# Patient Record
Sex: Female | Born: 1950 | Race: Black or African American | Hispanic: No | Marital: Married | Smoking: Former smoker
Health system: Southern US, Community
[De-identification: ages and names within clinical notes are randomized; demographics above are authoritative.]

## PROBLEM LIST (undated history)

## (undated) DIAGNOSIS — I729 Aneurysm of unspecified site: Secondary | ICD-10-CM

## (undated) DIAGNOSIS — I10 Essential (primary) hypertension: Secondary | ICD-10-CM

## (undated) DIAGNOSIS — G4733 Obstructive sleep apnea (adult) (pediatric): Secondary | ICD-10-CM

## (undated) DIAGNOSIS — E559 Vitamin D deficiency, unspecified: Secondary | ICD-10-CM

## (undated) DIAGNOSIS — R0789 Other chest pain: Principal | ICD-10-CM

## (undated) DIAGNOSIS — Z9989 Dependence on other enabling machines and devices: Secondary | ICD-10-CM

## (undated) DIAGNOSIS — R000 Tachycardia, unspecified: Secondary | ICD-10-CM

## (undated) DIAGNOSIS — M8580 Other specified disorders of bone density and structure, unspecified site: Secondary | ICD-10-CM

## (undated) DIAGNOSIS — M1990 Unspecified osteoarthritis, unspecified site: Secondary | ICD-10-CM

## (undated) DIAGNOSIS — E876 Hypokalemia: Secondary | ICD-10-CM

## (undated) DIAGNOSIS — H269 Unspecified cataract: Secondary | ICD-10-CM

## (undated) DIAGNOSIS — N189 Chronic kidney disease, unspecified: Secondary | ICD-10-CM

## (undated) HISTORY — DX: Hypokalemia: E87.6

## (undated) HISTORY — DX: Vitamin D deficiency, unspecified: E55.9

## (undated) HISTORY — DX: Obstructive sleep apnea (adult) (pediatric): G47.33

## (undated) HISTORY — DX: Other chest pain: R07.89

## (undated) HISTORY — DX: Tachycardia, unspecified: R00.0

## (undated) HISTORY — DX: Essential (primary) hypertension: I10

## (undated) HISTORY — PX: COLONOSCOPY: SHX174

## (undated) HISTORY — DX: Other specified disorders of bone density and structure, unspecified site: M85.80

---
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
## 1998-12-28 ENCOUNTER — Other Ambulatory Visit: Admission: RE | Admit: 1998-12-28 | Discharge: 1998-12-28 | Payer: Self-pay | Admitting: Obstetrics & Gynecology

## 1999-12-27 ENCOUNTER — Emergency Department (HOSPITAL_COMMUNITY): Admission: EM | Admit: 1999-12-27 | Discharge: 1999-12-27 | Payer: Self-pay | Admitting: Internal Medicine

## 2001-07-06 ENCOUNTER — Other Ambulatory Visit: Admission: RE | Admit: 2001-07-06 | Discharge: 2001-07-06 | Payer: Self-pay | Admitting: *Deleted

## 2002-02-17 ENCOUNTER — Encounter: Payer: Self-pay | Admitting: Internal Medicine

## 2002-02-17 ENCOUNTER — Encounter: Admission: RE | Admit: 2002-02-17 | Discharge: 2002-02-17 | Payer: Self-pay | Admitting: Internal Medicine

## 2002-12-19 ENCOUNTER — Other Ambulatory Visit: Admission: RE | Admit: 2002-12-19 | Discharge: 2002-12-19 | Payer: Self-pay | Admitting: Obstetrics & Gynecology

## 2004-04-03 ENCOUNTER — Ambulatory Visit: Payer: Self-pay | Admitting: Internal Medicine

## 2004-10-30 ENCOUNTER — Ambulatory Visit: Payer: Self-pay | Admitting: Internal Medicine

## 2005-08-18 ENCOUNTER — Ambulatory Visit: Payer: Self-pay | Admitting: Internal Medicine

## 2005-08-22 ENCOUNTER — Ambulatory Visit (HOSPITAL_COMMUNITY): Admission: RE | Admit: 2005-08-22 | Discharge: 2005-08-22 | Payer: Self-pay | Admitting: Internal Medicine

## 2006-04-01 ENCOUNTER — Ambulatory Visit: Payer: Self-pay | Admitting: Internal Medicine

## 2006-04-01 ENCOUNTER — Ambulatory Visit (HOSPITAL_COMMUNITY): Admission: RE | Admit: 2006-04-01 | Discharge: 2006-04-01 | Payer: Self-pay | Admitting: Internal Medicine

## 2006-10-13 ENCOUNTER — Ambulatory Visit: Payer: Self-pay | Admitting: Internal Medicine

## 2006-10-13 LAB — CONVERTED CEMR LAB
ALT: 23 units/L (ref 0–40)
AST: 20 units/L (ref 0–37)
Albumin: 3.8 g/dL (ref 3.5–5.2)
Alkaline Phosphatase: 98 units/L (ref 39–117)
BUN: 9 mg/dL (ref 6–23)
Basophils Absolute: 0 10*3/uL (ref 0.0–0.1)
Basophils Relative: 0.1 % (ref 0.0–1.0)
Bilirubin Urine: NEGATIVE
Bilirubin, Direct: 0.1 mg/dL (ref 0.0–0.3)
CO2: 29 meq/L (ref 19–32)
Calcium: 9.4 mg/dL (ref 8.4–10.5)
Chloride: 102 meq/L (ref 96–112)
Cholesterol: 222 mg/dL (ref 0–200)
Creatinine, Ser: 0.9 mg/dL (ref 0.4–1.2)
Crystals: NEGATIVE
Direct LDL: 121.6 mg/dL
Eosinophils Absolute: 0.1 10*3/uL (ref 0.0–0.6)
Eosinophils Relative: 1.9 % (ref 0.0–5.0)
GFR calc Af Amer: 84 mL/min
GFR calc non Af Amer: 69 mL/min
Glucose, Bld: 85 mg/dL (ref 70–99)
HCT: 38.5 % (ref 36.0–46.0)
HDL: 71.2 mg/dL (ref >39.0)
Hemoglobin, Urine: NEGATIVE
Hemoglobin: 13.3 g/dL (ref 12.0–15.0)
Ketones, ur: NEGATIVE mg/dL
Lymphocytes Relative: 34.8 % (ref 12.0–46.0)
MCHC: 34.4 g/dL (ref 30.0–36.0)
MCV: 88.8 fL (ref 78.0–100.0)
Monocytes Absolute: 0.5 10*3/uL (ref 0.2–0.7)
Monocytes Relative: 7.5 % (ref 3.0–11.0)
Mucus, UA: NEGATIVE
Neutro Abs: 3.8 10*3/uL (ref 1.4–7.7)
Neutrophils Relative %: 55.7 % (ref 43.0–77.0)
Nitrite: NEGATIVE
Platelets: 402 10*3/uL — ABNORMAL HIGH (ref 150–400)
Potassium: 3.4 meq/L — ABNORMAL LOW (ref 3.5–5.1)
RBC / HPF: NONE SEEN
RBC: 4.34 M/uL (ref 3.87–5.11)
RDW: 13.6 % (ref 11.5–14.6)
Sodium: 138 meq/L (ref 135–145)
Specific Gravity, Urine: >=1.03 (ref 1.000–1.03)
TSH: 1.36 microintl units/mL (ref 0.35–5.50)
Total Bilirubin: 0.8 mg/dL (ref 0.3–1.2)
Total CHOL/HDL Ratio: 3.1
Total Protein, Urine: NEGATIVE mg/dL
Total Protein: 7.5 g/dL (ref 6.0–8.3)
Triglycerides: 77 mg/dL (ref 0–149)
Urine Glucose: NEGATIVE mg/dL
Urobilinogen, UA: 0.2 (ref 0.0–1.0)
VLDL: 15 mg/dL (ref 0–40)
WBC: 6.7 10*3/uL (ref 4.5–10.5)
pH: 5.5 (ref 5.0–8.0)

## 2007-02-10 ENCOUNTER — Encounter: Payer: Self-pay | Admitting: *Deleted

## 2007-02-10 DIAGNOSIS — T7840XA Allergy, unspecified, initial encounter: Secondary | ICD-10-CM | POA: Insufficient documentation

## 2007-02-10 DIAGNOSIS — D649 Anemia, unspecified: Secondary | ICD-10-CM | POA: Insufficient documentation

## 2007-02-10 DIAGNOSIS — I10 Essential (primary) hypertension: Secondary | ICD-10-CM | POA: Insufficient documentation

## 2007-04-19 ENCOUNTER — Telehealth (INDEPENDENT_AMBULATORY_CARE_PROVIDER_SITE_OTHER): Payer: Self-pay | Admitting: *Deleted

## 2007-04-26 ENCOUNTER — Ambulatory Visit: Payer: Self-pay | Admitting: Internal Medicine

## 2007-04-26 DIAGNOSIS — E7800 Pure hypercholesterolemia, unspecified: Secondary | ICD-10-CM | POA: Insufficient documentation

## 2007-04-26 DIAGNOSIS — F411 Generalized anxiety disorder: Secondary | ICD-10-CM | POA: Insufficient documentation

## 2007-04-26 DIAGNOSIS — K219 Gastro-esophageal reflux disease without esophagitis: Secondary | ICD-10-CM | POA: Insufficient documentation

## 2007-04-26 DIAGNOSIS — M79609 Pain in unspecified limb: Secondary | ICD-10-CM | POA: Insufficient documentation

## 2007-04-26 DIAGNOSIS — D509 Iron deficiency anemia, unspecified: Secondary | ICD-10-CM | POA: Insufficient documentation

## 2007-04-26 DIAGNOSIS — E785 Hyperlipidemia, unspecified: Secondary | ICD-10-CM | POA: Insufficient documentation

## 2007-10-08 ENCOUNTER — Emergency Department (HOSPITAL_COMMUNITY): Admission: EM | Admit: 2007-10-08 | Discharge: 2007-10-09 | Payer: Self-pay | Admitting: Emergency Medicine

## 2007-10-24 ENCOUNTER — Encounter: Admission: RE | Admit: 2007-10-24 | Discharge: 2007-10-24 | Payer: Self-pay | Admitting: Internal Medicine

## 2007-10-25 ENCOUNTER — Encounter: Payer: Self-pay | Admitting: Internal Medicine

## 2008-06-28 ENCOUNTER — Encounter: Payer: Self-pay | Admitting: Cardiovascular Disease

## 2008-10-04 ENCOUNTER — Encounter: Payer: Self-pay | Admitting: Cardiovascular Disease

## 2009-10-27 ENCOUNTER — Emergency Department (HOSPITAL_COMMUNITY): Admission: EM | Admit: 2009-10-27 | Discharge: 2009-10-27 | Payer: Self-pay | Admitting: Emergency Medicine

## 2009-10-28 ENCOUNTER — Inpatient Hospital Stay (HOSPITAL_COMMUNITY): Admission: EM | Admit: 2009-10-28 | Discharge: 2009-10-29 | Payer: Self-pay | Admitting: Emergency Medicine

## 2010-05-21 NOTE — Progress Notes (Signed)
       21  22  23  24  25   Phone Note Call from Patient Call back at Home Phone [phone]   Caller: Patient Summary of Call: [facility] a generic brand [facility] [address] [phone] Initial call taken by: Shelbie Proctor,  April 19, 2007 3:23 PM  Follow-up for Phone Call        needs rov Follow-up by: Corwin Levins MD,  April 19, 2007 4:10 PM  Additional Follow-up for Phone Call Additional follow up Details #1::        called pt to inform an appt is need ov appt made for jan 5 spoke with pt Additional Follow-up by: Shelbie Proctor,  April 19, 2007 4:25 PM

## 2010-05-21 NOTE — Assessment & Plan Note (Signed)
Summary: rov for med adjustment on diovan dd  Medications Added DICLOFENAC SODIUM 75 MG TBEC (DICLOFENAC SODIUM) Take 1 tablet by mouth twice a day DIOVAN HCT 160-12.5 MG TABS (VALSARTAN-HYDROCHLOROTHIAZIDE)  CLARINEX 5 MG  TABS (DESLORATADINE) 1 by mouth once daily prn ECOTRIN LOW STRENGTH 81 MG  TBEC (ASPIRIN) 1 by mouth qd LISINOPRIL-HYDROCHLOROTHIAZIDE 20-12.5 MG  TABS (LISINOPRIL-HYDROCHLOROTHIAZIDE) 1 by mouth qd      Allergies Added: NKDA  Vital Signs:  Patient Profile:   60 Years Old Female Weight:      185 pounds Temp:     97.1 degrees F oral Pulse rate:   84 / minute BP sitting:   135 / 89  (right arm) Cuff size:   regular  Pt. in pain?   no  Vitals Entered By: Maris Berger (April 26, 2007 8:38 AM)                  Chief Complaint:  med adjustments.  History of Present Illness: here b/c [employer] is making the diovan hct a $60 co-pay; overall doing well; also with several weeks for right LBP with recurrent RLE discomfort radiating below the knee, overall improved over the past wk without weakness or significant pain  Current Allergies (reviewed today): No known allergies  Updated/Current Medications (including changes made in today's visit):  CLARINEX 5 MG  TABS (DESLORATADINE) 1 by mouth once daily prn ECOTRIN LOW STRENGTH 81 MG  TBEC (ASPIRIN) 1 by mouth qd LISINOPRIL-HYDROCHLOROTHIAZIDE 20-12.5 MG  TABS (LISINOPRIL-HYDROCHLOROTHIAZIDE) 1 by mouth qd   Past Medical History:    Reviewed history from 02/10/2007 and no changes required:       ALLERGY (ICD-995.3)       HYPERTENSION (ICD-401.9)       ANEMIA (ICD-285.9)       Hyperlipidemia       GERD       hx of uterine fibroids       DJD       Anemia-iron deficiency       Anxiety  Past Surgical History:    Reviewed history and no changes required:       s/p benign breast biopsy   Family History:    Reviewed history and no changes required:       Family History Diabetes 1st degree  relative  Social History:    Reviewed history and no changes required:       Never Smoked       Alcohol use-no       married       no children   Risk Factors:  Tobacco use:  never Alcohol use:  no    Physical Exam  General:     Well-developed,well-nourished,in no acute distress; alert,appropriate and cooperative throughout examination Head:     Normocephalic and atraumatic without obvious abnormalities. No apparent alopecia or balding. Eyes:     No corneal or conjunctival inflammation noted. EOMI. Perrla. Nose:     External nasal examination shows no deformity or inflammation. Nasal mucosa are pink and moist without lesions or exudates. Mouth:     Oral mucosa and oropharynx without lesions or exudates.  Teeth in good repair. Neck:     No deformities, masses, or tenderness noted. Lungs:     Normal respiratory effort, chest expands symmetrically. Lungs are clear to auscultation, no crackles or wheezes. Heart:     Normal rate and regular rhythm. S1 and S2 normal without gallop, murmur, click, rub or other extra sounds.  Msk:     No deformity or scoliosis noted of thoracic or lumbar spine.   Extremities:     no edema Neurologic:     No cranial nerve deficits noted. Station and gait are normal. Plantar reflexes are down-going bilaterally. DTRs are symmetrical throughout. Sensory, motor and coordinative functions appear intact.    Impression & Recommendations:  Problem # 1:  HYPERTENSION (ICD-401.9)  The following medications were removed from the medication list:    Diovan Hct 160-12.5 Mg Tabs (Valsartan-hydrochlorothiazide)  Her updated medication list for this problem includes:    Lisinopril-hydrochlorothiazide 20-12.5 Mg Tabs (Lisinopril-hydrochlorothiazide) .Marland Kitchen... 1 by mouth qd  tx as above  Problem # 2:  LEG PAIN, RIGHT (ICD-729.5) c/w sciatica most likely, no wimproved and exam benign, educated and reassured, will cont to follow  Problem # 3:  ANXIETY  (ICD-300.00) o/w stable, cont off meds for now  Complete Medication List: 1)  Clarinex 5 Mg Tabs (Desloratadine) .Marland Kitchen.. 1 by mouth once daily prn 2)  Ecotrin Low Strength 81 Mg Tbec (Aspirin) .Marland Kitchen.. 1 by mouth qd 3)  Lisinopril-hydrochlorothiazide 20-12.5 Mg Tabs (Lisinopril-hydrochlorothiazide) .Marland Kitchen.. 1 by mouth qd   Patient Instructions: 1)  Check your Blood Pressure regularly. If it is above 140/90: you should make an appointment. 2)  Please schedule a follow-up appointment in 6 monthswith CPX labs 3)  Check your Blood Pressure regularly. If it is above 140/90: you should make an appointment.    Prescriptions: LISINOPRIL-HYDROCHLOROTHIAZIDE 20-12.5 MG  TABS (LISINOPRIL-HYDROCHLOROTHIAZIDE) 1 by mouth qd  #30 x 11   Entered and Authorized by:   Corwin Levins MD   Signed by:   Corwin Levins MD on 04/26/2007   Method used:   Electronically sent to ...       [facility]  [phone]*       [address]       [place], [place]  [address]       Ph: [phone] or [phone]       Fax: [phone]   RxID:   [id number]  ]

## 2010-05-21 NOTE — Miscellaneous (Signed)
Summary: Orders Update  Clinical Lists Changes  Orders: Added new Service order of No Show NS50 (NS50) - Signed 

## 2010-07-07 LAB — DIFFERENTIAL
Basophils Absolute: 0 10*3/uL (ref 0.0–0.1)
Basophils Relative: 0 % (ref 0–1)
Eosinophils Absolute: 0.1 10*3/uL (ref 0.0–0.7)
Eosinophils Relative: 3 % (ref 0–5)
Lymphocytes Relative: 37 % (ref 12–46)
Lymphs Abs: 1.6 10*3/uL (ref 0.7–4.0)
Monocytes Absolute: 0.4 10*3/uL (ref 0.1–1.0)
Monocytes Relative: 10 % (ref 3–12)
Neutro Abs: 2.2 10*3/uL (ref 1.7–7.7)
Neutrophils Relative %: 50 % (ref 43–77)

## 2010-07-07 LAB — CK TOTAL AND CKMB (NOT AT ARMC)
CK, MB: 0.7 ng/mL (ref 0.3–4.0)
CK, MB: 0.7 ng/mL (ref 0.3–4.0)
CK, MB: 0.7 ng/mL (ref 0.3–4.0)
Relative Index: INVALID (ref 0.0–2.5)
Relative Index: INVALID (ref 0.0–2.5)
Relative Index: INVALID (ref 0.0–2.5)
Total CK: 60 U/L (ref 7–177)
Total CK: 62 U/L (ref 7–177)
Total CK: 68 U/L (ref 7–177)

## 2010-07-07 LAB — CBC
HCT: 34.2 % — ABNORMAL LOW (ref 36.0–46.0)
HCT: 36.8 % (ref 36.0–46.0)
Hemoglobin: 11.4 g/dL — ABNORMAL LOW (ref 12.0–15.0)
Hemoglobin: 12.5 g/dL (ref 12.0–15.0)
MCH: 30.4 pg (ref 26.0–34.0)
MCH: 31.3 pg (ref 26.0–34.0)
MCHC: 33.3 g/dL (ref 30.0–36.0)
MCHC: 34 g/dL (ref 30.0–36.0)
MCV: 91.2 fL (ref 78.0–100.0)
MCV: 92 fL (ref 78.0–100.0)
Platelets: 203 10*3/uL (ref 150–400)
Platelets: 213 10*3/uL (ref 150–400)
RBC: 3.75 MIL/uL — ABNORMAL LOW (ref 3.87–5.11)
RBC: 4 MIL/uL (ref 3.87–5.11)
RDW: 14.4 % (ref 11.5–15.5)
RDW: 14.5 % (ref 11.5–15.5)
WBC: 4.1 10*3/uL (ref 4.0–10.5)
WBC: 4.3 10*3/uL (ref 4.0–10.5)

## 2010-07-07 LAB — BASIC METABOLIC PANEL
BUN: 8 mg/dL (ref 6–23)
BUN: 9 mg/dL (ref 6–23)
CO2: 26 mEq/L (ref 19–32)
CO2: 28 mEq/L (ref 19–32)
Calcium: 9.2 mg/dL (ref 8.4–10.5)
Calcium: 9.3 mg/dL (ref 8.4–10.5)
Chloride: 106 mEq/L (ref 96–112)
Chloride: 107 mEq/L (ref 96–112)
Creatinine, Ser: 0.95 mg/dL (ref 0.4–1.2)
Creatinine, Ser: 1.02 mg/dL (ref 0.4–1.2)
GFR calc Af Amer: >60 mL/min (ref >60)
GFR calc Af Amer: >60 mL/min (ref >60)
GFR calc non Af Amer: 56 mL/min — ABNORMAL LOW (ref >60)
GFR calc non Af Amer: >60 mL/min (ref >60)
Glucose, Bld: 91 mg/dL (ref 70–99)
Glucose, Bld: 92 mg/dL (ref 70–99)
Potassium: 3.8 mEq/L (ref 3.5–5.1)
Potassium: 3.9 mEq/L (ref 3.5–5.1)
Sodium: 137 mEq/L (ref 135–145)
Sodium: 142 mEq/L (ref 135–145)

## 2010-07-07 LAB — LIPID PANEL
Cholesterol: 153 mg/dL (ref 0–200)
HDL: 60 mg/dL (ref >39)
LDL Cholesterol: 82 mg/dL (ref 0–99)
Total CHOL/HDL Ratio: 2.6 RATIO
Triglycerides: 53 mg/dL (ref <150)
VLDL: 11 mg/dL (ref 0–40)

## 2010-07-07 LAB — TROPONIN I
Troponin I: 0.01 ng/mL (ref 0.00–0.06)
Troponin I: <0.01 ng/mL (ref 0.00–0.06)
Troponin I: <0.01 ng/mL (ref 0.00–0.06)

## 2010-07-07 LAB — POCT CARDIAC MARKERS
CKMB, poc: <1 ng/mL — ABNORMAL LOW (ref 1.0–8.0)
Myoglobin, poc: 71.4 ng/mL (ref 12–200)
Troponin i, poc: <0.05 ng/mL (ref 0.00–0.09)

## 2010-07-07 LAB — TSH: TSH: 2.814 u[IU]/mL (ref 0.350–4.500)

## 2010-11-07 ENCOUNTER — Other Ambulatory Visit: Payer: Self-pay | Admitting: Neurosurgery

## 2010-11-07 DIAGNOSIS — I729 Aneurysm of unspecified site: Secondary | ICD-10-CM

## 2010-11-09 ENCOUNTER — Other Ambulatory Visit: Payer: Self-pay

## 2010-12-02 ENCOUNTER — Ambulatory Visit
Admission: RE | Admit: 2010-12-02 | Discharge: 2010-12-02 | Disposition: A | Discharge status: Home / Self Care | Payer: Federal, State, Local not specified - PPO | Source: Ambulatory Visit | Attending: Neurosurgery | Admitting: Neurosurgery

## 2010-12-02 DIAGNOSIS — I729 Aneurysm of unspecified site: Secondary | ICD-10-CM

## 2011-01-16 LAB — COMPREHENSIVE METABOLIC PANEL
ALT: 26
AST: 20
Albumin: 3.6
Alkaline Phosphatase: 94
BUN: 15
CO2: 26
Calcium: 9.3
Chloride: 108
Creatinine, Ser: 1.01
GFR calc Af Amer: >60
GFR calc non Af Amer: 57 — ABNORMAL LOW
Glucose, Bld: 99
Potassium: 3.8
Sodium: 141
Total Bilirubin: 0.5
Total Protein: 6.3

## 2011-01-16 LAB — CBC
HCT: 36.7
Hemoglobin: 12.5
MCHC: 34
MCV: 89.5
Platelets: 278
RBC: 4.1
RDW: 14.7
WBC: 5.8

## 2011-01-16 LAB — DIFFERENTIAL
Basophils Absolute: 0
Basophils Relative: 0
Eosinophils Absolute: 0.2
Eosinophils Relative: 3
Lymphocytes Relative: 29
Lymphs Abs: 1.7
Monocytes Absolute: 0.6
Monocytes Relative: 10
Neutro Abs: 3.3
Neutrophils Relative %: 58

## 2011-01-16 LAB — POCT CARDIAC MARKERS
CKMB, poc: <1 — ABNORMAL LOW
Myoglobin, poc: 53.9
Operator id: 294511
Troponin i, poc: <0.05

## 2011-02-07 ENCOUNTER — Other Ambulatory Visit: Payer: Self-pay | Admitting: Cardiovascular Disease

## 2011-02-10 ENCOUNTER — Other Ambulatory Visit: Payer: Self-pay | Admitting: *Deleted

## 2011-02-11 MED ORDER — HYDROCHLOROTHIAZIDE 25 MG PO TABS: 25.0000 mg | ORAL_TABLET | Freq: Every day | ORAL | Status: DC

## 2011-02-11 NOTE — Telephone Encounter (Signed)
Fax Received. Refill Completed. Deann Mclaine Chowoe (M.A), Pt needs appointment then refill can be made   

## 2011-08-27 ENCOUNTER — Encounter: Payer: Self-pay | Admitting: *Deleted

## 2011-10-15 ENCOUNTER — Other Ambulatory Visit: Payer: Self-pay | Admitting: Neurosurgery

## 2011-10-15 DIAGNOSIS — I729 Aneurysm of unspecified site: Secondary | ICD-10-CM

## 2011-11-27 ENCOUNTER — Other Ambulatory Visit: Payer: Federal, State, Local not specified - PPO

## 2011-12-01 ENCOUNTER — Ambulatory Visit
Admission: RE | Admit: 2011-12-01 | Discharge: 2011-12-01 | Disposition: A | Discharge status: Home / Self Care | Payer: Federal, State, Local not specified - PPO | Source: Ambulatory Visit | Attending: Neurosurgery | Admitting: Neurosurgery

## 2011-12-01 DIAGNOSIS — I729 Aneurysm of unspecified site: Secondary | ICD-10-CM

## 2013-07-19 ENCOUNTER — Other Ambulatory Visit (HOSPITAL_COMMUNITY)
Admission: RE | Admit: 2013-07-19 | Discharge: 2013-07-19 | Disposition: A | Discharge status: Home / Self Care | Payer: Federal, State, Local not specified - PPO | Source: Ambulatory Visit | Attending: Internal Medicine | Admitting: Internal Medicine

## 2013-07-19 DIAGNOSIS — Z01419 Encounter for gynecological examination (general) (routine) without abnormal findings: Secondary | ICD-10-CM | POA: Insufficient documentation

## 2014-12-12 ENCOUNTER — Encounter: Payer: Self-pay | Admitting: Cardiovascular Disease

## 2015-05-22 ENCOUNTER — Other Ambulatory Visit (HOSPITAL_COMMUNITY): Payer: Self-pay | Admitting: Internal Medicine

## 2015-05-22 DIAGNOSIS — N183 Chronic kidney disease, stage 3 unspecified: Secondary | ICD-10-CM

## 2015-05-22 DIAGNOSIS — M6281 Muscle weakness (generalized): Secondary | ICD-10-CM

## 2015-05-22 DIAGNOSIS — I998 Other disorder of circulatory system: Secondary | ICD-10-CM

## 2015-05-22 DIAGNOSIS — I671 Cerebral aneurysm, nonruptured: Secondary | ICD-10-CM

## 2015-05-23 ENCOUNTER — Ambulatory Visit (HOSPITAL_COMMUNITY)
Admission: RE | Admit: 2015-05-23 | Discharge: 2015-05-23 | Disposition: A | Discharge status: Home / Self Care | Payer: Federal, State, Local not specified - PPO | Source: Ambulatory Visit | Attending: Internal Medicine | Admitting: Internal Medicine

## 2015-05-23 ENCOUNTER — Other Ambulatory Visit (HOSPITAL_COMMUNITY): Payer: Self-pay | Admitting: Internal Medicine

## 2015-05-23 DIAGNOSIS — R531 Weakness: Secondary | ICD-10-CM | POA: Insufficient documentation

## 2015-05-23 DIAGNOSIS — I671 Cerebral aneurysm, nonruptured: Secondary | ICD-10-CM | POA: Diagnosis present

## 2015-05-23 DIAGNOSIS — N183 Chronic kidney disease, stage 3 unspecified: Secondary | ICD-10-CM

## 2015-05-23 DIAGNOSIS — G459 Transient cerebral ischemic attack, unspecified: Secondary | ICD-10-CM | POA: Diagnosis not present

## 2015-05-23 DIAGNOSIS — R200 Anesthesia of skin: Secondary | ICD-10-CM | POA: Diagnosis not present

## 2015-05-23 DIAGNOSIS — M6281 Muscle weakness (generalized): Secondary | ICD-10-CM

## 2015-05-23 DIAGNOSIS — I998 Other disorder of circulatory system: Secondary | ICD-10-CM

## 2015-05-24 ENCOUNTER — Ambulatory Visit (INDEPENDENT_AMBULATORY_CARE_PROVIDER_SITE_OTHER): Payer: Federal, State, Local not specified - PPO | Admitting: Neurology

## 2015-05-24 ENCOUNTER — Encounter: Payer: Self-pay | Admitting: Neurology

## 2015-05-24 VITALS — BP 130/75 | HR 60 | Ht 64.0 in | Wt 184.2 lb

## 2015-05-24 DIAGNOSIS — R531 Weakness: Secondary | ICD-10-CM

## 2015-05-24 NOTE — Progress Notes (Signed)
[facility]    Provider:  Dr Jaynee Eagles Referring Provider: Biagio Borg, MD Primary Care Physician:  Horatio Pel, MD  CC:  Episodic right-sided weakness  HPI:  Christine Steele is a 65 y.o. female here as a referral from Dr. Jenny Reichmann for right-sided feelings of numbness and weakness however she denies actually being weak, just feels like it. PMHx of HTN, untreated OSA, stable small mca aneurysm.  She has been having symptoms since 2011, husband endorses she has had these symptoms for years.For a long time her blood pressure would be very low when these episodes would happen but that has not happened since they changed her medications  She has had several MRIs and MRAs and they have been stable. She feels weak and numb on the right side. Mostly at night when she gets home and lays on the couch. She says that she works hard without a break. Started again on Sunday night after she took the metoprolol and laid down and that is when she started feeling bad. It may have happened after taking the metoprolol. She has had problems with BP and HR fluctuating. She never wakes up with the weakness. She starts feeling very weak on the right side from the face down to the leg. She has had episodes of low blood pressure in the past, but she checked her BP and it was fine 133  But last night symptoms were on the left side. The right side of the face feels funny. Her face feels funny but denies actual weakness or facial droop. Her husband doesn't notice a difference or sees any weakness. Patient goes and lays down. She can use all her limbs, no weakness but she just feels weak. No vision changes, no speech problems, when she is driving she feels fatigued. Symptoms are not that often. It happens when she is tired or stressed. Majority of time it is on the right side but it is sometimes on the left as well. She feels funny in the stomach. Husband is here, says she doesn't eat any meat and she eats mostly  vegetables. Happened Sunday nioght, Thursday night, Friday night. Each time it was after she had lays down and had already taken her medicine. She can't give me details. She has a funny feeling on her head like water running inside her head.  She doesn't take her pulse. Husband says a lot of things in the family have happened and she worries.   Reviewed notes, labs and imaging from outside physicians, which showed:  Personally reviewed MRI of the brain/MRA of the head 05/2015 and agree with the following. They are normal for her age:  IMPRESSION: 1. No acute intracranial abnormality. 2. Unchanged, minimal cerebral white matter disease which is nonspecific but may reflect the sequelae of chronic small vessel ischemia. 3. Unchanged 2 mm right MCA aneurysm. 4. No major intracranial arterial occlusion or significant stenosis.  CK nml  Review of Systems: Patient complains of symptoms per HPI as well as the following symptoms: swelling in hands, blured vision, eye pain, joint pain, numbness, weakness, snoring, joint pain, allergies. Pertinent negatives per HPI. All others negative.   Social History   Social History  . Marital Status: Married    Spouse Name: Trilby Drummer  . Number of Children: 0  . Years of Education: 12+   Occupational History  . Not on file.   Social History Main Topics  . Smoking status: Former Smoker -- 1.00 packs/day for 5 years  Types: Cigarettes    Quit date: 04/22/1979  . Smokeless tobacco: Not on file  . Alcohol Use: No  . Drug Use: No  . Sexual Activity: Not on file   Other Topics Concern  . Not on file   Social History Narrative   Lives with husband   Caffeine use: none    Family History  Problem Relation Age of Onset  . Hypertension Mother   . Heart attack Father   . Hypertension Father   . Stroke Father     Past Medical History  Diagnosis Date  . Hypertension   . Tachycardia   . Potassium (K) deficiency   . Vitamin D deficiency     Past  Surgical History  Procedure Laterality Date  . Biopsy breast  1970    Right Breast    Current Outpatient Prescriptions  Medication Sig Dispense Refill  . aspirin 325 MG tablet Take 325 mg by mouth daily.    . bisoprolol (ZEBETA) 5 MG tablet Take 5 mg by mouth at bedtime.    . Cholecalciferol (VITAMIN D PO) Take 2,000 Units by mouth daily.    . irbesartan-hydrochlorothiazide (AVALIDE) 300-12.5 MG tablet Take 1 tablet by mouth daily.    . Multiple Vitamins-Minerals (MULTIVITAMIN ADULT PO) Take 1 tablet by mouth daily.     No current facility-administered medications for this visit.    Allergies as of 05/24/2015 - Review Complete 05/24/2015  Allergen Reaction Noted  . Amlodipine  05/24/2015    Vitals: BP 130/75 mmHg  Pulse 60  Ht 5\' 4"  (1.626 m)  Wt 184 lb 3.2 oz (83.553 kg)  BMI 31.60 kg/m2 Last Weight:  Wt Readings from Last 1 Encounters:  05/24/15 184 lb 3.2 oz (83.553 kg)   Last Height:   Ht Readings from Last 1 Encounters:  05/24/15 5\' 4"  (1.626 m)   Physical exam: Exam: Gen: NAD, conversant, well nourised, obese, well groomed                     CV: RRR, no MRG. No Carotid Bruits. No peripheral edema, warm, nontender Eyes: Conjunctivae clear without exudates or hemorrhage  Neuro: Detailed Neurologic Exam  Speech:    Speech is normal; fluent and spontaneous with normal comprehension.  Cognition:    The patient is oriented to person, place, and time;     recent and remote memory intact;     language fluent;     normal attention, concentration,     fund of knowledge Cranial Nerves:    The pupils are equal, round, and reactive to light. The fundi are normal and spontaneous venous pulsations are present. Visual fields are full to finger confrontation. Extraocular movements are intact. Trigeminal sensation is intact and the muscles of mastication are normal. The face is symmetric. The palate elevates in the midline. Hearing intact. Voice is normal. Shoulder shrug is  normal. The tongue has normal motion without fasciculations.   Coordination:    Normal finger to nose and heel to shin. Normal rapid alternating movements.   Gait:    Heel-toe and tandem gait are normal.   Motor Observation:    No asymmetry, no atrophy, and no involuntary movements noted. Tone:    Normal muscle tone.    Posture:    Posture is normal. normal erect    Strength:    Strength is V/V in the upper and lower limbs.      Sensation: intact to LT     Reflex Exam:  DTR's:    Deep tendon reflexes in the upper and lower extremities are normal bilaterally.   Toes:    The toes are downgoing bilaterally.   Clonus:    Clonus is absent.       Assessment/Plan:  Patient with 6 years of occasional unilateral mild feeling of weakness, mostly right side but can also be on the left side too. Mostly at night when she is tired and possibly stressed. She has had multiple MRIs over the years that have been unremarkable and normal for her age, stable small right MCA aneurysm. These episodes only happen at night when she lays down. Neuro exam is normal. I discussed with patient and her husband, could MRI her neck but doesn't fit for cervical stenosis or cervical cord lesion, still wiling to verify. The symptoms have occurred on both sides so would be unusual for seizure but offered an EEG. Unusual for TIAs. Happy to do more workup but patient and her husband decline. Husband has never noticed any of the symptoms she describes even if he is with her at the time. She does endorse that it may be medication and in the past the symptoms have been exacerbated by changes in BP and pulse. I recommended keeping a diary of when the symptoms occur, how long they last, what medications or food she takes before the incident, what she was doing that day and how fatigued she feels, and check both blood pressure and pulse laying and standing. She is mildly orthostatic in the office today. Advised to drink plenty  of fluids.  Follow up in 6 months.  Laying    130/75   Pulse 60 Sitting     115/72   Pulse 59 Standing 124/70   Pulse 52  Sarina Ill, MD  [facility] [address] [place], [place] [address]  Phone [phone] Fax [phone]

## 2015-05-24 NOTE — Patient Instructions (Signed)
Overall you are doing very well but I do want to suggest a few things today:   Remember to drink plenty of fluid, eat healthy meals and do not skip any meals. Try to eat protein with a every meal and eat a healthy snack such as fruit or nuts in between meals. Try to keep a regular sleep-wake schedule and try to exercise daily, particularly in the form of walking, 20-30 minutes a day, if you can.   I would like to see you back in 6 months, sooner if we need to. Please call us with any interim questions, concerns, problems, updates or refill requests.   Our phone number is [phone]. We also have an after hours call service for urgent matters and there is a physician on-call for urgent questions. For any emergencies you know to call 911 or go to the nearest emergency room   

## 2015-05-26 DIAGNOSIS — R531 Weakness: Secondary | ICD-10-CM | POA: Insufficient documentation

## 2015-11-13 ENCOUNTER — Encounter: Payer: Self-pay | Admitting: Obstetrics & Gynecology

## 2015-11-21 ENCOUNTER — Ambulatory Visit: Payer: Federal, State, Local not specified - PPO | Admitting: Neurology

## 2015-11-28 ENCOUNTER — Encounter: Payer: Self-pay | Admitting: Neurology

## 2015-11-28 ENCOUNTER — Ambulatory Visit (INDEPENDENT_AMBULATORY_CARE_PROVIDER_SITE_OTHER): Payer: Federal, State, Local not specified - PPO | Admitting: Neurology

## 2015-11-28 VITALS — BP 130/80 | HR 64 | Ht 64.0 in | Wt 178.2 lb

## 2015-11-28 DIAGNOSIS — E538 Deficiency of other specified B group vitamins: Secondary | ICD-10-CM | POA: Diagnosis not present

## 2015-11-28 DIAGNOSIS — R5382 Chronic fatigue, unspecified: Secondary | ICD-10-CM | POA: Diagnosis not present

## 2015-11-28 DIAGNOSIS — R202 Paresthesia of skin: Secondary | ICD-10-CM

## 2015-11-28 DIAGNOSIS — R7309 Other abnormal glucose: Secondary | ICD-10-CM

## 2015-11-28 DIAGNOSIS — R531 Weakness: Secondary | ICD-10-CM | POA: Diagnosis not present

## 2015-11-28 NOTE — Progress Notes (Signed)
[id number]    Provider:  Dr Jaynee Eagles Referring Provider: [name], MD Primary Care Physician:  Horatio Pel, MD  CC:  Episodic right-sided weakness  Interval update 11/28/2015:  She returns for follow up. She has not really had anymore episode. Her blood pressure was elevated and her heart rate was low. It made her feel bad. She had some right sided weakness and she went to bed and it resolved. She not drink water consistently. She was minimally orthostatic last appointment. She often doesn;t drink any water during the day. At night she drinks more. With the weakness she doesn't feel dizzy or lightheaded but helps to lay down. Sheis more tired at the end of the day. No double vision, blurry vision, eyelid drooping. Will check labs for myasthenia gravis. She feels tingling in the feet and arm and radiates down the entire right side. She describes funny paresthesias.   HPI:  Christine Steele is a 65 y.o. female here as a referral from Dr. Jenny Reichmann for right-sided feelings of numbness and weakness however she denies actually being weak, just feels like it. PMHx of HTN, untreated OSA, stable small mca aneurysm.  She has been having symptoms since 2011, husband endorses she has had these symptoms for years.For a long time her blood pressure would be very low when these episodes would happen but that has not happened since they changed her medications  She has had several MRIs and MRAs and they have been stable. She feels weak and numb on the right side. Mostly at night when she gets home and lays on the couch. She says that she works hard without a break. Started again on Sunday night after she took the metoprolol and laid down and that is when she started feeling bad. It may have happened after taking the metoprolol. She has had problems with BP and HR fluctuating. She never wakes up with the weakness. She starts feeling very weak on the right side from the face down to the leg. She  has had episodes of low blood pressure in the past, but she checked her BP and it was fine 133  But last night symptoms were on the left side. The right side of the face feels funny. Her face feels funny but denies actual weakness or facial droop. Her husband doesn't notice a difference or sees any weakness. Patient goes and lays down. She can use all her limbs, no weakness but she just feels weak. No vision changes, no speech problems, when she is driving she feels fatigued. Symptoms are not that often. It happens when she is tired or stressed. Majority of time it is on the right side but it is sometimes on the left as well. She feels funny in the stomach. Husband is here, says she doesn't eat any meat and she eats mostly vegetables. Happened Sunday nioght, Thursday night, Friday night. Each time it was after she had lays down and had already taken her medicine. She can't give me details. She has a funny feeling on her head like water running inside her head.  She doesn't take her pulse. Husband says a lot of things in the family have happened and she worries.   Reviewed notes, labs and imaging from outside physicians, which showed:  Personally reviewed MRI of the brain/MRA of the head 05/2015 and agree with the following. They are normal for her age:  IMPRESSION: 1. No acute intracranial abnormality. 2. Unchanged, minimal cerebral white matter disease which is  nonspecific but may reflect the sequelae of chronic small vessel ischemia. 3. Unchanged 2 mm right MCA aneurysm. 4. No major intracranial arterial occlusion or significant stenosis.  CK nml  Review of Systems: Patient complains of symptoms per HPI as well as the following symptoms: swelling in hands, blured vision, eye pain, joint pain, numbness, weakness, snoring, joint pain, allergies. Pertinent negatives per HPI. All others negative.    Social History   Social History  . Marital status: Married    Spouse name: Trilby Drummer  . Number  of children: 0  . Years of education: 12+   Occupational History  . Not on file.   Social History Main Topics  . Smoking status: Former Smoker    Packs/day: 1.00    Years: 5.00    Types: Cigarettes    Quit date: 04/22/1979  . Smokeless tobacco: Not on file  . Alcohol use No  . Drug use: No  . Sexual activity: Not on file   Other Topics Concern  . Not on file   Social History Narrative   Lives with husband   Caffeine use: none    Family History  Problem Relation Age of Onset  . Hypertension Mother   . Heart attack Father   . Hypertension Father   . Stroke Father     Past Medical History:  Diagnosis Date  . Hypertension   . Potassium (K) deficiency   . Tachycardia   . Vitamin D deficiency     Past Surgical History:  Procedure Laterality Date  . BIOPSY BREAST  1970   Right Breast    Current Outpatient Prescriptions  Medication Sig Dispense Refill  . aspirin 325 MG tablet Take 325 mg by mouth daily.    . bisoprolol (ZEBETA) 5 MG tablet Take 2.5 mg by mouth at bedtime.     . Cholecalciferol (VITAMIN D PO) Take 2,000 Units by mouth daily.    . irbesartan-hydrochlorothiazide (AVALIDE) 300-12.5 MG tablet Take 0.5 tablets by mouth daily.     . Multiple Vitamins-Minerals (MULTIVITAMIN ADULT PO) Take 1 tablet by mouth daily.     No current facility-administered medications for this visit.     Allergies as of 11/28/2015 - Review Complete 11/28/2015  Allergen Reaction Noted  . Amlodipine  05/24/2015    Vitals: BP 130/80 (BP Location: Left Arm, Patient Position: Sitting)   Pulse 64   Ht 5\' 4"  (1.626 m)   Wt 178 lb 3.2 oz (80.8 kg)   SpO2 98%   BMI 30.59 kg/m  Last Weight:  Wt Readings from Last 1 Encounters:  11/28/15 178 lb 3.2 oz (80.8 kg)   Last Height:   Ht Readings from Last 1 Encounters:  11/28/15 5\' 4"  (1.626 m)   Speech:    Speech is normal; fluent and spontaneous with normal comprehension.  Cognition:    The patient is oriented to person,  place, and time;     recent and remote memory intact;     language fluent;     normal attention, concentration,     fund of knowledge Cranial Nerves:    The pupils are equal, round, and reactive to light. The fundi are normal and spontaneous venous pulsations are present. Visual fields are full to finger confrontation. Extraocular movements are intact. Trigeminal sensation is intact and the muscles of mastication are normal. The face is symmetric. The palate elevates in the midline. Hearing intact. Voice is normal. Shoulder shrug is normal. The tongue has normal motion without fasciculations.  Coordination:    Normal finger to nose and heel to shin. Normal rapid alternating movements.   Gait:    Heel-toe and tandem gait are normal.   Motor Observation:    No asymmetry, no atrophy, and no involuntary movements noted. Tone:    Normal muscle tone.    Posture:    Posture is normal. normal erect    Strength:    Strength is V/V in the upper and lower limbs.      Sensation: intact to LT     Reflex Exam:  DTR's:    Deep tendon reflexes in the upper and lower extremities are normal bilaterally.   Toes:    The toes are downgoing bilaterally.   Clonus:    Clonus is absent.       Assessment/Plan:  Patient with 6 years of occasional unilateral mild feeling of weakness, mostly right side but can also be on the left side too. Mostly at night when she is tired and possibly stressed. She has had multiple MRIs over the years that have been unremarkable and normal for her age, stable small right MCA aneurysm. These episodes only happen at night when she lays down. Neuro exam is normal. I discussed with patient and her husband, could MRI her neck but doesn't fit for cervical stenosis or cervical cord lesion, still wiling to verify. The symptoms have occurred on both sides so would be unusual for seizure but offered an EEG. Unusual for TIAs. Happy to do more workup but patient and her  husband decline. Husband has never noticed any of the symptoms she describes even if he is with her at the time. She does endorse that it may be medication and in the past the symptoms have been exacerbated by changes in BP and pulse. I recommended keeping a diary of when the symptoms occur, how long they last, what medications or food she takes before the incident, what she was doing that day and how fatigued she feels, and check both blood pressure and pulse laying and standing. She is mildly orthostatic in the office today. Advised to drink plenty of fluids.  Follow up in 6 months.  Will order labs today such as antibodies for myasthenia gravis, also reprots tingling, numbness with check a serum panel such as b12, tsh for fatigue, ck for muscle enzymes and others    Sarina Ill, MD  [facility] [address] [place], [place] [address]  Phone [phone] Fax [phone]  A total of 30 minutes was spent face-to-face with this patient. Over half this time was spent on counseling patient on the weakness diagnosis and different diagnostic and therapeutic options available.

## 2015-11-28 NOTE — Patient Instructions (Signed)
Overall you are doing fairly well but I do want to suggest a few things today:   Remember to drink plenty of fluid, eat healthy meals and do not skip any meals. Try to eat protein with a every meal and eat a healthy snack such as fruit or nuts in between meals. Try to keep a regular sleep-wake schedule and try to exercise daily, particularly in the form of walking, 20-30 minutes a day, if you can.   As far as diagnostic testing: labs  I would like to see you back as needed, sooner if we need to. Please call us with any interim questions, concerns, problems, updates or refill requests.   Our phone number is [phone]. We also have an after hours call service for urgent matters and there is a physician on-call for urgent questions. For any emergencies you know to call 911 or go to the nearest emergency room

## 2015-12-02 LAB — METHYLMALONIC ACID, SERUM: Methylmalonic Acid: 194 nmol/L (ref 0–378)

## 2015-12-02 LAB — CK: Total CK: 91 U/L (ref 24–173)

## 2015-12-02 LAB — ACETYLCHOLINE RECEPTOR, BINDING: AChR Binding Ab, Serum: 0.07 nmol/L (ref 0.00–0.24)

## 2015-12-02 LAB — ACETYLCHOLINE RECEPTOR, MODULATING: Acetylcholine Modulat Ab: <12 % (ref 0–20)

## 2015-12-02 LAB — HEMOGLOBIN A1C
Est. average glucose Bld gHb Est-mCnc: 120 mg/dL
Hgb A1c MFr Bld: 5.8 % — ABNORMAL HIGH (ref 4.8–5.6)

## 2015-12-02 LAB — ACETYLCHOLINE RECEPTOR, BLOCKING: Acetylchol Block Ab: 17 % (ref 0–25)

## 2015-12-02 LAB — TSH: TSH: 1.48 u[IU]/mL (ref 0.450–4.500)

## 2015-12-04 ENCOUNTER — Telehealth: Payer: Self-pay | Admitting: *Deleted

## 2015-12-04 NOTE — Telephone Encounter (Signed)
-----   Message from Melvenia Beam, MD sent at 12/03/2015  9:25 AM EDT ----- All labs normal except hgba1c which is 5.8 indicating pre-diabetes. I recommend weight loss and diet modification. thanks

## 2015-12-04 NOTE — Telephone Encounter (Signed)
Called and spoke to pt about lab results per Dr Jaynee Eagles note. Pt verbalized understanding.

## 2017-01-12 ENCOUNTER — Other Ambulatory Visit: Payer: Self-pay | Admitting: Obstetrics & Gynecology

## 2017-01-12 DIAGNOSIS — Z1239 Encounter for other screening for malignant neoplasm of breast: Secondary | ICD-10-CM

## 2017-01-14 DIAGNOSIS — Z23 Encounter for immunization: Secondary | ICD-10-CM | POA: Diagnosis not present

## 2017-01-14 DIAGNOSIS — E559 Vitamin D deficiency, unspecified: Secondary | ICD-10-CM | POA: Diagnosis not present

## 2017-01-14 DIAGNOSIS — I10 Essential (primary) hypertension: Secondary | ICD-10-CM | POA: Diagnosis not present

## 2017-01-19 DIAGNOSIS — M8580 Other specified disorders of bone density and structure, unspecified site: Secondary | ICD-10-CM

## 2017-01-19 HISTORY — DX: Other specified disorders of bone density and structure, unspecified site: M85.80

## 2017-01-28 ENCOUNTER — Ambulatory Visit
Admission: RE | Admit: 2017-01-28 | Discharge: 2017-01-28 | Disposition: A | Discharge status: Home / Self Care | Payer: Federal, State, Local not specified - PPO | Source: Ambulatory Visit | Attending: Obstetrics & Gynecology | Admitting: Obstetrics & Gynecology

## 2017-01-28 ENCOUNTER — Encounter: Payer: Self-pay | Admitting: Radiology

## 2017-01-28 DIAGNOSIS — Z1239 Encounter for other screening for malignant neoplasm of breast: Secondary | ICD-10-CM

## 2017-01-28 DIAGNOSIS — Z1231 Encounter for screening mammogram for malignant neoplasm of breast: Secondary | ICD-10-CM | POA: Diagnosis not present

## 2017-02-02 ENCOUNTER — Ambulatory Visit (INDEPENDENT_AMBULATORY_CARE_PROVIDER_SITE_OTHER): Payer: Medicare Other | Admitting: Obstetrics & Gynecology

## 2017-02-02 ENCOUNTER — Encounter: Payer: Self-pay | Admitting: Obstetrics & Gynecology

## 2017-02-02 VITALS — BP 140/70 | Ht 63.0 in | Wt 183.0 lb

## 2017-02-02 DIAGNOSIS — Z780 Asymptomatic menopausal state: Secondary | ICD-10-CM

## 2017-02-02 DIAGNOSIS — N950 Postmenopausal bleeding: Secondary | ICD-10-CM

## 2017-02-02 DIAGNOSIS — N930 Postcoital and contact bleeding: Secondary | ICD-10-CM

## 2017-02-02 DIAGNOSIS — N952 Postmenopausal atrophic vaginitis: Secondary | ICD-10-CM

## 2017-02-02 DIAGNOSIS — Z124 Encounter for screening for malignant neoplasm of cervix: Secondary | ICD-10-CM

## 2017-02-02 DIAGNOSIS — Z01411 Encounter for gynecological examination (general) (routine) with abnormal findings: Secondary | ICD-10-CM

## 2017-02-02 NOTE — Progress Notes (Signed)
Christine Steele 1950-11-14 [id number]   History:    66 y.o. G2P1A1L1  RP:  Established patient presenting for annual gyn exam   HPI:  Menopause.  No HRT.  Had mild spotting last January 2018 and mild postcoital bleeding about 2 weeks ago.  No sexual activity since then.  No pelvic pain.  Had an EBx which showed benign endometrium in 2016.  Breasts wnl.  Mictions/BMs wnl.   Past medical history,surgical history, family history and social history were all reviewed and documented in the EPIC chart.  Gynecologic History No LMP recorded. Patient is postmenopausal. Contraception: post menopausal status Last Pap: 10/2015. Results were: Neg/HPV HR neg Last mammogram: 01/28/2017. Results were: Negative Colono 2009 No recent Dexa  Obstetric History OB History  Gravida Para Term Preterm AB Living  2 1     1 1   SAB TAB Ectopic Multiple Live Births  1            # Outcome Date GA Lbr Len/2nd Weight Sex Delivery Anes PTL Lv  2 SAB           1 Para                ROS: A ROS was performed and pertinent positives and negatives are included in the history.  GENERAL: No fevers or chills. HEENT: No change in vision, no earache, sore throat or sinus congestion. NECK: No pain or stiffness. CARDIOVASCULAR: No chest pain or pressure. No palpitations. PULMONARY: No shortness of breath, cough or wheeze. GASTROINTESTINAL: No abdominal pain, nausea, vomiting or diarrhea, melena or bright red blood per rectum. GENITOURINARY: No urinary frequency, urgency, hesitancy or dysuria. MUSCULOSKELETAL: No joint or muscle pain, no back pain, no recent trauma. DERMATOLOGIC: No rash, no itching, no lesions. ENDOCRINE: No polyuria, polydipsia, no heat or cold intolerance. No recent change in weight. HEMATOLOGICAL: No anemia or easy bruising or bleeding. NEUROLOGIC: No headache, seizures, numbness, tingling or weakness. PSYCHIATRIC: No depression, no loss of interest in normal activity or change in sleep pattern.      Exam:   BP 140/70   Ht 5\' 3"  (1.6 m)   Wt 183 lb (83 kg)   BMI 32.42 kg/m   Body mass index is 32.42 kg/m.  General appearance : Well developed well nourished female. No acute distress HEENT: Eyes: no retinal hemorrhage or exudates,  Neck supple, trachea midline, no carotid bruits, no thyroidmegaly Lungs: Clear to auscultation, no rhonchi or wheezes, or rib retractions  Heart: Regular rate and rhythm, no murmurs or gallops Breast:Examined in sitting and supine position were symmetrical in appearance, no palpable masses or tenderness,  no skin retraction, no nipple inversion, no nipple discharge, no skin discoloration, no axillary or supraclavicular lymphadenopathy Abdomen: no palpable masses or tenderness, no rebound or guarding Extremities: no edema or skin discoloration or tenderness  Pelvic: Vulva normal  Bartholin, Urethra, Skene Glands: Within normal limits             Vagina: No gross lesions or discharge  Cervix: No gross lesions or discharge.  Pap reflex done.  Uterus  AV, normal size, shape and consistency, non-tender and mobile  Adnexa  Without masses or tenderness  Anus and perineum  normal    Assessment/Plan:  66 y.o. female for annual exam   1. Encounter for gynecological examination with abnormal finding Gyn exam with Atrophic Vaginitis of Menopause.  Pap reflex done.  Breasts wnl.  Normal Mammo 2018.  2. Menopause present No HRT.  Vit D supplements/Ca++ in food/Weight bearing physical activity recommended.  F/U Bone Density. - DG Bone Density; Future  3. Post-menopausal atrophic vaginitis Sexually active with postcoital bleeding recently.  Will f/u for a Pelvic US.  If negative, will start on Vagifem or Estrace cream to treat Atrophic Vaginitis which is probably causing postcoital spotting.  4. Postcoital bleeding As above, r/o Endometrial pathology.  If Pelvic US shows a thin Endometrial line, will treat Atrophic Vaginitis which is probably the cause of  her postcoital spotting and discomfort with IC. - US Transvaginal Non-OB, possible EBx; Future  5. Postmenopausal bleeding As above, r/o Endometrial pathology. - US Transvaginal Non-OB, possible EBx; Future  Counseling on above issues >50% x 10 minutes.  [name] MD, 10:47 AM 02/02/2017

## 2017-02-03 LAB — PAP IG W/ RFLX HPV ASCU

## 2017-02-03 NOTE — Patient Instructions (Signed)
1. Encounter for gynecological examination with abnormal finding Gyn exam with Atrophic Vaginitis of Menopause.  Pap reflex done.  Breasts wnl.  Normal Mammo 2018.  2. Menopause present No HRT.  Vit D supplements/Ca++ in food/Weight bearing physical activity recommended.  F/U Bone Density. - DG Bone Density; Future  3. Post-menopausal atrophic vaginitis Sexually active with postcoital bleeding recently.  Will f/u for a Pelvic US.  If negative, will start on Vagifem or Estrace cream to treat Atrophic Vaginitis which is probably causing postcoital spotting.  4. Postcoital bleeding As above, r/o Endometrial pathology.  If Pelvic US shows a thin Endometrial line, will treat Atrophic Vaginitis which is probably the cause of her postcoital spotting and discomfort with IC. - US Transvaginal Non-OB, possible EBx; Future  5. Postmenopausal bleeding As above, r/o Endometrial pathology. - US Transvaginal Non-OB, possible EBx; Future  Christine Steele, it was a pleasure seeing you today!  I will inform you of your results as soon as available and see you again for the Pelvic US.

## 2017-02-09 ENCOUNTER — Other Ambulatory Visit: Payer: Self-pay | Admitting: Obstetrics & Gynecology

## 2017-02-09 ENCOUNTER — Other Ambulatory Visit: Payer: Self-pay | Admitting: Gynecology

## 2017-02-09 DIAGNOSIS — Z780 Asymptomatic menopausal state: Secondary | ICD-10-CM

## 2017-02-11 ENCOUNTER — Telehealth: Payer: Self-pay

## 2017-02-11 ENCOUNTER — Other Ambulatory Visit: Payer: Self-pay | Admitting: Obstetrics & Gynecology

## 2017-02-11 ENCOUNTER — Ambulatory Visit (INDEPENDENT_AMBULATORY_CARE_PROVIDER_SITE_OTHER): Payer: Medicare Other

## 2017-02-11 ENCOUNTER — Ambulatory Visit (INDEPENDENT_AMBULATORY_CARE_PROVIDER_SITE_OTHER): Payer: Medicare Other | Admitting: Obstetrics & Gynecology

## 2017-02-11 DIAGNOSIS — D251 Intramural leiomyoma of uterus: Secondary | ICD-10-CM | POA: Diagnosis not present

## 2017-02-11 DIAGNOSIS — N930 Postcoital and contact bleeding: Secondary | ICD-10-CM | POA: Diagnosis not present

## 2017-02-11 DIAGNOSIS — N83202 Unspecified ovarian cyst, left side: Secondary | ICD-10-CM

## 2017-02-11 DIAGNOSIS — N950 Postmenopausal bleeding: Secondary | ICD-10-CM | POA: Diagnosis not present

## 2017-02-11 DIAGNOSIS — D252 Subserosal leiomyoma of uterus: Secondary | ICD-10-CM

## 2017-02-11 DIAGNOSIS — R9389 Abnormal findings on diagnostic imaging of other specified body structures: Secondary | ICD-10-CM

## 2017-02-11 NOTE — Progress Notes (Signed)
    Christine Steele July 26, 1950 [id number]        66 y.o.  G2P0011   RP:  PMB for Pelvic US  HPI:  No change since last visit:   Menopause.  No HRT.  Had mild spotting last January 2018 and mild postcoital bleeding about 2 weeks ago.  No sexual activity since then.  No pelvic pain.  Had an EBx which showed benign endometrium in 2016.   Past medical history,surgical history, problem list, medications, allergies, family history and social history were all reviewed and documented in the EPIC chart.  Directed ROS with pertinent positives and negatives documented in the history of present illness/assessment and plan.  Exam:  There were no vitals filed for this visit. General appearance:  Normal  Pelvic US today:  T/V and T/A Anteverted uterus 7.51 cm x 6.58 cm x 8.20 cm with intramural and subserous fibroids 3.4 x 2.2 cm, 5.0 x 3.2 x 5.6 cm, 4.1 x 2.0 cm, 2.3 x 1.7 cm, 4.0 x 3.3 cm, 1.1 x 1.2 cm.  Endometrial line at 13.3 mm with a cystic/solid focus 2.2 x 1.2 cm.  Right ovary normal.  Left ovary thin-walled echo-free cyst 2.8 x 1.8 cm with negative CFD.  Arterial blood flow present to left ovary.  No FF in CDS.  Assessment/Plan:  66 y.o. G2P0011   1. Postmenopausal bleeding Per Pelvic US today, Thickened endometrium with a 2.2 cm focal lesion, probable Endometrial Polyp.  2. Endometrial thickening on ultrasound Thickened endometrium at 13.3 mm with a cystic/solid focus 2.2 x 1.2 cm.  Decision to proceed directly with a Hysteroscopy/Myosure resection/D+C.  Procedure and Risks reviewed.  F/U Preop.  Counseling on above issues >50% x 15 minutes.  [name] MD, 9:25 AM 02/11/2017

## 2017-02-11 NOTE — Telephone Encounter (Signed)
I spoke with patient about available dates.  She wants to consider and speak with family and will call me back as soon as she can and schedule.

## 2017-02-14 NOTE — Patient Instructions (Signed)
1. Postmenopausal bleeding Per Pelvic US today, Thickened endometrium with a 2.2 cm focal lesion, probable Endometrial Polyp.  2. Endometrial thickening on ultrasound Thickened endometrium at 13.3 mm with a cystic/solid focus 2.2 x 1.2 cm.  Decision to proceed directly with a Hysteroscopy/Myosure resection/D+C.  Procedure and Risks reviewed.  F/U Preop.  Christine Steele, it was good seeing you today!  I will see you again for the Preop visit.  Hysteroscopy Hysteroscopy is a procedure used for looking inside the womb (uterus). It may be done for various reasons, including:  To evaluate abnormal bleeding, fibroid (benign, noncancerous) tumors, polyps, scar tissue (adhesions), and possibly cancer of the uterus.  To look for lumps (tumors) and other uterine growths.  To look for causes of why a woman cannot get pregnant (infertility), causes of recurrent loss of pregnancy (miscarriages), or a lost intrauterine device (IUD).  To perform a sterilization by blocking the fallopian tubes from inside the uterus.  In this procedure, a thin, flexible tube with a tiny light and camera on the end of it (hysteroscope) is used to look inside the uterus. A hysteroscopy should be done right after a menstrual period to be sure you are not pregnant. LET [facility] CARE PROVIDER KNOW ABOUT:  Any allergies you have.  All medicines you are taking, including vitamins, herbs, eye drops, creams, and over-the-counter medicines.  Previous problems you or members of your family have had with the use of anesthetics.  Any blood disorders you have.  Previous surgeries you have had.  Medical conditions you have. RISKS AND COMPLICATIONS Generally, this is a safe procedure. However, as with any procedure, complications can occur. Possible complications include:  Putting a hole in the uterus.  Excessive bleeding.  Infection.  Damage to the cervix.  Injury to other organs.  Allergic reaction to medicines.  Too  much fluid used in the uterus for the procedure.  BEFORE THE PROCEDURE  Ask your health care provider about changing or stopping any regular medicines.  Do not take aspirin or blood thinners for 1 week before the procedure, or as directed by your health care provider. These can cause bleeding.  If you smoke, do not smoke for 2 weeks before the procedure.  In some cases, a medicine is placed in the cervix the day before the procedure. This medicine makes the cervix have a larger opening (dilate). This makes it easier for the instrument to be inserted into the uterus during the procedure.  Do not eat or drink anything for at least 8 hours before the surgery.  Arrange for someone to take you home after the procedure. PROCEDURE  You may be given a medicine to relax you (sedative). You may also be given one of the following: ? A medicine that numbs the area around the cervix (local anesthetic). ? A medicine that makes you sleep through the procedure (general anesthetic).  The hysteroscope is inserted through the vagina into the uterus. The camera on the hysteroscope sends a picture to a TV screen. This gives the surgeon a good view inside the uterus.  During the procedure, air or a liquid is put into the uterus, which allows the surgeon to see better.  Sometimes, tissue is gently scraped from inside the uterus. These tissue samples are sent to a lab for testing. What to expect after the procedure  If you had a general anesthetic, you may be groggy for a couple hours after the procedure.  If you had a local anesthetic, you will  be able to go home as soon as you are stable and feel ready.  You may have some cramping. This normally lasts for a couple days.  You may have bleeding, which varies from light spotting for a few days to menstrual-like bleeding for 3-7 days. This is normal.  If your test results are not back during the visit, make an appointment with your health care provider to  find out the results. This information is not intended to replace advice given to you by your health care provider. Make sure you discuss any questions you have with your health care provider. Document Released: 07/14/2000 Document Revised: 09/13/2015 Document Reviewed: 11/04/2012 Elsevier Interactive Patient Education  2017 [employer].

## 2017-02-17 ENCOUNTER — Ambulatory Visit (INDEPENDENT_AMBULATORY_CARE_PROVIDER_SITE_OTHER): Payer: Medicare Other

## 2017-02-17 DIAGNOSIS — M8588 Other specified disorders of bone density and structure, other site: Secondary | ICD-10-CM | POA: Diagnosis not present

## 2017-02-17 DIAGNOSIS — Z780 Asymptomatic menopausal state: Secondary | ICD-10-CM

## 2017-02-17 DIAGNOSIS — M85852 Other specified disorders of bone density and structure, left thigh: Secondary | ICD-10-CM

## 2017-02-18 ENCOUNTER — Encounter: Payer: Self-pay | Admitting: Gynecology

## 2017-02-18 ENCOUNTER — Other Ambulatory Visit: Payer: Self-pay | Admitting: Gynecology

## 2017-02-18 DIAGNOSIS — M85852 Other specified disorders of bone density and structure, left thigh: Secondary | ICD-10-CM

## 2017-02-18 DIAGNOSIS — Z780 Asymptomatic menopausal state: Secondary | ICD-10-CM

## 2017-03-03 ENCOUNTER — Encounter (HOSPITAL_BASED_OUTPATIENT_CLINIC_OR_DEPARTMENT_OTHER): Payer: Self-pay | Admitting: *Deleted

## 2017-03-03 ENCOUNTER — Other Ambulatory Visit: Payer: Self-pay

## 2017-03-03 NOTE — Progress Notes (Signed)
NPO after Midnight, arrival time 5:30 AM, Needs EKG, getting lab work done 03/04/17

## 2017-03-04 ENCOUNTER — Encounter (HOSPITAL_COMMUNITY)
Admission: RE | Admit: 2017-03-04 | Discharge: 2017-03-04 | Disposition: A | Discharge status: Home / Self Care | Payer: Medicare Other | Source: Ambulatory Visit | Attending: Obstetrics & Gynecology | Admitting: Obstetrics & Gynecology

## 2017-03-04 DIAGNOSIS — Z79899 Other long term (current) drug therapy: Secondary | ICD-10-CM | POA: Diagnosis not present

## 2017-03-04 DIAGNOSIS — N840 Polyp of corpus uteri: Secondary | ICD-10-CM | POA: Diagnosis not present

## 2017-03-04 DIAGNOSIS — N930 Postcoital and contact bleeding: Secondary | ICD-10-CM | POA: Diagnosis not present

## 2017-03-04 DIAGNOSIS — F419 Anxiety disorder, unspecified: Secondary | ICD-10-CM | POA: Diagnosis not present

## 2017-03-04 DIAGNOSIS — Z823 Family history of stroke: Secondary | ICD-10-CM | POA: Diagnosis not present

## 2017-03-04 DIAGNOSIS — N189 Chronic kidney disease, unspecified: Secondary | ICD-10-CM | POA: Diagnosis not present

## 2017-03-04 DIAGNOSIS — Z7982 Long term (current) use of aspirin: Secondary | ICD-10-CM | POA: Diagnosis not present

## 2017-03-04 DIAGNOSIS — G4733 Obstructive sleep apnea (adult) (pediatric): Secondary | ICD-10-CM | POA: Diagnosis not present

## 2017-03-04 DIAGNOSIS — I129 Hypertensive chronic kidney disease with stage 1 through stage 4 chronic kidney disease, or unspecified chronic kidney disease: Secondary | ICD-10-CM | POA: Diagnosis not present

## 2017-03-04 DIAGNOSIS — M8580 Other specified disorders of bone density and structure, unspecified site: Secondary | ICD-10-CM | POA: Diagnosis not present

## 2017-03-04 DIAGNOSIS — Z87891 Personal history of nicotine dependence: Secondary | ICD-10-CM | POA: Diagnosis not present

## 2017-03-04 DIAGNOSIS — Z91010 Allergy to peanuts: Secondary | ICD-10-CM | POA: Diagnosis not present

## 2017-03-04 DIAGNOSIS — Z8249 Family history of ischemic heart disease and other diseases of the circulatory system: Secondary | ICD-10-CM | POA: Diagnosis not present

## 2017-03-04 DIAGNOSIS — N950 Postmenopausal bleeding: Secondary | ICD-10-CM | POA: Diagnosis not present

## 2017-03-04 DIAGNOSIS — E669 Obesity, unspecified: Secondary | ICD-10-CM | POA: Diagnosis not present

## 2017-03-04 DIAGNOSIS — R000 Tachycardia, unspecified: Secondary | ICD-10-CM | POA: Diagnosis not present

## 2017-03-04 DIAGNOSIS — Z91018 Allergy to other foods: Secondary | ICD-10-CM | POA: Diagnosis not present

## 2017-03-04 DIAGNOSIS — E559 Vitamin D deficiency, unspecified: Secondary | ICD-10-CM | POA: Diagnosis not present

## 2017-03-04 DIAGNOSIS — Z6832 Body mass index (BMI) 32.0-32.9, adult: Secondary | ICD-10-CM | POA: Diagnosis not present

## 2017-03-04 DIAGNOSIS — R9389 Abnormal findings on diagnostic imaging of other specified body structures: Secondary | ICD-10-CM | POA: Diagnosis not present

## 2017-03-04 DIAGNOSIS — Z888 Allergy status to other drugs, medicaments and biological substances status: Secondary | ICD-10-CM | POA: Diagnosis not present

## 2017-03-04 DIAGNOSIS — I729 Aneurysm of unspecified site: Secondary | ICD-10-CM | POA: Diagnosis not present

## 2017-03-04 LAB — BASIC METABOLIC PANEL
Anion gap: 8 (ref 5–15)
BUN: 20 mg/dL (ref 6–20)
CO2: 24 mmol/L (ref 22–32)
Calcium: 8.8 mg/dL — ABNORMAL LOW (ref 8.9–10.3)
Chloride: 103 mmol/L (ref 101–111)
Creatinine, Ser: 1.32 mg/dL — ABNORMAL HIGH (ref 0.44–1.00)
GFR calc Af Amer: 48 mL/min — ABNORMAL LOW (ref >60)
GFR calc non Af Amer: 41 mL/min — ABNORMAL LOW (ref >60)
Glucose, Bld: 90 mg/dL (ref 65–99)
Potassium: 3.7 mmol/L (ref 3.5–5.1)
Sodium: 135 mmol/L (ref 135–145)

## 2017-03-04 LAB — CBC
HCT: 37.9 % (ref 36.0–46.0)
Hemoglobin: 12.7 g/dL (ref 12.0–15.0)
MCH: 30.2 pg (ref 26.0–34.0)
MCHC: 33.5 g/dL (ref 30.0–36.0)
MCV: 90.2 fL (ref 78.0–100.0)
Platelets: 249 10*3/uL (ref 150–400)
RBC: 4.2 MIL/uL (ref 3.87–5.11)
RDW: 13.5 % (ref 11.5–15.5)
WBC: 4.7 10*3/uL (ref 4.0–10.5)

## 2017-03-06 ENCOUNTER — Other Ambulatory Visit: Payer: Self-pay

## 2017-03-06 ENCOUNTER — Ambulatory Visit (HOSPITAL_BASED_OUTPATIENT_CLINIC_OR_DEPARTMENT_OTHER)
Admission: RE | Admit: 2017-03-06 | Discharge: 2017-03-06 | Disposition: A | Discharge status: Home / Self Care | Payer: Medicare Other | Source: Ambulatory Visit | Attending: Obstetrics & Gynecology | Admitting: Obstetrics & Gynecology

## 2017-03-06 ENCOUNTER — Encounter (HOSPITAL_BASED_OUTPATIENT_CLINIC_OR_DEPARTMENT_OTHER): Payer: Self-pay | Admitting: *Deleted

## 2017-03-06 ENCOUNTER — Encounter (HOSPITAL_BASED_OUTPATIENT_CLINIC_OR_DEPARTMENT_OTHER)
Admission: RE | Disposition: A | Discharge status: Home / Self Care | Payer: Self-pay | Source: Ambulatory Visit | Attending: Obstetrics & Gynecology

## 2017-03-06 ENCOUNTER — Ambulatory Visit (HOSPITAL_BASED_OUTPATIENT_CLINIC_OR_DEPARTMENT_OTHER): Payer: Medicare Other | Admitting: Anesthesiology

## 2017-03-06 DIAGNOSIS — Z7982 Long term (current) use of aspirin: Secondary | ICD-10-CM | POA: Diagnosis not present

## 2017-03-06 DIAGNOSIS — I129 Hypertensive chronic kidney disease with stage 1 through stage 4 chronic kidney disease, or unspecified chronic kidney disease: Secondary | ICD-10-CM | POA: Diagnosis not present

## 2017-03-06 DIAGNOSIS — N930 Postcoital and contact bleeding: Secondary | ICD-10-CM | POA: Insufficient documentation

## 2017-03-06 DIAGNOSIS — R9389 Abnormal findings on diagnostic imaging of other specified body structures: Secondary | ICD-10-CM | POA: Diagnosis not present

## 2017-03-06 DIAGNOSIS — I10 Essential (primary) hypertension: Secondary | ICD-10-CM | POA: Diagnosis not present

## 2017-03-06 DIAGNOSIS — N950 Postmenopausal bleeding: Secondary | ICD-10-CM | POA: Insufficient documentation

## 2017-03-06 DIAGNOSIS — N189 Chronic kidney disease, unspecified: Secondary | ICD-10-CM | POA: Diagnosis not present

## 2017-03-06 DIAGNOSIS — Z888 Allergy status to other drugs, medicaments and biological substances status: Secondary | ICD-10-CM | POA: Insufficient documentation

## 2017-03-06 DIAGNOSIS — M8580 Other specified disorders of bone density and structure, unspecified site: Secondary | ICD-10-CM | POA: Insufficient documentation

## 2017-03-06 DIAGNOSIS — Z87891 Personal history of nicotine dependence: Secondary | ICD-10-CM | POA: Insufficient documentation

## 2017-03-06 DIAGNOSIS — Z6832 Body mass index (BMI) 32.0-32.9, adult: Secondary | ICD-10-CM | POA: Insufficient documentation

## 2017-03-06 DIAGNOSIS — F419 Anxiety disorder, unspecified: Secondary | ICD-10-CM | POA: Insufficient documentation

## 2017-03-06 DIAGNOSIS — G4733 Obstructive sleep apnea (adult) (pediatric): Secondary | ICD-10-CM | POA: Insufficient documentation

## 2017-03-06 DIAGNOSIS — Z79899 Other long term (current) drug therapy: Secondary | ICD-10-CM | POA: Diagnosis not present

## 2017-03-06 DIAGNOSIS — Z8249 Family history of ischemic heart disease and other diseases of the circulatory system: Secondary | ICD-10-CM | POA: Insufficient documentation

## 2017-03-06 DIAGNOSIS — R000 Tachycardia, unspecified: Secondary | ICD-10-CM | POA: Insufficient documentation

## 2017-03-06 DIAGNOSIS — Z91010 Allergy to peanuts: Secondary | ICD-10-CM | POA: Insufficient documentation

## 2017-03-06 DIAGNOSIS — Z91018 Allergy to other foods: Secondary | ICD-10-CM | POA: Insufficient documentation

## 2017-03-06 DIAGNOSIS — E669 Obesity, unspecified: Secondary | ICD-10-CM | POA: Insufficient documentation

## 2017-03-06 DIAGNOSIS — I729 Aneurysm of unspecified site: Secondary | ICD-10-CM | POA: Insufficient documentation

## 2017-03-06 DIAGNOSIS — N840 Polyp of corpus uteri: Secondary | ICD-10-CM | POA: Insufficient documentation

## 2017-03-06 DIAGNOSIS — Z823 Family history of stroke: Secondary | ICD-10-CM | POA: Insufficient documentation

## 2017-03-06 DIAGNOSIS — E559 Vitamin D deficiency, unspecified: Secondary | ICD-10-CM | POA: Diagnosis not present

## 2017-03-06 DIAGNOSIS — D509 Iron deficiency anemia, unspecified: Secondary | ICD-10-CM | POA: Diagnosis not present

## 2017-03-06 HISTORY — DX: Aneurysm of unspecified site: I72.9

## 2017-03-06 HISTORY — DX: Unspecified cataract: H26.9

## 2017-03-06 HISTORY — DX: Obstructive sleep apnea (adult) (pediatric): G47.33

## 2017-03-06 HISTORY — DX: Dependence on other enabling machines and devices: Z99.89

## 2017-03-06 HISTORY — DX: Chronic kidney disease, unspecified: N18.9

## 2017-03-06 HISTORY — PX: DILATATION & CURETTAGE/HYSTEROSCOPY WITH MYOSURE: SHX6511

## 2017-03-06 SURGERY — DILATATION & CURETTAGE/HYSTEROSCOPY WITH MYOSURE
Anesthesia: General

## 2017-03-06 MED ORDER — ONDANSETRON HCL 4 MG/2ML IJ SOLN
INTRAMUSCULAR | Status: DC | PRN
  Administered 2017-03-06: 4 mg via INTRAVENOUS

## 2017-03-06 MED ORDER — EPHEDRINE SULFATE-NACL 50-0.9 MG/10ML-% IV SOSY
PREFILLED_SYRINGE | INTRAVENOUS | Status: DC | PRN
  Administered 2017-03-06: 10 mg via INTRAVENOUS

## 2017-03-06 MED ORDER — CEFAZOLIN SODIUM-DEXTROSE 2-4 GM/100ML-% IV SOLN
INTRAVENOUS | Status: AC
  Filled 2017-03-06: qty 100

## 2017-03-06 MED ORDER — PROPOFOL 10 MG/ML IV BOLUS
INTRAVENOUS | Status: AC
  Filled 2017-03-06: qty 40

## 2017-03-06 MED ORDER — EPHEDRINE 5 MG/ML INJ
INTRAVENOUS | Status: AC
  Filled 2017-03-06: qty 10

## 2017-03-06 MED ORDER — ONDANSETRON HCL 4 MG/2ML IJ SOLN
4.0000 mg | Freq: Once | INTRAMUSCULAR | Status: DC | PRN
  Filled 2017-03-06: qty 2

## 2017-03-06 MED ORDER — OXYCODONE-ACETAMINOPHEN 7.5-325 MG PO TABS: 1.0000 | ORAL_TABLET | Freq: Three times a day (TID) | ORAL | 0 refills | Status: DC | PRN

## 2017-03-06 MED ORDER — PROPOFOL 10 MG/ML IV BOLUS
INTRAVENOUS | Status: DC | PRN
  Administered 2017-03-06: 160 mg via INTRAVENOUS

## 2017-03-06 MED ORDER — LACTATED RINGERS IV SOLN
INTRAVENOUS | Status: DC
  Administered 2017-03-06 (×2): via INTRAVENOUS
  Filled 2017-03-06: qty 1000

## 2017-03-06 MED ORDER — FENTANYL CITRATE (PF) 100 MCG/2ML IJ SOLN
25.0000 ug | INTRAMUSCULAR | Status: DC | PRN
  Administered 2017-03-06 (×2): 50 ug via INTRAVENOUS
  Filled 2017-03-06: qty 1

## 2017-03-06 MED ORDER — FENTANYL CITRATE (PF) 100 MCG/2ML IJ SOLN
INTRAMUSCULAR | Status: AC
  Filled 2017-03-06: qty 2

## 2017-03-06 MED ORDER — ONDANSETRON HCL 4 MG/2ML IJ SOLN
INTRAMUSCULAR | Status: AC
  Filled 2017-03-06: qty 2

## 2017-03-06 MED ORDER — DEXAMETHASONE SODIUM PHOSPHATE 10 MG/ML IJ SOLN
INTRAMUSCULAR | Status: DC | PRN
  Administered 2017-03-06: 10 mg via INTRAVENOUS

## 2017-03-06 MED ORDER — LACTATED RINGERS IV SOLN
INTRAVENOUS | Status: DC
  Filled 2017-03-06: qty 1000

## 2017-03-06 MED ORDER — CEFAZOLIN SODIUM-DEXTROSE 2-4 GM/100ML-% IV SOLN
2.0000 g | INTRAVENOUS | Status: AC
  Administered 2017-03-06: 2 g via INTRAVENOUS
  Filled 2017-03-06: qty 100

## 2017-03-06 MED ORDER — DEXAMETHASONE SODIUM PHOSPHATE 10 MG/ML IJ SOLN
INTRAMUSCULAR | Status: AC
  Filled 2017-03-06: qty 1

## 2017-03-06 MED ORDER — KETOROLAC TROMETHAMINE 30 MG/ML IJ SOLN
INTRAMUSCULAR | Status: AC
  Filled 2017-03-06: qty 1

## 2017-03-06 MED ORDER — CHLOROPROCAINE HCL 1 % IJ SOLN
INTRAMUSCULAR | Status: DC | PRN
  Administered 2017-03-06: 20 mL

## 2017-03-06 MED ORDER — LIDOCAINE 2% (20 MG/ML) 5 ML SYRINGE
INTRAMUSCULAR | Status: DC | PRN
  Administered 2017-03-06: 50 mg via INTRAVENOUS

## 2017-03-06 MED ORDER — MIDAZOLAM HCL 2 MG/2ML IJ SOLN
INTRAMUSCULAR | Status: AC
  Filled 2017-03-06: qty 2

## 2017-03-06 MED ORDER — LIDOCAINE 2% (20 MG/ML) 5 ML SYRINGE
INTRAMUSCULAR | Status: AC
  Filled 2017-03-06: qty 5

## 2017-03-06 MED ORDER — MIDAZOLAM HCL 2 MG/2ML IJ SOLN
INTRAMUSCULAR | Status: DC | PRN
  Administered 2017-03-06: 1 mg via INTRAVENOUS

## 2017-03-06 SURGICAL SUPPLY — 26 items
CANISTER SUCT 3000ML PPV (MISCELLANEOUS) ×3 IMPLANT
CATH ROBINSON RED A/P 14FR (CATHETERS) ×2 IMPLANT
CATH ROBINSON RED A/P 16FR (CATHETERS) ×3 IMPLANT
CLOTH BEACON ORANGE TIMEOUT ST (SAFETY) ×3 IMPLANT
COUNTER NEEDLE 1200 MAGNETIC (NEEDLE) ×1 IMPLANT
DEVICE MYOSURE LITE (MISCELLANEOUS) IMPLANT
DEVICE MYOSURE REACH (MISCELLANEOUS) ×3 IMPLANT
DILATOR CANAL MILEX (MISCELLANEOUS) IMPLANT
ELECT REM PT RETURN 9FT ADLT (ELECTROSURGICAL)
ELECTRODE REM PT RTRN 9FT ADLT (ELECTROSURGICAL) IMPLANT
FILTER ARTHROSCOPY CONVERTOR (FILTER) ×3 IMPLANT
GLOVE BIO SURGEON STRL SZ 6.5 (GLOVE) ×2 IMPLANT
GLOVE BIO SURGEONS STRL SZ 6.5 (GLOVE) ×1
GLOVE BIOGEL PI IND STRL 7.0 (GLOVE) ×2 IMPLANT
GLOVE BIOGEL PI INDICATOR 7.0 (GLOVE) ×4
GOWN STRL REUS W/TWL LRG LVL3 (GOWN DISPOSABLE) ×6 IMPLANT
IV NS IRRIG 3000ML ARTHROMATIC (IV SOLUTION) ×6 IMPLANT
MYOSURE XL FIBROID REM (MISCELLANEOUS)
PACK VAGINAL MINOR WOMEN LF (CUSTOM PROCEDURE TRAY) ×3 IMPLANT
PAD OB MATERNITY 4.3X12.25 (PERSONAL CARE ITEMS) ×3 IMPLANT
PAD PREP 24X48 CUFFED NSTRL (MISCELLANEOUS) ×3 IMPLANT
SEAL ROD LENS SCOPE MYOSURE (ABLATOR) ×3 IMPLANT
SYSTEM TISS REMOVAL MYSR XL RM (MISCELLANEOUS) IMPLANT
TOWEL OR 17X24 6PK STRL BLUE (TOWEL DISPOSABLE) ×6 IMPLANT
TUBING AQUILEX INFLOW (TUBING) ×3 IMPLANT
TUBING AQUILEX OUTFLOW (TUBING) ×3 IMPLANT

## 2017-03-06 NOTE — Op Note (Signed)
Operative Note  03/06/2017  8:19 AM  PATIENT:  Christine Steele  66 y.o. female  PRE-OPERATIVE DIAGNOSIS:  Post menopausal bleeding, thickened endometrium, intrauterine lesions  POST-OPERATIVE DIAGNOSIS:  Post menopausal bleeding, thickened endometrium, intrauterine lesions  PROCEDURE:  Procedure(s): DILATATION & CURETTAGE/HYSTEROSCOPY WITH MYOSURE RESECTION OF 2 POLYPS  SURGEON:  Surgeon(s): [name], MD  ANESTHESIA:   general  FINDINGS:  2 intrauterine polyps:  2+ cm and 0.5 cm.  Atrophic Endometrium otherwise.  DESCRIPTION OF OPERATION: Under general anesthesia with laryngeal mask, the patient is in lithotomy position.  She is prepped with Betadine on the suprapubic, vulvar and vaginal areas.  Her bladder was catheterized.  She is draped as usual.  Timeout is done.  The vaginal exam reveals an anteverted uterus normal volume no adnexal mass.  Insertion of the speculum in the vagina.  The anterior lip of the cervix is grasped with a tenaculum.  A paracervical block is done with Nesacaine 1% a total of 20 cc at 4 and 8:00.  Dilation of the cervix with dilators up to #23 without difficulty.  Insertion of the hysteroscope and the intrauterine cavity.  Visualization of the intrauterine cavity reveals 2 endometrial polyps one measuring a little more than 2 cm attached to the fundus and anterior wall of the cavity, the other one about 5 mm attached to the fundus close to the right ostium.  Both ostia are well seen.  Pictures are taken.  Insertion of the Myosure instrument.  Easy complete resection of both polyps.  Good hemostasis.  The endometrial cavity appears atrophic otherwise.  Resection of the endometrium none-the-less with the Myosure instrument.  Pictures taken post resection.  Hysteroscope and Myosure instrument removed.  Curettage of the endometrial cavity on all surfaces with a sharp curette.  All specimens sent to pathology together.  Tenaculum removed from the cervix.  Good  hemostasis at that level as well.  Speculum removed.  The patient was brought to recovery room in good and stable status.  ESTIMATED BLOOD LOSS: 5 mL   FLUID DEFICIT:  105 cc   Intake/Output Summary (Last 24 hours) at 03/06/2017 0819 Last data filed at 03/06/2017 0804 Gross per 24 hour  Intake 500 ml  Output 5 ml  Net 495 ml     BLOOD ADMINISTERED:none   LOCAL MEDICATIONS USED:  Paracervical block with Nesacaine 1%, Amount: 20 ml  SPECIMEN:  Source of Specimen:  Endometrial curettings and Polyps (2) resection material  DISPOSITION OF SPECIMEN:  PATHOLOGY  COUNTS:  YES  PLAN OF CARE: Transfer to PACU  Marie-Lyne LavoieMD8:19 AM

## 2017-03-06 NOTE — Discharge Summary (Signed)
Physician Discharge Summary  Patient ID: Christine Steele MRN: [id number] DOB/AGE: 65-10-1950 65 y.o.  Admit date: 03/06/2017 Discharge date: 03/06/2017  Admission Diagnoses: post menopausal bleeding, thickened endometrium, Intrauterine lesions   Discharge Diagnoses:  Post menopausal bleeding, 2 Endometrial Polyps  Active Problems:   * No active hospital problems. *   Discharged Condition: good  Consults:None  Significant Diagnostic Studies: None  Treatments:surgery: Hysteroscopy with Myosure resection of 2 Endometrial Polyps and D+C  Vitals:   03/06/17 0542 03/06/17 0815  BP: 137/69 105/69  Pulse: 67 61  Resp: 18 10  Temp: 98.6 F (37 C) (!) 97.4 F (36.3 C)  SpO2: 100% 100%     Total I/O In: 800 [I.V.:700; IV Piggyback:100] Out: 5 [Blood:5]   Hospital Course: Good  Discharge Exam: Normal  Disposition: Home    Allergies as of 03/06/2017      Reactions   Amlodipine    Leg/feet swelling.   Chocolate Other (See Comments)   Headache   Coconut Fatty Acids Other (See Comments)   Other    MSG causes headaches   Peanut-containing Drug Products    All nuts headache      Medication List    TAKE these medications   aspirin 325 MG tablet Take 325 mg every other day by mouth.   bisoprolol 5 MG tablet Commonly known as:  ZEBETA Take 2.5 mg by mouth at bedtime.   irbesartan-hydrochlorothiazide 300-12.5 MG tablet Commonly known as:  AVALIDE Take 1 tablet daily by mouth.   oxyCODONE-acetaminophen 7.5-325 MG tablet Commonly known as:  PERCOCET Take 1 tablet every 8 (eight) hours as needed by mouth for severe pain.   VITAMIN D PO Take 2,000 Units by mouth daily.         SignedPrincess Bruins 03/06/2017, 8:33 AM

## 2017-03-06 NOTE — Anesthesia Preprocedure Evaluation (Signed)
Anesthesia Evaluation  Patient identified by MRN, date of birth, ID band Patient awake    Reviewed: Allergy & Precautions, NPO status , Patient's Chart, lab work & pertinent test results, reviewed documented beta blocker date and time   Airway Mallampati: II  TM Distance: >3 FB Neck ROM: Full    Dental  (+) Teeth Intact, Dental Advisory Given   Pulmonary sleep apnea and Continuous Positive Airway Pressure Ventilation , former smoker,    Pulmonary exam normal breath sounds clear to auscultation       Cardiovascular hypertension, Pt. on home beta blockers and Pt. on medications Normal cardiovascular exam Rhythm:Regular Rate:Normal     Neuro/Psych PSYCHIATRIC DISORDERS Anxiety 1mm R MCA aneurysm  negative neurological ROS     GI/Hepatic Neg liver ROS, GERD  ,  Endo/Other  Obesity   Renal/GU Renal InsufficiencyRenal disease     Musculoskeletal negative musculoskeletal ROS (+)   Abdominal   Peds  Hematology negative hematology ROS (+)   Anesthesia Other Findings Day of surgery medications reviewed with the patient.  Reproductive/Obstetrics PMB                             Anesthesia Physical Anesthesia Plan  ASA: III  Anesthesia Plan: General   Post-op Pain Management:    Induction: Intravenous  PONV Risk Score and Plan: 4 or greater and Dexamethasone, Ondansetron and Midazolam  Airway Management Planned: LMA  Additional Equipment:   Intra-op Plan:   Post-operative Plan: Extubation in OR  Informed Consent: I have reviewed the patients History and Physical, chart, labs and discussed the procedure including the risks, benefits and alternatives for the proposed anesthesia with the patient or authorized representative who has indicated his/her understanding and acceptance.   Dental advisory given  Plan Discussed with: CRNA  Anesthesia Plan Comments: (Risks/benefits of general  anesthesia discussed with patient including risk of damage to teeth, lips, gum, and tongue, nausea/vomiting, allergic reactions to medications, and the possibility of heart attack, stroke and death.  All patient questions answered.  Patient wishes to proceed.)        Anesthesia Quick Evaluation

## 2017-03-06 NOTE — Anesthesia Postprocedure Evaluation (Signed)
Anesthesia Post Note  Patient: Christine Steele  Procedure(s) Performed: DILATATION & CURETTAGE/HYSTEROSCOPY WITH MYOSURE (N/A )     Patient location during evaluation: PACU Anesthesia Type: General Level of consciousness: awake and alert Pain management: pain level controlled Vital Signs Assessment: post-procedure vital signs reviewed and stable Respiratory status: spontaneous breathing, nonlabored ventilation and respiratory function stable Cardiovascular status: blood pressure returned to baseline and stable Postop Assessment: no apparent nausea or vomiting Anesthetic complications: no    Last Vitals:  Vitals:   03/06/17 0845 03/06/17 0900  BP: 110/64 111/69  Pulse: 60 (!) 55  Resp: 12 14  Temp:    SpO2: 96% 98%    Last Pain:  Vitals:   03/06/17 0915  TempSrc:   PainSc: 4                  Catalina Gravel

## 2017-03-06 NOTE — H&P (Signed)
@LOGO @  Christine Steele is an 66 y.o. female. Christine Steele   RP:  PMB with IU lesion 2.2 cm for HSC Resection, D+C  HPI:  No change since last visit:  Menopause. No HRT. Had mild spotting last January 2018 and mild postcoital bleeding about 2 weeks ago. No sexual activity since then. No pelvic pain. Had an EBx which showed benign endometrium in 2016.   Pertinent Gynecological History: PMB: Mild vaginal bleeding x January 2018/Postcoital bleeding Blood transfusions: none Sexually transmitted diseases: no past history Previous GYN Procedures: None  Last mammogram: normal  Last pap: normal  OB History: G2P0011  Menstrual History:  No LMP recorded. Patient is postmenopausal.    Past Medical History:  Diagnosis Date  . Aneurysm (HCC)    53mm Right MCA  . Cataract    Bilateral  . Chronic kidney disease    Dr. Shelia Media (PCP)   . Hypertension   . OSA on CPAP    havent used CPAP in long time per patient due to unable to get filters to change out  . Osteopenia 01/2017   T score -1.4 FRAX 3.4% / 0.4%  . Potassium (K) deficiency    no longer taking medication for potassium MD no longer prescribed  . Tachycardia   . Vitamin D deficiency     Past Surgical History:  Procedure Laterality Date  . BIOPSY BREAST  1970   Right Breast  . COLONOSCOPY      Family History  Problem Relation Age of Onset  . Hypertension Mother   . Heart attack Father   . Hypertension Father   . Stroke Father     Social History:  reports that she quit smoking about 37 years ago. Her smoking use included cigarettes. She has a 5.00 pack-year smoking history. she has never used smokeless tobacco. She reports that she does not drink alcohol or use drugs.  Allergies:  Allergies  Allergen Reactions  . Amlodipine     Leg/feet swelling.  . Chocolate Other (See Comments)    Headache  . Coconut Fatty Acids Other (See Comments)  . Other     MSG causes headaches  . Peanut-Containing Drug Products     All  nuts headache    Medications Prior to Admission  Medication Sig Dispense Refill Last Dose  . aspirin 325 MG tablet Take 325 mg every other day by mouth.    02/27/2017 at Unknown time  . bisoprolol (ZEBETA) 5 MG tablet Take 2.5 mg by mouth at bedtime.    03/05/2017 at Unknown time  . Cholecalciferol (VITAMIN D PO) Take 2,000 Units by mouth daily.   Past Month at Unknown time  . irbesartan-hydrochlorothiazide (AVALIDE) 300-12.5 MG tablet Take 1 tablet daily by mouth.    03/05/2017 at Unknown time    REVIEW OF SYSTEMS: A ROS was performed and pertinent positives and negatives are included in the history.  GENERAL: No fevers or chills. HEENT: No change in vision, no earache, sore throat or sinus congestion. NECK: No pain or stiffness. CARDIOVASCULAR: No chest pain or pressure. No palpitations. PULMONARY: No shortness of breath, cough or wheeze. GASTROINTESTINAL: No abdominal pain, nausea, vomiting or diarrhea, melena or bright red blood per rectum. GENITOURINARY: No urinary frequency, urgency, hesitancy or dysuria. MUSCULOSKELETAL: No joint or muscle pain, no back pain, no recent trauma. DERMATOLOGIC: No rash, no itching, no lesions. ENDOCRINE: No polyuria, polydipsia, no heat or cold intolerance. No recent change in weight. HEMATOLOGICAL: No anemia or easy bruising or bleeding.  NEUROLOGIC: No headache, seizures, numbness, tingling or weakness. PSYCHIATRIC: No depression, no loss of interest in normal activity or change in sleep pattern.     Blood pressure 137/69, pulse 67, temperature 98.6 F (37 C), temperature source Oral, resp. rate 18, height 5\' 3"  (1.6 m), weight 184 lb (83.5 kg), SpO2 100 %.  Physical Exam:  See office notes  Pelvic US 02/11/2017:  T/V and T/A Anteverted uterus 7.51 cm x 6.58 cm x 8.20 cm with intramural and subserous fibroids 3.4 x 2.2 cm, 5.0 x 3.2 x 5.6 cm, 4.1 x 2.0 cm, 2.3 x 1.7 cm, 4.0 x 3.3 cm, 1.1 x 1.2 cm.  Endometrial line at 13.3 mm with a cystic/solid focus 2.2 x  1.2 cm.  Right ovary normal.  Left ovary thin-walled echo-free cyst 2.8 x 1.8 cm with negative CFD.  Arterial blood flow present to left ovary.  No FF in CDS.  Assessment/Plan:  66 y.o. G2P0011   1. Postmenopausal bleeding Per Pelvic US today, Thickened endometrium with a 2.2 cm focal lesion, probable Endometrial Polyp.  2. Endometrial thickening on ultrasound Thickened endometrium at 13.3 mm with a cystic/solid focus 2.2 x 1.2 cm.  Decision to proceed directly with a Hysteroscopy/Myosure resection/D+C.  Procedure and Risks reviewed.                          Patient was counseled as to the risk of surgery to include the following:  1. Infection (prohylactic antibiotics will be administered)  2. DVT/Pulmonary Embolism (prophylactic pneumo compression stockings will be used)  3.Trauma to internal organs requiring additional surgical procedure to repair any injury to     Internal organs requiring perhaps additional hospitalization days.  4.Hemmorhage requiring transfusion and blood products which carry risks such as anaphylactic reaction, hepatitis and AIDS  Patient had received literature information on the procedure scheduled and all her questions were answered and fully accepts all risk.   Christine LavoieMD6:15 AM    Christine Steele 03/06/2017, 6:13 AM

## 2017-03-06 NOTE — Anesthesia Procedure Notes (Signed)
Procedure Name: LMA Insertion Date/Time: 03/06/2017 7:40 AM Performed by: Suan Halter, CRNA Pre-anesthesia Checklist: Patient identified, Emergency Drugs available, Suction available and Patient being monitored Patient Re-evaluated:Patient Re-evaluated prior to induction Oxygen Delivery Method: Circle system utilized Preoxygenation: Pre-oxygenation with 100% oxygen Induction Type: IV induction Ventilation: Mask ventilation without difficulty LMA: LMA inserted LMA Size: 4.0 Number of attempts: 1 Airway Equipment and Method: Bite block Placement Confirmation: positive ETCO2 Tube secured with: Tape Dental Injury: Teeth and Oropharynx as per pre-operative assessment

## 2017-03-06 NOTE — Discharge Instructions (Signed)
° °  D & C Home care Instructions: ° ° °Personal hygiene:  Used sanitary napkins for vaginal drainage not tampons. Shower or tub bathe the day after your procedure. No douching until bleeding stops. Always wipe from front to back after  Elimination. ° °Activity: Do not drive or operate any equipment today. The effects of the anesthesia are still present and drowsiness may result. Rest today, not necessarily flat bed rest, just take it easy. You may resume your normal activity in one to 2 days. ° °Sexual activity: No intercourse for one week or as indicated by your physician ° °Diet: Eat a light diet as desired this evening. You may resume a regular diet tomorrow. ° °Return to work: One to 2 days. ° °General Expectations of your surgery: Vaginal bleeding should be no heavier than a normal period. Spotting may continue up to 10 days. Mild cramps may continue for a couple of days. You may have a regular period in 2-6 weeks. ° °Unexpected observations call your doctor if these occur: persistent or heavy bleeding. Severe abdominal cramping or pain. Elevation of temperature greater than 100°F. ° °Call for an appointment in one week. ° ° ° °Patient's Signature_______________________________________________________ ° °Nurse's Signature________________________________________________________ ° ° °Post Anesthesia Home Care Instructions ° °Activity: °Get plenty of rest for the remainder of the day. A responsible individual must stay with you for 24 hours following the procedure.  °For the next 24 hours, DO NOT: °-Drive a car °-Operate machinery °-Drink alcoholic beverages °-Take any medication unless instructed by your physician °-Make any legal decisions or sign important papers. ° °Meals: °Start with liquid foods such as gelatin or soup. Progress to regular foods as tolerated. Avoid greasy, spicy, heavy foods. If nausea and/or vomiting occur, drink only clear liquids until the nausea and/or vomiting subsides. Call your  physician if vomiting continues. ° °Special Instructions/Symptoms: °Your throat may feel dry or sore from the anesthesia or the breathing tube placed in your throat during surgery. If this causes discomfort, gargle with warm salt water. The discomfort should disappear within 24 hours. ° °If you had a scopolamine patch placed behind your ear for the management of post- operative nausea and/or vomiting: ° °1. The medication in the patch is effective for 72 hours, after which it should be removed.  Wrap patch in a tissue and discard in the trash. Wash hands thoroughly with soap and water. °2. You may remove the patch earlier than 72 hours if you experience unpleasant side effects which may include dry mouth, dizziness or visual disturbances. °3. Avoid touching the patch. Wash your hands with soap and water after contact with the patch. °  ° °

## 2017-03-06 NOTE — Transfer of Care (Signed)
Immediate Anesthesia Transfer of Care Note  Patient: Christine Steele  Procedure(s) Performed: DILATATION & CURETTAGE/HYSTEROSCOPY WITH MYOSURE (N/A )  Patient Location: PACU  Anesthesia Type:General  Level of Consciousness: awake, alert  and oriented  Airway & Oxygen Therapy: Patient Spontanous Breathing and Patient connected to nasal cannula oxygen  Post-op Assessment: Report given to RN  Post vital signs: Reviewed and stable  Last Vitals:  Vitals:   03/06/17 0542 03/06/17 0815  BP: 137/69 105/69  Pulse: 67 61  Resp: 18 10  Temp: 37 C (!) 36.3 C  SpO2: 100% 100%    Last Pain:  Vitals:   03/06/17 0815  TempSrc:   PainSc: 5       Patients Stated Pain Goal: 8 (44/81/85 6314)  Complications: No apparent anesthesia complications

## 2017-03-09 ENCOUNTER — Encounter (HOSPITAL_BASED_OUTPATIENT_CLINIC_OR_DEPARTMENT_OTHER): Payer: Self-pay | Admitting: Obstetrics & Gynecology

## 2017-03-27 ENCOUNTER — Encounter: Payer: Self-pay | Admitting: Obstetrics & Gynecology

## 2017-03-27 ENCOUNTER — Ambulatory Visit (INDEPENDENT_AMBULATORY_CARE_PROVIDER_SITE_OTHER): Payer: Medicare Other | Admitting: Obstetrics & Gynecology

## 2017-03-27 VITALS — BP 130/80

## 2017-03-27 DIAGNOSIS — Z09 Encounter for follow-up examination after completed treatment for conditions other than malignant neoplasm: Secondary | ICD-10-CM

## 2017-03-27 NOTE — Progress Notes (Signed)
    Christine Steele 01/08/1951 [id number]        66 y.o.  G2P0011   RP: Postop HSC/Myosure/D+C 03/06/2017  HPI:  Doing very well with no pelvic pain, no abnormal vaginal discharge and no bleeding.  No fever.  Urine and bowel movements normal.  Pathology showed benign endometrial polyps.  Past medical history,surgical history, problem list, medications, allergies, family history and social history were all reviewed and documented in the EPIC chart.  Directed ROS with pertinent positives and negatives documented in the history of present illness/assessment and plan.  Exam:  Vitals:   03/27/17 0915  BP: 130/80   General appearance:  Normal  Gynecologic exam: Vulva normal.  Bimanual exam: Uterus anteverted, mobile, nontender.  No adnexal mass, nontender.  No vaginal bleeding.  Normal secretions.   Assessment/Plan:  66 y.o. G2P0011   1. Follow-up examination after gynecological surgery Good postop evolution.  Normal gynecologic exam today.  No complication.  Pathology benign endometrial polyps.  Patient informed.  Counseling done.  Will follow up in 1 year for annual gynecologic exam October 2019.  [name] MD, 10:04 AM 03/27/2017

## 2017-03-27 NOTE — Patient Instructions (Signed)
1. Follow-up examination after gynecological surgery Good postop evolution.  Normal gynecologic exam today.  No complication.  Pathology benign endometrial polyps.  Patient informed.  Counseling done.  Will follow up in 1 year for annual gynecologic exam October 2019.  Vaughan Basta, good seeing you today!

## 2017-04-03 ENCOUNTER — Telehealth: Payer: Self-pay | Admitting: *Deleted

## 2017-04-03 NOTE — Telephone Encounter (Signed)
Patient called had D&C on 03/06/17, post op visit was on 03/27/17. States this am after using bathroom and wiping she noticed blood, light cramping, going to take OTC for cramping. Only notices blood when wiping. Patient asked if normal? Monitor for now? Please advise

## 2017-04-03 NOTE — Telephone Encounter (Signed)
Pt informed

## 2017-04-03 NOTE — Telephone Encounter (Signed)
Yes, not concerning at all.  Could have mild vaginal spotting for up to 2 weeks post op as she is healing from the surgery.  Can take Ibuprofen OTC if it helps.

## 2017-05-28 DIAGNOSIS — M7989 Other specified soft tissue disorders: Secondary | ICD-10-CM | POA: Diagnosis not present

## 2017-05-28 DIAGNOSIS — M25572 Pain in left ankle and joints of left foot: Secondary | ICD-10-CM | POA: Diagnosis not present

## 2017-05-28 DIAGNOSIS — S99912A Unspecified injury of left ankle, initial encounter: Secondary | ICD-10-CM | POA: Diagnosis not present

## 2017-05-28 DIAGNOSIS — I10 Essential (primary) hypertension: Secondary | ICD-10-CM | POA: Diagnosis not present

## 2017-06-26 DIAGNOSIS — I10 Essential (primary) hypertension: Secondary | ICD-10-CM | POA: Diagnosis not present

## 2017-06-26 DIAGNOSIS — M19041 Primary osteoarthritis, right hand: Secondary | ICD-10-CM | POA: Diagnosis not present

## 2017-06-26 DIAGNOSIS — M19042 Primary osteoarthritis, left hand: Secondary | ICD-10-CM | POA: Diagnosis not present

## 2017-06-30 DIAGNOSIS — Z1211 Encounter for screening for malignant neoplasm of colon: Secondary | ICD-10-CM | POA: Diagnosis not present

## 2017-06-30 DIAGNOSIS — R635 Abnormal weight gain: Secondary | ICD-10-CM | POA: Diagnosis not present

## 2017-07-22 DIAGNOSIS — Z1211 Encounter for screening for malignant neoplasm of colon: Secondary | ICD-10-CM | POA: Diagnosis not present

## 2017-07-24 DIAGNOSIS — I10 Essential (primary) hypertension: Secondary | ICD-10-CM | POA: Diagnosis not present

## 2017-07-24 DIAGNOSIS — E559 Vitamin D deficiency, unspecified: Secondary | ICD-10-CM | POA: Diagnosis not present

## 2017-07-30 DIAGNOSIS — Z0000 Encounter for general adult medical examination without abnormal findings: Secondary | ICD-10-CM | POA: Diagnosis not present

## 2017-07-30 DIAGNOSIS — I10 Essential (primary) hypertension: Secondary | ICD-10-CM | POA: Diagnosis not present

## 2017-07-30 DIAGNOSIS — E559 Vitamin D deficiency, unspecified: Secondary | ICD-10-CM | POA: Diagnosis not present

## 2017-12-07 DIAGNOSIS — Z6831 Body mass index (BMI) 31.0-31.9, adult: Secondary | ICD-10-CM | POA: Diagnosis not present

## 2017-12-07 DIAGNOSIS — M79604 Pain in right leg: Secondary | ICD-10-CM | POA: Diagnosis not present

## 2017-12-08 DIAGNOSIS — M79604 Pain in right leg: Secondary | ICD-10-CM | POA: Diagnosis not present

## 2017-12-17 DIAGNOSIS — M79661 Pain in right lower leg: Secondary | ICD-10-CM | POA: Diagnosis not present

## 2017-12-22 DIAGNOSIS — Z6831 Body mass index (BMI) 31.0-31.9, adult: Secondary | ICD-10-CM | POA: Diagnosis not present

## 2017-12-22 DIAGNOSIS — I10 Essential (primary) hypertension: Secondary | ICD-10-CM | POA: Diagnosis not present

## 2018-01-15 DIAGNOSIS — H1032 Unspecified acute conjunctivitis, left eye: Secondary | ICD-10-CM | POA: Diagnosis not present

## 2018-01-15 DIAGNOSIS — Z23 Encounter for immunization: Secondary | ICD-10-CM | POA: Diagnosis not present

## 2018-03-25 DIAGNOSIS — R0789 Other chest pain: Secondary | ICD-10-CM | POA: Diagnosis not present

## 2018-03-25 DIAGNOSIS — I10 Essential (primary) hypertension: Secondary | ICD-10-CM | POA: Diagnosis not present

## 2018-03-30 ENCOUNTER — Telehealth: Payer: Self-pay | Admitting: Cardiovascular Disease

## 2018-03-30 NOTE — Telephone Encounter (Signed)
error 

## 2018-03-31 ENCOUNTER — Encounter: Payer: Self-pay | Admitting: Cardiovascular Disease

## 2018-03-31 ENCOUNTER — Ambulatory Visit (INDEPENDENT_AMBULATORY_CARE_PROVIDER_SITE_OTHER): Payer: Medicare Other | Admitting: Cardiovascular Disease

## 2018-03-31 VITALS — BP 130/76 | HR 58 | Ht 63.0 in | Wt 187.0 lb

## 2018-03-31 DIAGNOSIS — R0681 Apnea, not elsewhere classified: Secondary | ICD-10-CM | POA: Diagnosis not present

## 2018-03-31 DIAGNOSIS — R079 Chest pain, unspecified: Secondary | ICD-10-CM | POA: Diagnosis not present

## 2018-03-31 DIAGNOSIS — R0789 Other chest pain: Secondary | ICD-10-CM | POA: Diagnosis not present

## 2018-03-31 DIAGNOSIS — G4733 Obstructive sleep apnea (adult) (pediatric): Secondary | ICD-10-CM | POA: Diagnosis not present

## 2018-03-31 DIAGNOSIS — I10 Essential (primary) hypertension: Secondary | ICD-10-CM

## 2018-03-31 HISTORY — DX: Obstructive sleep apnea (adult) (pediatric): G47.33

## 2018-03-31 HISTORY — DX: Other chest pain: R07.89

## 2018-03-31 NOTE — Progress Notes (Signed)
Cardiology Office Note   Date:  03/31/2018   ID:  Christine Steele [birth year], MRN [id number]  PCP:  Christine Pretty, MD  Cardiologist:   Christine Latch, MD   No chief complaint on file.    History of Present Illness: Christine Steele is a 67 y.o. female with hypertension, brain aneurysm, and OSA not on CPAP who is being seen today for the evaluation of chest tightness at the request of Christine Pretty, MD.  [name] reports episodes of chest discomfort associated with L arm weakness.  In general she has been feeling weak and tired.  The chest discomfort comes and goes.  She first noticed it after she switched from bisoprolol to atenolol.  The pain is up to 8/10 in severity and is not associated with shortness of breath, nausea or diaphoresis.  It lasts up to hours at a time.  She doesn't exercise and is unsure if it is exertional.  She has mild LE edema if she eats a salty meal but not in general.  She denies orthopnea or PND.  She has a diagnosis of OSA but hasn't worn her CPAP machine in years.  Her machine broke after less than one year and it was never replaced.  Lately she isn't sleeping well.     Past Medical History:  Diagnosis Date  . Aneurysm (HCC)    51mm Right MCA  . Atypical chest pain 03/31/2018  . Cataract    Bilateral  . Chronic kidney disease    Christine Steele (PCP)   . Hypertension   . OSA (obstructive sleep apnea) 03/31/2018  . OSA on CPAP    havent used CPAP in long time per patient due to unable to get filters to change out  . Osteopenia 01/2017   T score -1.4 FRAX 3.4% / 0.4%  . Potassium (K) deficiency    no longer taking medication for potassium MD no longer prescribed  . Tachycardia   . Vitamin D deficiency     Past Surgical History:  Procedure Laterality Date  . BIOPSY BREAST  1970   Right Breast  . COLONOSCOPY    . DILATATION & CURETTAGE/HYSTEROSCOPY WITH MYOSURE N/A 03/06/2017   Procedure: DILATATION & CURETTAGE/HYSTEROSCOPY WITH MYOSURE;  Surgeon:  Christine Bruins, MD;  Location: [place];  Service: Gynecology;  Laterality: N/A;  Request 7:30am in [facility] block time  requests one hour     Current Outpatient Medications  Medication Sig Dispense Refill  . aspirin 325 MG tablet Take 325 mg every other day by mouth.     Marland Kitchen atenolol (TENORMIN) 25 MG tablet Take 25 mg by mouth daily.    . Cholecalciferol (VITAMIN D PO) Take 2,000 Units by mouth.     . hydrochlorothiazide (HYDRODIURIL) 25 MG tablet Take 25 mg by mouth daily.    Marland Kitchen losartan (COZAAR) 100 MG tablet Take 100 mg by mouth daily.     No current facility-administered medications for this visit.     Allergies:   Amlodipine; Chocolate; Coconut fatty acids; Other; and Peanut-containing drug products    Social History:  The patient  reports that she quit smoking about 38 years ago. Her smoking use included cigarettes. She has a 5.00 pack-year smoking history. She has never used smokeless tobacco. She reports that she does not drink alcohol or use drugs.   Family History:  The patient's family history includes Heart attack in her father; Hypertension in her mother; Stroke in her father.  ROS:  Please see the history of present illness.   Otherwise, review of systems are positive for none.   All other systems are reviewed and negative.    PHYSICAL EXAM: VS:  BP 130/76 Comment: right arm  Pulse (!) 58   Ht 5\' 3"  (1.6 m)   Wt 187 lb (84.8 kg)   BMI 33.13 kg/m  , BMI Body mass index is 33.13 kg/m. GENERAL:  Well appearing HEENT:  Pupils equal round and reactive, fundi not visualized, oral mucosa unremarkable NECK:  No jugular venous distention, waveform within normal limits, carotid upstroke brisk and symmetric, no bruits LUNGS:  Clear to auscultation bilaterally HEART:  RRR.  PMI not displaced or sustained,S1 and S2 within normal limits, no S3, no S4, no clicks, no rubs, no murmurs ABD:  Flat, positive bowel sounds normal in frequency in pitch, no  bruits, no rebound, no guarding, no midline pulsatile mass, no hepatomegaly, no splenomegaly EXT:  2 plus pulses throughout, no edema, no cyanosis no clubbing SKIN:  No rashes no nodules NEURO:  Cranial nerves II through XII grossly intact, motor grossly intact throughout PSYCH:  Cognitively intact, oriented to person place and time   EKG:  EKG is not ordered today.   Recent Labs: No results found for requested labs within last 8760 hours.    Lipid Panel    Component Value Date/Time   CHOL  10/28/2009 0455    153        ATP III CLASSIFICATION:  <200     mg/dL   Desirable  200-239  mg/dL   Borderline High  >=240    mg/dL   High          TRIG 53 10/28/2009 0455   HDL 60 10/28/2009 0455   CHOLHDL 2.6 10/28/2009 0455   VLDL 11 10/28/2009 0455   LDLCALC  10/28/2009 0455    82        Total Cholesterol/HDL:CHD Risk Coronary Heart Disease Risk Table                     Men   Women  1/2 Average Risk   3.4   3.3  Average Risk       5.0   4.4  2 X Average Risk   9.6   7.1  3 X Average Risk  23.4   11.0        Use the calculated Patient Ratio above and the CHD Risk Table to determine the patient's CHD Risk.        ATP III CLASSIFICATION (LDL):  <100     mg/dL   Optimal  100-129  mg/dL   Near or Above                    Optimal  130-159  mg/dL   Borderline  160-189  mg/dL   High  >190     mg/dL   Very High   LDLDIRECT 121.6 10/13/2006 1729      Wt Readings from Last 3 Encounters:  03/31/18 187 lb (84.8 kg)  03/06/17 184 lb (83.5 kg)  02/02/17 183 lb (83 kg)      ASSESSMENT AND PLAN:  # Atypical chest pain:  Symptoms are atypical.  She doesn't have any exertional symptoms but doesn't exercise.  We will get a Lexiscan Myoview to assess for ischemia.  She doesn't think she can walk on a treadmill.  # OSA: Repeat sleep study.  She hasn't had  a mask in years.  # Hypertension: BP controlled on atenolol, HCTZ and losartan.    Current medicines are reviewed at length  with the patient today.  The patient does not have concerns regarding medicines.  The following changes have been made:  no change  Labs/ tests ordered today include:   Orders Placed This Encounter  Procedures  . MYOCARDIAL PERFUSION IMAGING  . Split night study     Disposition:   FU with Christine Kruczek C. Oval Linsey, MD, [facility] in 6 weeks.     Signed, Odel Schmid C. Oval Linsey, MD, [facility]  03/31/2018 5:19 PM    [place]

## 2018-03-31 NOTE — Patient Instructions (Addendum)
Medication Instructions:  Your physician recommends that you continue on your current medications as directed. Please refer to the Current Medication list given to you today.  If you need a refill on your cardiac medications before your next appointment, please call your pharmacy.   Lab work: NONE  Testing/Procedures: Your physician has requested that you have a lexiscan myoview. For further information please visit [email or web address]. Please follow instruction sheet, as given.  Your physician has recommended that you have a sleep study. This test records several body functions during sleep, including: brain activity, eye movement, oxygen and carbon dioxide blood levels, heart rate and rhythm, breathing rate and rhythm, the flow of air through your mouth and nose, snoring, body muscle movements, and chest and belly movement. ONCE INSURANCE HAS APPROVED THIS THE OFFICE WILL CALL YOU TO SCHEDULE   Follow-Up: At [facility], you and your health needs are our priority.  As part of our continuing mission to provide you with exceptional heart care, we have created designated Provider Care Teams.  These Care Teams include your primary Cardiologist (physician) and Advanced Practice Providers (APPs -  Physician Assistants and Nurse Practitioners) who all work together to provide you with the care you need, when you need it. You will need a follow up appointment in 6 weeks. You may see DR [facility]  or one of the following Advanced Practice Providers on your designated Care Team:   Kerin Ransom, PA-C Roby Lofts, [place] . Sande Rives, PA-C  Any Other Special Instructions Will Be Listed Below (If Applicable).  Cardiac Nuclear Scan A cardiac nuclear scan is a test that measures blood flow to the heart when a person is resting and when he or she is exercising. The test looks for problems such as:  Not enough blood reaching a portion of the heart.  The heart muscle not working normally.  You  may need this test if:  You have heart disease.  You have had abnormal lab results.  You have had heart surgery or angioplasty.  You have chest pain.  You have shortness of breath.  In this test, a radioactive dye (tracer) is injected into your bloodstream. After the tracer has traveled to your heart, an imaging device is used to measure how much of the tracer is absorbed by or distributed to various areas of your heart. This procedure is usually done at a hospital and takes 2-4 hours. Tell a health care provider about:  Any allergies you have.  All medicines you are taking, including vitamins, herbs, eye drops, creams, and over-the-counter medicines.  Any problems you or family members have had with the use of anesthetic medicines.  Any blood disorders you have.  Any surgeries you have had.  Any medical conditions you have.  Whether you are pregnant or may be pregnant. What are the risks? Generally, this is a safe procedure. However, problems may occur, including:  Serious chest pain and heart attack. This is only a risk if the stress portion of the test is done.  Rapid heartbeat.  Sensation of warmth in your chest. This usually passes quickly.  What happens before the procedure?  Ask your health care provider about changing or stopping your regular medicines. This is especially important if you are taking diabetes medicines or blood thinners.  Remove your jewelry on the day of the procedure. What happens during the procedure?  An IV tube will be inserted into one of your veins.  Your health care provider will inject a small  amount of radioactive tracer through the tube.  You will wait for 20-40 minutes while the tracer travels through your bloodstream.  Your heart activity will be monitored with an electrocardiogram (ECG).  You will lie down on an exam table.  Images of your heart will be taken for about 15-20 minutes.  You may be asked to exercise on a  treadmill or stationary bike. While you exercise, your heart's activity will be monitored with an ECG, and your blood pressure will be checked. If you are unable to exercise, you may be given a medicine to increase blood flow to parts of your heart.  When blood flow to your heart has peaked, a tracer will again be injected through the IV tube.  After 20-40 minutes, you will get back on the exam table and have more images taken of your heart.  When the procedure is over, your IV tube will be removed. The procedure may vary among health care providers and hospitals. Depending on the type of tracer used, scans may need to be repeated 3-4 hours later. What happens after the procedure?  Unless your health care provider tells you otherwise, you may return to your normal schedule, including diet, activities, and medicines.  Unless your health care provider tells you otherwise, you may increase your fluid intake. This will help flush the contrast dye from your body. Drink enough fluid to keep your urine clear or pale yellow.  It is up to you to get your test results. Ask your health care provider, or the department that is doing the test, when your results will be ready. Summary  A cardiac nuclear scan measures the blood flow to the heart when a person is resting and when he or she is exercising.  You may need this test if you are at risk for heart disease.  Tell your health care provider if you are pregnant.  Unless your health care provider tells you otherwise, increase your fluid intake. This will help flush the contrast dye from your body. Drink enough fluid to keep your urine clear or pale yellow. This information is not intended to replace advice given to you by your health care provider. Make sure you discuss any questions you have with your health care provider. Document Released: 05/02/2004 Document Revised: 04/09/2016 Document Reviewed: 03/16/2013 Elsevier Interactive Patient Education   2017 [place].   Sleep Studies A sleep study (polysomnogram) is a series of tests done while you are sleeping. It can show how well you sleep. This can help your health care provider diagnose a sleep disorder and show how severe your sleep disorder is. A sleep study may lead to treatment that will help you sleep better and prevent other medical problems caused by poor sleep. If you have a sleep disorder, you may also be at risk for:  Sleep-related accidents.  High blood pressure.  Heart disease.  Stroke.  Other medical conditions.  Sleep disorders are common. Your health care provider may suspect a sleep disorder if you:  Have loud snoring most nights.  Have brief periods when you stop breathing at night.  Feel sleepy on most days.  Fall asleep suddenly during the day.  Have trouble falling asleep or staying asleep.  Feel like you need to move your legs when trying to fall asleep.  Have dreams that seem very real shortly after falling asleep.  Feel like you cannot move when you first wake up.  Which tests will I need to have? Most sleep  studies last all night and include these tests:  Recordings of your brain activity.  Recordings of your eye movements.  Recording of your heart rate and rhythm.  Blood pressure readings.  Readings of the amount of oxygen in your blood.  Measurements of your chest and belly movement as you breathe during sleep.  If you have signs of the sleep disorder called sleep apnea during your test, you may get a mask to wear for the second half of the night.  The mask provides continuous positive airway pressure (CPAP). This may improve sleep apnea significantly.  You will then have all tests done again with the mask in place to see if your measurements and recordings change.  How are sleep studies done? Most sleep studies are done over one full night of sleep.  You will arrive at the study center in the evening and can go home in  the morning.  Bring your pajamas and toothbrush.  Do not have caffeine on the day of your sleep study.  Your health care provider will let you know if you need to stop taking any of your regular medicines before the test.  To do the tests included in a polysomnogram, you will have:  Round, sticky patches with sensors attached to recording wires (electrodes) placed on your scalp, face, chest, and limbs.  Wires from all the electrodes and sensors run from your bed to a computer. The wires can be taken off and put back on if you need to get out of bed to go to the bathroom.  A sensor placed over your nose to measure airflow.  A finger clip put on one finger to measure your blood oxygen level.  A belt around your belly and a belt around your chest to measure breathing movements.  Where are sleep studies done? Sleep studies are done at sleep centers. A sleep center may be inside a hospital, office, or clinic. The room where you have the study may look like a hospital room or a hotel room. The health care providers doing the study may come in and out of the room during the study. Most of the time, they will be in another room monitoring your test. How is information from sleep studies helpful? A polysomnogram can be used along with your medical history and a physical exam to diagnose conditions, such as:  Sleep apnea.  Restless legs syndrome.  Sleep-related seizure disorders.  Sleep-related movement disorders.  A medical doctor who specializes in sleep will evaluate your sleep study. The specialist will share the results with your primary health care provider. Treatments based on your sleep study may include:  Improving your sleep habits (sleep hygiene).  Wearing a CPAP mask.  Wearing an oral device at night to improve breathing and reduce snoring.  Taking medicine for: ? Restless legs syndrome. ? Sleep-related seizure disorder. ? Sleep-related movement disorder.  This  information is not intended to replace advice given to you by your health care provider. Make sure you discuss any questions you have with your health care provider. Document Released: 10/12/2002 Document Revised: 12/02/2015 Document Reviewed: 06/13/2013 Elsevier Interactive Patient Education  [employer].

## 2018-04-01 ENCOUNTER — Telehealth (HOSPITAL_COMMUNITY): Payer: Self-pay

## 2018-04-01 ENCOUNTER — Telehealth: Payer: Self-pay | Admitting: *Deleted

## 2018-04-01 NOTE — Telephone Encounter (Signed)
Left message to return a call for sleep study appointment details. 

## 2018-04-01 NOTE — Telephone Encounter (Signed)
Encounter complete. 

## 2018-04-02 ENCOUNTER — Ambulatory Visit (HOSPITAL_COMMUNITY)
Admission: RE | Admit: 2018-04-02 | Discharge: 2018-04-02 | Disposition: A | Discharge status: Home / Self Care | Payer: Medicare Other | Source: Ambulatory Visit | Attending: Cardiology | Admitting: Cardiology

## 2018-04-02 DIAGNOSIS — R079 Chest pain, unspecified: Secondary | ICD-10-CM | POA: Diagnosis not present

## 2018-04-02 LAB — MYOCARDIAL PERFUSION IMAGING
LV dias vol: 72 mL (ref 46–106)
LV sys vol: 35 mL
Peak HR: 86 {beats}/min
Rest HR: 51 {beats}/min
SDS: 2
SRS: 0
SSS: 2
TID: 1.08

## 2018-04-02 MED ORDER — AMINOPHYLLINE 25 MG/ML IV SOLN
75.0000 mg | Freq: Once | INTRAVENOUS | Status: AC
  Administered 2018-04-02: 75 mg via INTRAVENOUS

## 2018-04-02 MED ORDER — TECHNETIUM TC 99M TETROFOSMIN IV KIT
30.4000 | PACK | Freq: Once | INTRAVENOUS | Status: AC | PRN
  Administered 2018-04-02: 30.4 via INTRAVENOUS
  Filled 2018-04-02: qty 31

## 2018-04-02 MED ORDER — REGADENOSON 0.4 MG/5ML IV SOLN
0.4000 mg | Freq: Once | INTRAVENOUS | Status: AC
  Administered 2018-04-02: 0.4 mg via INTRAVENOUS

## 2018-04-02 MED ORDER — TECHNETIUM TC 99M TETROFOSMIN IV KIT
10.6000 | PACK | Freq: Once | INTRAVENOUS | Status: AC | PRN
  Administered 2018-04-02: 10.6 via INTRAVENOUS
  Filled 2018-04-02: qty 11

## 2018-04-02 NOTE — Telephone Encounter (Signed)
Left message to return a call to get sleep study appointment.

## 2018-04-06 NOTE — Telephone Encounter (Signed)
Left message to return a call. 

## 2018-05-11 ENCOUNTER — Telehealth: Payer: Self-pay | Admitting: *Deleted

## 2018-05-11 NOTE — Telephone Encounter (Signed)
Left message to return a call to discuss sleep appointment.

## 2018-05-12 NOTE — Telephone Encounter (Signed)
Patient notified of sleep study appointment details.

## 2018-05-17 ENCOUNTER — Ambulatory Visit (HOSPITAL_BASED_OUTPATIENT_CLINIC_OR_DEPARTMENT_OTHER): Payer: Medicare Other | Attending: Cardiovascular Disease | Admitting: Cardiovascular Disease

## 2018-05-17 VITALS — Ht 63.0 in | Wt 180.0 lb

## 2018-05-17 DIAGNOSIS — Z7982 Long term (current) use of aspirin: Secondary | ICD-10-CM | POA: Insufficient documentation

## 2018-05-17 DIAGNOSIS — Z79899 Other long term (current) drug therapy: Secondary | ICD-10-CM | POA: Insufficient documentation

## 2018-05-17 DIAGNOSIS — R0902 Hypoxemia: Secondary | ICD-10-CM | POA: Diagnosis not present

## 2018-05-17 DIAGNOSIS — G4730 Sleep apnea, unspecified: Secondary | ICD-10-CM | POA: Diagnosis not present

## 2018-05-17 DIAGNOSIS — G4733 Obstructive sleep apnea (adult) (pediatric): Secondary | ICD-10-CM | POA: Insufficient documentation

## 2018-05-17 DIAGNOSIS — R0681 Apnea, not elsewhere classified: Secondary | ICD-10-CM

## 2018-05-18 ENCOUNTER — Ambulatory Visit (INDEPENDENT_AMBULATORY_CARE_PROVIDER_SITE_OTHER): Payer: Medicare Other | Admitting: Cardiovascular Disease

## 2018-05-18 ENCOUNTER — Encounter: Payer: Self-pay | Admitting: Cardiovascular Disease

## 2018-05-18 VITALS — BP 129/73 | HR 56 | Ht 63.0 in | Wt 183.0 lb

## 2018-05-18 DIAGNOSIS — G4733 Obstructive sleep apnea (adult) (pediatric): Secondary | ICD-10-CM | POA: Diagnosis not present

## 2018-05-18 DIAGNOSIS — I10 Essential (primary) hypertension: Secondary | ICD-10-CM

## 2018-05-18 DIAGNOSIS — R0789 Other chest pain: Secondary | ICD-10-CM

## 2018-05-18 NOTE — Progress Notes (Signed)
Cardiology Office Note   Date:  05/18/2018   ID:  Christine Steele 06-Feb-1951, MRN [id number]  PCP:  Christine Pretty, MD  Cardiologist:   Skeet Latch, MD   Chief Complaint  Steele presents with  . Follow-up    Pt states no Sx .      History of Present Illness: Christine Steele is a 69 y.o. female with hypertension, brain aneurysm, and OSA on CPAP here for follow up.  She was initially seen 03/2018 for chest tightness.  Christine Steele reported episodes of chest discomfort associated with L arm weakness.  In general she has been feeling weak and tired.  Christine chest discomfort comes and goes.  She first noticed it after she switched from bisoprolol to atenolol.  Christine pain is up to 8/10 in severity and is not associated with shortness of breath, nausea or diaphoresis.  She was referred for a Lexiscan Myoview 03/2018 that was negative for ischemia.  She also has a diagnosis of OSA but hasn't worn her CPAP machine in years.  Her machine broke after less than one year and it was never replaced.  She was referred for a sleep study which she had last night.  Christine Steele has been feeling well.  Since her last appointment she had one episode of chest tightness.  She doesn't recall what she was doing at Christine time.  She has been feeling generally well.  Her main complaint is plantar fasciitis, which is improving.  She hopes to start exercising soon.  He has no shortness of breath, lower extremity edema, orthopnea or PND.    Past Medical History:  Diagnosis Date  . Aneurysm (HCC)    54mm Right MCA  . Atypical chest pain 03/31/2018  . Cataract    Bilateral  . Chronic kidney disease    Dr. Shelia Media (PCP)   . Hypertension   . OSA (obstructive sleep apnea) 03/31/2018  . OSA on CPAP    havent used CPAP in long time per Steele due to unable to get filters to change out  . Osteopenia 01/2017   T score -1.4 FRAX 3.4% / 0.4%  . Potassium (K) deficiency    no longer taking medication for potassium MD no longer  prescribed  . Tachycardia   . Vitamin D deficiency     Past Surgical History:  Procedure Laterality Date  . BIOPSY BREAST  1970   Right Breast  . COLONOSCOPY    . DILATATION & CURETTAGE/HYSTEROSCOPY WITH MYOSURE N/A 03/06/2017   Procedure: DILATATION & CURETTAGE/HYSTEROSCOPY WITH MYOSURE;  Surgeon: [name], MD;  Location: [place];  Service: Gynecology;  Laterality: N/A;  Request 7:30am in [facility] block time  requests one hour     Current Outpatient Medications  Medication Sig Dispense Refill  . aspirin 325 MG tablet Take 325 mg every other day by mouth.     Marland Kitchen atenolol (TENORMIN) 25 MG tablet Take 25 mg by mouth daily.    . Cholecalciferol (VITAMIN D PO) Take 2,000 Units by mouth.     . hydrochlorothiazide (HYDRODIURIL) 25 MG tablet Take 25 mg by mouth daily.    Marland Kitchen losartan (COZAAR) 100 MG tablet Take 100 mg by mouth daily.     No current facility-administered medications for this visit.     Allergies:   Amlodipine; Chocolate; Coconut fatty acids; Other; and Peanut-containing drug products    Social History:  Christine Steele  reports that she quit smoking about 82  years ago. Her smoking use included cigarettes. She has a 5.00 pack-year smoking history. She has never used smokeless tobacco. She reports that she does not drink alcohol or use drugs.   Family History:  Christine Steele's family history includes Heart attack in her father; Hypertension in her mother; Stroke in her father.    ROS:  Please see Christine history of present illness.   Otherwise, review of systems are positive for none.   All other systems are reviewed and negative.    PHYSICAL EXAM: VS:  BP 129/73   Pulse (!) 56   Ht 5\' 3"  (1.6 m)   Wt 183 lb (83 kg)   BMI 32.42 kg/m  , BMI Body mass index is 32.42 kg/m. GENERAL:  Well appearing HEENT: Pupils equal round and reactive, fundi not visualized, oral mucosa unremarkable NECK:  No jugular venous distention, waveform within normal  limits, carotid upstroke brisk and symmetric, no bruits LUNGS:  Clear to auscultation bilaterally HEART:  RRR.  PMI not displaced or sustained,S1 and S2 within normal limits, no S3, no S4, no clicks, no rubs, no murmurs ABD:  Flat, positive bowel sounds normal in frequency in pitch, no bruits, no rebound, no guarding, no midline pulsatile mass, no hepatomegaly, no splenomegaly EXT:  2 plus pulses throughout, no edema, no cyanosis no clubbing SKIN:  No rashes no nodules NEURO:  Cranial nerves II through XII grossly intact, motor grossly intact throughout PSYCH:  Cognitively intact, oriented to person place and time   EKG:  EKG is not ordered today.  Lexiscan Myoview  04/02/18:  Nuclear stress EF: 52%. Christine left ventricular ejection fraction is mildly decreased (45-54%).  T wave inversion was noted during stress in Christine V4, V5, aVF and V3 leads.  This is a low risk study. No evidence of ischemia. No previous MI  Christine study is normal.     Recent Labs: No results found for requested labs within last 8760 hours.    Lipid Panel    Component Value Date/Time   CHOL  10/28/2009 0455    153        ATP III CLASSIFICATION:  <200     mg/dL   Desirable  200-239  mg/dL   Borderline High  >=240    mg/dL   High          TRIG 53 10/28/2009 0455   HDL 60 10/28/2009 0455   CHOLHDL 2.6 10/28/2009 0455   VLDL 11 10/28/2009 0455   LDLCALC  10/28/2009 0455    82        Total Cholesterol/HDL:CHD Risk Coronary Heart Disease Risk Table                     Men   Women  1/2 Average Risk   3.4   3.3  Average Risk       5.0   4.4  2 X Average Risk   9.6   7.1  3 X Average Risk  23.4   11.0        Use Christine calculated Steele Ratio above and Christine CHD Risk Table to determine Christine Steele's CHD Risk.        ATP III CLASSIFICATION (LDL):  <100     mg/dL   Optimal  100-129  mg/dL   Near or Above                    Optimal  130-159  mg/dL   Borderline  160-189  mg/dL   High  >190     mg/dL   Very  High   LDLDIRECT 121.6 10/13/2006 1729      Wt Readings from Last 3 Encounters:  05/18/18 183 lb (83 kg)  05/17/18 180 lb (81.6 kg)  04/02/18 187 lb (84.8 kg)      ASSESSMENT AND PLAN:  # Atypical chest pain:  Lexiscan Myoview was negative for ischemia.  She is doing well and plans to start exercising soon.   # OSA: Sleep study results pending.   # Hypertension: BP controlled on atenolol, HCTZ and losartan.    Current medicines are reviewed at length with Christine Steele today.  Christine Steele does not have concerns regarding medicines.  Christine following changes have been made:  no change  Labs/ tests ordered today include:   Orders Placed This Encounter  Procedures  . EKG 12-Lead     Disposition:   FU with Willard Farquharson C. Oval Linsey, MD, [facility] as needed.     Signed, Joselynn Amoroso C. Oval Linsey, MD, [facility]  05/18/2018 6:25 PM    [place]

## 2018-05-18 NOTE — Patient Instructions (Signed)
Medication Instructions:  Your physician recommends that you continue on your current medications as directed. Please refer to the Current Medication list given to you today.  If you need a refill on your cardiac medications before your next appointment, please call your pharmacy.   Lab work: NONE  Testing/Procedures: NONE  Follow-Up: AS NEEDED   

## 2018-06-06 ENCOUNTER — Encounter (HOSPITAL_BASED_OUTPATIENT_CLINIC_OR_DEPARTMENT_OTHER): Payer: Self-pay | Admitting: Cardiovascular Disease

## 2018-06-06 NOTE — Procedures (Signed)
Patient Name: Christine, Steele Date: 05/17/2018 Gender: Female D.O.B: 1950-06-20 Age (years): 67 Referring Provider: Skeet Latch Height (inches): 63 Interpreting Physician: Shelva Majestic MD, [facility] Weight (lbs): 180 RPSGT: Carolin Coy BMI: 32 MRN: [id number] Neck Size: 13.50  CLINICAL INFORMATION Sleep Study Type: NPSG  Indication for sleep study: Hypertension  Epworth Sleepiness Score: 2  SLEEP STUDY TECHNIQUE As per the AASM Manual for the Scoring of Sleep and Associated Events v2.3 (April 2016) with a hypopnea requiring 4% desaturations.  The channels recorded and monitored were frontal, central and occipital EEG, electrooculogram (EOG), submentalis EMG (chin), nasal and oral airflow, thoracic and abdominal wall motion, anterior tibialis EMG, snore microphone, electrocardiogram, and pulse oximetry.  MEDICATIONS     aspirin 325 MG tablet         atenolol (TENORMIN) 25 MG tablet         Cholecalciferol (VITAMIN D PO)         hydrochlorothiazide (HYDRODIURIL) 25 MG tablet         losartan (COZAAR) 100 MG tablet      Medications self-administered by patient taken the night of the study : N/A  SLEEP ARCHITECTURE The study was initiated at 10:23:15 PM and ended at 5:08:05 AM.  Sleep onset time was 8.7 minutes and the sleep efficiency was 90.7%%. The total sleep time was 367 minutes.  Stage REM latency was 90.5 minutes.  The patient spent 7.1%% of the night in stage N1 sleep, 68.9%% in stage N2 sleep, 0.0%% in stage N3 and 24% in REM.  Alpha intrusion was absent.  Supine sleep was 14.58%.  RESPIRATORY PARAMETERS The overall apnea/hypopnea index (AHI) was 22.4 per hour. The respiratory disturbance index (RDI) was 26.8/h. There were 19 total apneas, including 16 obstructive, 3 central and 0 mixed apneas. There were 118 hypopneas and 27 RERAs.  The AHI during Stage REM sleep was 49.8 per hour.  AHI while supine was 28.0 per hour.  The mean oxygen  saturation was 93.3%. The minimum SpO2 during sleep was 78.0%.  Moderate snoring was noted during this study.  CARDIAC DATA The 2 lead EKG demonstrated sinus rhythm. The mean heart rate was 59.2 beats per minute. Other EKG findings include: PVCs.  LEG MOVEMENT DATA The total PLMS were 0 with a resulting PLMS index of 0.0. Associated arousal with leg movement index was 0.0 .  IMPRESSIONS - Moderate obstructive sleep apnea overall (AHI 22.4/h); however, events were worse with supine sleep (AHI 28.0%) and severe during REM sleep (AHI 49.8/h). - No significant central sleep apnea occurred during this study (CAI = 0.5/h). - Significant oxygen desaturation to a nadir of 78%. - The patient snored with moderate snoring volume. - EKG findings include PVCs. - Clinically significant periodic limb movements did not occur during sleep. No significant associated arousals.  DIAGNOSIS - Obstructive Sleep Apnea (327.23 [G47.33 ICD-10]) - Nocturnal Hypoxemia (327.26 [G47.36 ICD-10])  RECOMMENDATIONS - Therapeutic CPAP titration to determine optimal pressure required to alleviate sleep disordered breathing. - Efforts should be made to optimize nasal and oropharyngeal patency.  - Avoid alcohol, sedatives and other CNS depressants that may worsen sleep apnea and disrupt normal sleep architecture. - Sleep hygiene should be reviewed to assess factors that may improve sleep quality. - Weight management (BMI 32) and regular exercise should be initiated or continued if appropriate.  [Electronically signed] 06/06/2018 01:04 PM  Shelva Majestic MD, [facility], [place], American Board of Sleep Medicine   NPI: [id number]  [facility]  [facility] PH: [phone]   FX: [phone] [place]

## 2018-06-07 ENCOUNTER — Other Ambulatory Visit: Payer: Self-pay | Admitting: Cardiovascular Disease

## 2018-06-07 ENCOUNTER — Telehealth: Payer: Self-pay | Admitting: *Deleted

## 2018-06-07 DIAGNOSIS — G4736 Sleep related hypoventilation in conditions classified elsewhere: Secondary | ICD-10-CM

## 2018-06-07 DIAGNOSIS — IMO0002 Reserved for concepts with insufficient information to code with codable children: Secondary | ICD-10-CM

## 2018-06-07 DIAGNOSIS — G4733 Obstructive sleep apnea (adult) (pediatric): Secondary | ICD-10-CM

## 2018-06-07 NOTE — Telephone Encounter (Signed)
-----   Message from Troy Sine, MD sent at 06/06/2018  1:08 PM EST ----- Mariann Laster, please notify pt and schedule for CPAP titration study

## 2018-06-07 NOTE — Telephone Encounter (Signed)
Left message to return a call to discuss sleep study results and recommendations. 

## 2018-06-10 NOTE — Telephone Encounter (Signed)
Patient returned a call and was given sleep study results and recommendations. She voiced understanding and agrees to proceed with CPAP titration study scheduled on 06/20/18.

## 2018-06-10 NOTE — Telephone Encounter (Signed)
Left message to return a call to discuss sleep study results. 

## 2018-06-10 NOTE — Telephone Encounter (Signed)
-----   Message from Troy Sine, MD sent at 06/06/2018  1:08 PM EST ----- Mariann Laster, please notify pt and schedule for CPAP titration study

## 2018-06-20 ENCOUNTER — Ambulatory Visit (HOSPITAL_BASED_OUTPATIENT_CLINIC_OR_DEPARTMENT_OTHER): Payer: Medicare Other | Attending: Cardiovascular Disease | Admitting: Cardiovascular Disease

## 2018-06-20 VITALS — Ht 63.0 in | Wt 183.0 lb

## 2018-06-20 DIAGNOSIS — G4736 Sleep related hypoventilation in conditions classified elsewhere: Secondary | ICD-10-CM | POA: Diagnosis not present

## 2018-06-20 DIAGNOSIS — G4733 Obstructive sleep apnea (adult) (pediatric): Secondary | ICD-10-CM | POA: Insufficient documentation

## 2018-06-20 DIAGNOSIS — IMO0002 Reserved for concepts with insufficient information to code with codable children: Secondary | ICD-10-CM

## 2018-06-26 ENCOUNTER — Encounter (HOSPITAL_BASED_OUTPATIENT_CLINIC_OR_DEPARTMENT_OTHER): Payer: Self-pay | Admitting: Cardiovascular Disease

## 2018-06-26 NOTE — Procedures (Signed)
Patient Name: Christine Steele, Christine Steele Date: 06/20/2018 Gender: Female D.O.B: 08/12/1950 Age (years): 67 Referring Provider: Skeet Latch Height (inches): 63 Interpreting Physician: Shelva Majestic MD, ABSM Weight (lbs): 180 RPSGT: Lanae Boast BMI: 32 MRN: [id number] Neck Size: 13.50  CLINICAL INFORMATION The patient is referred for a CPAP titration to treat sleep apnea.  Date of NPSG: 05/17/2018:  AHI 22.4/h; RDI 26.8/h; REM AHI 49.8/h; O2 nadir 78%.  SLEEP STUDY TECHNIQUE As per the AASM Manual for the Scoring of Sleep and Associated Events v2.3 (April 2016) with a hypopnea requiring 4% desaturations.  The channels recorded and monitored were frontal, central and occipital EEG, electrooculogram (EOG), submentalis EMG (chin), nasal and oral airflow, thoracic and abdominal wall motion, anterior tibialis EMG, snore microphone, electrocardiogram, and pulse oximetry. Continuous positive airway pressure (CPAP) was initiated at the beginning of the study and titrated to treat sleep-disordered breathing.  MEDICATIONS     aspirin 325 MG tablet             atenolol (TENORMIN) 25 MG tablet         Cholecalciferol (VITAMIN D PO)         hydrochlorothiazide (HYDRODIURIL) 25 MG tablet         losartan (COZAAR) 100 MG tablet      Medications self-administered by patient taken the night of the study : N/A  TECHNICIAN COMMENTS Comments added by technician: Patient had difficulty maintaining sleep Comments added by scorer: N/A  RESPIRATORY PARAMETERS Optimal PAP Pressure (cm): 10 AHI at Optimal Pressure (/hr): 0.0 Overall Minimal O2 (%): 88.0 Supine % at Optimal Pressure (%): 100 Minimal O2 at Optimal Pressure (%): 95.0   SLEEP ARCHITECTURE The study was initiated at 10:00:05 PM and ended at 4:58:45 AM.  Sleep onset time was 21.3 minutes and the sleep efficiency was 61.1%%. The total sleep time was 255.8 minutes.  The patient spent 4.5%% of the night in stage N1 sleep,  66.6%% in stage N2 sleep, 0.0%% in stage N3 and 28.9% in REM.Stage REM latency was 104.5 minutes  Wake after sleep onset was 141.5. Alpha intrusion was absent. Supine sleep was 100.00%.  CARDIAC DATA The 2 lead EKG demonstrated sinus rhythm. The mean heart rate was 63.1 beats per minute. Other EKG findings include: PVCs.  LEG MOVEMENT DATA The total Periodic Limb Movements of Sleep (PLMS) were 0. The PLMS index was 0.0. A PLMS index of <15 is considered normal in adults.  IMPRESSIONS - CPAP was initiated at 5 cm and was titrated to PAP pressure of 10 cm of water; AHI at 10 cm 0; O2 nadir 95%. - Central sleep apnea was not noted during this titration (CAI = 0.9/h). - Mild oxygen desaturations to a nadir of 885 at 8 cm.  - The patient snored with moderate snoring volume during this titration study. - 2-lead EKG demonstrated: PVCs - Clinically significant periodic limb movements were not noted during this study. Arousals associated with PLMs were rare.  DIAGNOSIS - Obstructive Sleep Apnea (327.23 [G47.33 ICD-10])  RECOMMENDATIONS - Recommend an initial trial of CPAP therapy with EPR at 10 cm H2O with heated humidification.  A small size Resmed Nasal Pillow Mask AirFit P30 mask was used for the titration.  - Effort should be made to optimize nasal and oropharyngeal patency.  - Avoid alcohol, sedatives and other CNS depressants that may worsen sleep apnea and disrupt normal sleep architecture. - Sleep hygiene should be reviewed to assess factors that may improve sleep quality. - Weight  management and regular exercise should be initiated or continued. - Recommend a download in 30 days and sleep clinic evaluation after 4 weeks of therapy.   [Electronically signed] 06/26/2018 10:53 AM  Shelva Majestic MD, [facility], [place], American Board of Sleep Medicine   NPI: [id number]  [place] PH: [phone]   FX: [phone] [place]

## 2018-06-29 ENCOUNTER — Telehealth: Payer: Self-pay | Admitting: *Deleted

## 2018-06-30 NOTE — Telephone Encounter (Signed)
Patient returned a call and was informed sleep study completed.

## 2018-06-30 NOTE — Progress Notes (Signed)
Patient notified sleep study completed. Referral has been done for her to get CPAP machine.

## 2018-07-09 DIAGNOSIS — Z23 Encounter for immunization: Secondary | ICD-10-CM | POA: Diagnosis not present

## 2018-07-09 DIAGNOSIS — J302 Other seasonal allergic rhinitis: Secondary | ICD-10-CM | POA: Diagnosis not present

## 2018-07-27 DIAGNOSIS — I10 Essential (primary) hypertension: Secondary | ICD-10-CM | POA: Diagnosis not present

## 2018-07-27 DIAGNOSIS — N390 Urinary tract infection, site not specified: Secondary | ICD-10-CM | POA: Diagnosis not present

## 2018-07-27 DIAGNOSIS — E559 Vitamin D deficiency, unspecified: Secondary | ICD-10-CM | POA: Diagnosis not present

## 2018-08-09 DIAGNOSIS — Z0000 Encounter for general adult medical examination without abnormal findings: Secondary | ICD-10-CM | POA: Diagnosis not present

## 2018-08-09 DIAGNOSIS — I10 Essential (primary) hypertension: Secondary | ICD-10-CM | POA: Diagnosis not present

## 2018-08-09 DIAGNOSIS — G4733 Obstructive sleep apnea (adult) (pediatric): Secondary | ICD-10-CM | POA: Diagnosis not present

## 2018-08-25 ENCOUNTER — Telehealth: Payer: Self-pay | Admitting: *Deleted

## 2018-08-25 NOTE — Telephone Encounter (Signed)
-----   Message from Skeet Latch, MD sent at 08/25/2018 10:48 AM EDT ----- Cholesterol is borderline.  Work on limiting fried foods, fatty food and cheese.  Exercise at least 150 minutes weekly.  Why is she on aspirin 325mg ?  It isn't clear that she needs aspirin at all.  If she wants to take it for prevention she should reduce to 81mg .

## 2018-08-25 NOTE — Telephone Encounter (Signed)
Left message to call back  

## 2018-08-25 NOTE — Telephone Encounter (Signed)
° ° °  Please return call to patient °

## 2018-08-25 NOTE — Telephone Encounter (Signed)
Advised patient, verbalized understanding. She is alternating the ASA 81 mg daily with 2 daily secondary to increased to bleeding/bruising.  This was suggested by physician who was treating her brain aneurysm. Will forward to Dr Oval Linsey for review

## 2018-08-27 ENCOUNTER — Telehealth: Payer: Self-pay | Admitting: *Deleted

## 2018-08-27 NOTE — Telephone Encounter (Signed)
Returned a call to patient. She has some concerns that her nasal pillows are causing her nose to become sore. She also has some concerns that she  Has gained some weight and her husband states she has started to snore. She will call [facility] about her mask. I will get a download and send to Dr Claiborne Billings for review and recommendations.patient is ok with this approach.

## 2018-08-27 NOTE — Telephone Encounter (Signed)
I will leave that up to them but this seems odd.

## 2018-08-27 NOTE — Telephone Encounter (Signed)
Spoke with patient and she has questions regarding he CPAP machine. Will forward to [employer]

## 2018-08-27 NOTE — Telephone Encounter (Signed)
Advised patient, verbalized understanding  

## 2018-09-21 ENCOUNTER — Telehealth: Payer: Self-pay | Admitting: Cardiovascular Disease

## 2018-09-21 NOTE — Telephone Encounter (Signed)
Patient returned your call.

## 2018-09-22 ENCOUNTER — Telehealth: Payer: Medicare Other | Admitting: Cardiovascular Disease

## 2018-09-22 NOTE — Telephone Encounter (Signed)
smartphone/ my chart active/ consent/ pre reg completed  °

## 2018-09-23 ENCOUNTER — Telehealth (INDEPENDENT_AMBULATORY_CARE_PROVIDER_SITE_OTHER): Payer: Medicare Other | Admitting: Cardiovascular Disease

## 2018-09-23 ENCOUNTER — Encounter: Payer: Self-pay | Admitting: Cardiovascular Disease

## 2018-09-23 VITALS — BP 128/77 | HR 57 | Ht 63.0 in | Wt 185.0 lb

## 2018-09-23 DIAGNOSIS — E669 Obesity, unspecified: Secondary | ICD-10-CM

## 2018-09-23 DIAGNOSIS — G4733 Obstructive sleep apnea (adult) (pediatric): Secondary | ICD-10-CM | POA: Diagnosis not present

## 2018-09-23 DIAGNOSIS — I10 Essential (primary) hypertension: Secondary | ICD-10-CM

## 2018-09-23 NOTE — Progress Notes (Signed)
Virtual Visit via Telephone Note   This visit type was conducted due to national recommendations for restrictions regarding the COVID-19 Pandemic (e.g. social distancing) in an effort to limit this patient's exposure and mitigate transmission in our community.  Due to her co-morbid illnesses, this patient is at least at moderate risk for complications without adequate follow up.  This format is felt to be most appropriate for this patient at this time.  The patient did not have access to video technology/had technical difficulties with video requiring transitioning to audio format only (telephone).  All issues noted in this document were discussed and addressed.  No physical exam could be performed with this format.  Please refer to the patient's chart for her  consent to telehealth for [facility].   Date:  09/23/2018   ID:  Ulyses Jarred, DOB 09-11-1950, MRN [id number]  Patient Location: Home Provider Location: Home  PCP:  [name], MD  Cardiologist:  Berneice Gandy, MD, Corky Downs, MD (sleep) Electrophysiologist:  None   Evaluation Performed:  New sleep evaluaiton Chief Complaint:  Sleep Apnea  History of Present Illness:    Christine Steele is a 68 y.o. female who is followed by Dr. Skeet Latch for her cardiology care.  She has a history of hypertension, as well as a brain aneurysm.  She states that approximately 10 years ago she had a sleep study in [place].  She initiated CPAP therapy but only used it for a short duration.  She never saw a sleep doctor in follow-up.  The company ultimately dissolved and she ultimately stopped using CPAP.  She has seen Dr. Oval Linsey and because of complaints of loud snoring, nonrestorative sleep, frequent awakenings she was referred for a sleep study.  A diagnostic polysomnogram was done on May 17, 2018 which confirmed significant sleep apnea.  Overall sleep apnea was moderate with an AHI of 22.4/h.  However during REM sleep, sleep apnea was  severe with an AHI of 49.8/h.  She had significant oxygen desaturation to a nadir of 78%.  There was moderate snoring. On March 1,2020 she underwent a CPAP titration and CPAP was titrated up to 10 cm water pressure.  Oxygen nadir was 95% and AHI during that study at 10 cm was 0.  She initiated CPAP therapy in late April 2020.  She has noticed that she is sleeping better since initiating CPAP therapy.  Her DME company is [employer].  A download was obtained from Aug 24, 2018 through September 22, 2018.  She is meeting compliance standards and usage days was 90%.  3 days that she did not use therapy was because she had cleaned her tubing and it was still significantly wet and therefore she did not use it those nights.  At 10 cm water pressure, AHI is improved but still elevated at 10.9/h.  She has been using nasal pillows.  She presents for initial sleep evaluation with me.  The patient does not have symptoms concerning for COVID-19 infection (fever, chills, cough, or new shortness of breath).    Past Medical History:  Diagnosis Date  . Aneurysm (HCC)    9mm Right MCA  . Atypical chest pain 03/31/2018  . Cataract    Bilateral  . Chronic kidney disease    Dr. Shelia Media (PCP)   . Hypertension   . OSA (obstructive sleep apnea) 03/31/2018  . OSA on CPAP    havent used CPAP in long time per patient due to unable to get filters  to change out  . Osteopenia 01/2017   T score -1.4 FRAX 3.4% / 0.4%  . Potassium (K) deficiency    no longer taking medication for potassium MD no longer prescribed  . Tachycardia   . Vitamin D deficiency    Past Surgical History:  Procedure Laterality Date  . BIOPSY BREAST  1970   Right Breast  . COLONOSCOPY    . DILATATION & CURETTAGE/HYSTEROSCOPY WITH MYOSURE N/A 03/06/2017   Procedure: DILATATION & CURETTAGE/HYSTEROSCOPY WITH MYOSURE;  Surgeon: [name], MD;  Location: [place];  Service: Gynecology;  Laterality: N/A;  Request 7:30am in  [facility] block time  requests one hour     Current Meds  Medication Sig  . aspirin EC 81 MG tablet Take 81 mg by mouth as directed. 1 tablet daily alternating with 2 daily  . atenolol (TENORMIN) 25 MG tablet Take 25 mg by mouth daily.  . Cholecalciferol (VITAMIN D PO) Take 2,000 Units by mouth.   . hydrochlorothiazide (HYDRODIURIL) 25 MG tablet Take 25 mg by mouth daily.  Marland Kitchen losartan (COZAAR) 100 MG tablet Take 100 mg by mouth daily.     Allergies:   Amlodipine; Chocolate; Coconut fatty acids; Other; and Peanut-containing drug products   Social History   Tobacco Use  . Smoking status: Former Smoker    Packs/day: 1.00    Years: 5.00    Pack years: 5.00    Types: Cigarettes    Last attempt to quit: 04/22/1979    Years since quitting: 39.4  . Smokeless tobacco: Never Used  Substance Use Topics  . Alcohol use: No    Alcohol/week: 0.0 standard drinks  . Drug use: No     Family Hx: The patient's family history includes Heart attack in her father; Hypertension in her mother; Stroke in her father.  ROS:   Please see the history of present illness.     General: Negative; No fevers, chills, or night sweats;  HEENT: Negative; No changes in vision or hearing, sinus congestion, difficulty swallowing Pulmonary: Negative; No cough, wheezing, shortness of breath, hemoptysis Cardiovascular: Negative; No chest pain, presyncope, syncope, palpitations GI: Negative; No nausea, vomiting, diarrhea, or abdominal pain GU: Negative; No dysuria, hematuria, or difficulty voiding Musculoskeletal: Negative; no myalgias, joint pain, or weakness Hematologic/Oncology: Negative; no easy bruising, bleeding Endocrine: Negative; no heat/cold intolerance; no diabetes Neuro: Negative; no changes in balance, headaches Skin: Negative; No rashes or skin lesions Psychiatric: Negative; No behavioral problems, depression Sleep: positive for OSA with previous snoring, daytime sleepiness, hypersomnolence, no  bruxism, restless legs, hypnogognic hallucinations, no cataplexy Other comprehensive 14 point system review is negative. All other systems reviewed and are negative.   Prior CV studies:   The following studies were reviewed today:  CPAP Titration 06/20/2018 CLINICAL INFORMATION The patient is referred for a CPAP titration to treat sleep apnea.  Date of NPSG: 05/17/2018:  AHI 22.4/h; RDI 26.8/h; REM AHI 49.8/h; O2 nadir 78%.  SLEEP STUDY TECHNIQUE As per the AASM Manual for the Scoring of Sleep and Associated Events v2.3 (April 2016) with a hypopnea requiring 4% desaturations.  The channels recorded and monitored were frontal, central and occipital EEG, electrooculogram (EOG), submentalis EMG (chin), nasal and oral airflow, thoracic and abdominal wall motion, anterior tibialis EMG, snore microphone, electrocardiogram, and pulse oximetry. Continuous positive airway pressure (CPAP) was initiated at the beginning of the study and titrated to treat sleep-disordered breathing.  MEDICATIONS     aspirin 325 MG tablet  atenolol (TENORMIN) 25 MG tablet         Cholecalciferol (VITAMIN D PO)         hydrochlorothiazide (HYDRODIURIL) 25 MG tablet         losartan (COZAAR) 100 MG tablet      Medications self-administered by patient taken the night of the study : N/A  TECHNICIAN COMMENTS Comments added by technician: Patient had difficulty maintaining sleep Comments added by scorer: N/A  RESPIRATORY PARAMETERS Optimal PAP Pressure (cm):  10        AHI at Optimal Pressure (/hr):            0.0 Overall Minimal O2 (%):         88.0     Supine % at Optimal Pressure (%):    100 Minimal O2 at Optimal Pressure (%): 95.0       SLEEP ARCHITECTURE The study was initiated at 10:00:05 PM and ended at 4:58:45 AM.  Sleep onset time was 21.3 minutes and the sleep efficiency was 61.1%%. The total sleep time was 255.8 minutes.  The patient spent 4.5%% of the night in stage N1 sleep,  66.6%% in stage N2 sleep, 0.0%% in stage N3 and 28.9% in REM.Stage REM latency was 104.5 minutes  Wake after sleep onset was 141.5. Alpha intrusion was absent. Supine sleep was 100.00%.  CARDIAC DATA The 2 lead EKG demonstrated sinus rhythm. The mean heart rate was 63.1 beats per minute. Other EKG findings include: PVCs.  LEG MOVEMENT DATA The total Periodic Limb Movements of Sleep (PLMS) were 0. The PLMS index was 0.0. A PLMS index of <15 is considered normal in adults.  IMPRESSIONS - CPAP was initiated at 5 cm and was titrated to PAP pressure of 10 cm of water; AHI at 10 cm 0; O2 nadir 95%. - Central sleep apnea was not noted during this titration (CAI = 0.9/h). - Mild oxygen desaturations to a nadir of 885 at 8 cm.  - The patient snored with moderate snoring volume during this titration study. - 2-lead EKG demonstrated: PVCs - Clinically significant periodic limb movements were not noted during this study. Arousals associated with PLMs were rare.  DIAGNOSIS - Obstructive Sleep Apnea (327.23 [G47.33 ICD-10])  RECOMMENDATIONS - Recommend an initial trial of CPAP therapy with EPR at 10 cm H2O with heated humidification.  A small size Resmed Nasal Pillow Mask AirFit P30 mask was used for the titration.  - Effort should be made to optimize nasal and oropharyngeal patency.  - Avoid alcohol, sedatives and other CNS depressants that may worsen sleep apnea and disrupt normal sleep architecture. - Sleep hygiene should be reviewed to assess factors that may improve sleep quality. - Weight management and regular exercise should be initiated or continued. - Recommend a download in 30 days and sleep clinic evaluation after 4 weeks of therapy.   [Electronically signed] 06/26/2018 10:53 AM  Labs/Other Tests and Data Reviewed:    EKG:  An ECG dated 05/18/2018 was personally reviewed today and demonstrated:  Sinus bradycardia at 56 bpm  Recent Labs: No results found for requested labs  within last 8760 hours.   Recent Lipid Panel Lab Results  Component Value Date/Time   CHOL  10/28/2009 04:55 AM    153        ATP III CLASSIFICATION:  <200     mg/dL   Desirable  200-239  mg/dL   Borderline High  >=240    mg/dL   High  TRIG 53 10/28/2009 04:55 AM   HDL 60 10/28/2009 04:55 AM   CHOLHDL 2.6 10/28/2009 04:55 AM   LDLCALC  10/28/2009 04:55 AM    82        Total Cholesterol/HDL:CHD Risk Coronary Heart Disease Risk Table                     Men   Women  1/2 Average Risk   3.4   3.3  Average Risk       5.0   4.4  2 X Average Risk   9.6   7.1  3 X Average Risk  23.4   11.0        Use the calculated Patient Ratio above and the CHD Risk Table to determine the patient's CHD Risk.        ATP III CLASSIFICATION (LDL):  <100     mg/dL   Optimal  100-129  mg/dL   Near or Above                    Optimal  130-159  mg/dL   Borderline  160-189  mg/dL   High  >190     mg/dL   Very High   LDLDIRECT 121.6 10/13/2006 05:29 PM    Wt Readings from Last 3 Encounters:  09/23/18 185 lb (83.9 kg)  06/20/18 183 lb (83 kg)  05/18/18 183 lb (83 kg)     Objective:    Vital Signs:  BP 128/77   Pulse (!) 57   Ht 5\' 3"  (1.6 m)   Wt 185 lb (83.9 kg)   BMI 32.77 kg/m    Since this was a phone evaluation I could not physically examine the patient. Vital signs are stable Breathing is normal with unlabored respirations There is no audible wheezing She denies any swelling or neck vein distention There is no chest discomfort to palpation or abdominal discomfort. She denies any edema She denies neurologic issues She has normal cognition, mood and affect    ASSESSMENT & PLAN:    1. OSA: By history, Ms. Mizner states that she was first diagnosed with obstructive sleep apnea approximately 10 years ago when she had a sleep study done in [place].  She had used CPAP for some time but ultimately never had a visit with a sleep physician and when her DME company went out  of business she stopped treatment.  Recently she had noticed snoring, nonrestorative sleep, frequent awakenings and daytime sleepiness.  I thoroughly reviewed sleep evaluations with her diagnostic polysomnogram as well as CPAP titration trial in detail.  I thoroughly reviewed with her normal sleep architecture and the effects of untreated sleep apnea on sleep architecture as well as cardiovascular risk including hypertension, nocturnal arrhythmias, atrial fibrillation, potential ischemic and increased inflammatory effects, in addition to effects on glucose tolerance as well as GERD.  She has been on CPAP therapy since late April.  She is meeting Medicare compliance standards.  The 3 days that she did not use therapy was attributed to the fact that she had washed her tubing and it was still significantly wet.  Her download today at 10 cm indicates she still is having residual mild sleep apnea with an AHI of 10.9.  At times there has been some intermittent mask leak.  I am changing her mode from a CPAP at 10 cm to a CPAP auto mode with a range initiating at 10 cm but up to 20 cm water pressure.  I am changing her ramp to an auto ramp with this dart ramp pressure at 6 cm rather than 4.  A download will be obtained in 4 to 6 weeks to reassess improvement and adjustments will be made if necessary.  She is meeting compliance standards and I will see her in 1 year for mandated follow-up evaluation but will be available sooner if needed. 2. Hypertension: Blood pressure today is stable on her medical regimen consisting of atenolol, losartan in addition to hydrochlorothiazide. 3. Mild obesity: BMI 32.  We discussed the importance of weight loss and exercise at least 5 days/week for 30 minutes duration of moderate intensity.  COVID-19 Education: The signs and symptoms of COVID-19 were discussed with the patient and how to seek care for testing (follow up with PCP or arrange E-visit).  The importance of social distancing  was discussed today.  Time:   Today, I have spent 32minutes with the patient with telehealth technology discussing the above problems.     Medication Adjustments/Labs and Tests Ordered: Current medicines are reviewed at length with the patient today.  Concerns regarding medicines are outlined above.   Tests Ordered: No orders of the defined types were placed in this encounter.   Medication Changes: No orders of the defined types were placed in this encounter.   Disposition:  Follow up 1 year or sooner if issues arise  Signed, Shelva Majestic, MD  09/23/2018 9:35 AM    [facility]

## 2018-09-23 NOTE — Patient Instructions (Signed)
Medication Instructions:  The current medical regimen is effective;  continue present plan and medications.  If you need a refill on your cardiac medications before your next appointment, please call your pharmacy.   Follow-Up: At [facility], you and your health needs are our priority.  As part of our continuing mission to provide you with exceptional heart care, we have created designated Provider Care Teams.  These Care Teams include your primary Cardiologist (physician) and Advanced Practice Providers (APPs -  Physician Assistants and Nurse Practitioners) who all work together to provide you with the care you need, when you need it. You will need a follow up appointment in 12 months.  Please call our office 2 months in advance to schedule this appointment.  You may see Dr.Kelly (sleep) or one of the following Advanced Practice Providers on your designated Care Team:   Kerin Ransom, PA-C Roby Lofts, [place] . Sande Rives, PA-C

## 2018-11-18 ENCOUNTER — Other Ambulatory Visit: Payer: Self-pay

## 2019-01-26 ENCOUNTER — Encounter: Payer: Self-pay | Admitting: Gynecology

## 2019-02-02 DIAGNOSIS — I10 Essential (primary) hypertension: Secondary | ICD-10-CM | POA: Diagnosis not present

## 2019-02-02 DIAGNOSIS — G4733 Obstructive sleep apnea (adult) (pediatric): Secondary | ICD-10-CM | POA: Diagnosis not present

## 2019-02-02 DIAGNOSIS — E669 Obesity, unspecified: Secondary | ICD-10-CM | POA: Diagnosis not present

## 2019-04-26 DIAGNOSIS — R079 Chest pain, unspecified: Secondary | ICD-10-CM | POA: Diagnosis not present

## 2019-04-26 DIAGNOSIS — E7800 Pure hypercholesterolemia, unspecified: Secondary | ICD-10-CM | POA: Diagnosis not present

## 2019-04-26 DIAGNOSIS — I671 Cerebral aneurysm, nonruptured: Secondary | ICD-10-CM | POA: Diagnosis not present

## 2019-04-26 DIAGNOSIS — I10 Essential (primary) hypertension: Secondary | ICD-10-CM | POA: Diagnosis not present

## 2019-05-04 ENCOUNTER — Other Ambulatory Visit: Payer: Self-pay

## 2019-05-04 ENCOUNTER — Ambulatory Visit (INDEPENDENT_AMBULATORY_CARE_PROVIDER_SITE_OTHER): Payer: Medicare Other | Admitting: Cardiovascular Disease

## 2019-05-04 VITALS — BP 118/72 | HR 57 | Ht 63.0 in | Wt 187.3 lb

## 2019-05-04 DIAGNOSIS — Z01812 Encounter for preprocedural laboratory examination: Secondary | ICD-10-CM | POA: Diagnosis not present

## 2019-05-04 DIAGNOSIS — G4733 Obstructive sleep apnea (adult) (pediatric): Secondary | ICD-10-CM | POA: Diagnosis not present

## 2019-05-04 DIAGNOSIS — Z8249 Family history of ischemic heart disease and other diseases of the circulatory system: Secondary | ICD-10-CM | POA: Diagnosis not present

## 2019-05-04 DIAGNOSIS — I10 Essential (primary) hypertension: Secondary | ICD-10-CM

## 2019-05-04 DIAGNOSIS — R072 Precordial pain: Secondary | ICD-10-CM | POA: Diagnosis not present

## 2019-05-04 DIAGNOSIS — R079 Chest pain, unspecified: Secondary | ICD-10-CM

## 2019-05-04 NOTE — Progress Notes (Signed)
Cardiology Office Note   Date:  05/22/2019   ID:  Christine Steele 09-24-1950, MRN [id number]  PCP:  [name], MD  Cardiologist:   Skeet Latch, MD   No chief complaint on file.    History of Present Illness: Christine Steele is a 69 y.o. female with hypertension, brain aneurysm, and OSA on CPAP here for follow up.  She was initially seen 03/2018 for chest tightness.  Christine Steele reported episodes of chest discomfort associated with L arm weakness.  In general she has been feeling weak and tired.  The chest discomfort comes and goes.  She first noticed it after she switched from bisoprolol to atenolol.  The pain is up to 8/10 in severity and is not associated with shortness of breath, nausea or diaphoresis.  She was referred for a Lexiscan Myoview 03/2018 that was negative for ischemia.  She also has a diagnosis of OSA but hasn't worn her CPAP machine in years.  Her machine broke after less than one year and it was never replaced.  She was referred for a sleep study which she had last night.  Christine Steele has been feeling well.  Since her last appointment she had one episode of chest tightness.  She doesn't recall what she was doing at the time.  She has been feeling generally well.  Her main complaint is plantar fasciitis, which is improving.  She hopes to start exercising soon.  He has no shortness of breath, lower extremity edema, orthopnea or PND.   Since her last appointment she has been experiencing spells of feeling weak and then radiating into her chest and left side.  At times it occurs at night when lying in bed and keeps her up from sleeping.  Sometimes it starts on her right side and radiates down the right side of her body.  These episodes occurred up to twice per week. However it is sporadic and she hasn't had any in several weeks.  She doesn't think it is exertional, though she doesn't get much exercise.  She notices that it first occurred when she was stressed and anxious and  overworking herself.  Her PCP stopped HCTZ to see if that helped.  She is unsure whether this helped.  Her BP became elevated.  Losartan was switched to irbesartan.  She has also stopped eating processed food and limited salt intake.     Past Medical History:  Diagnosis Date  . Aneurysm (HCC)    56mm Right MCA  . Atypical chest pain 03/31/2018  . Cataract    Bilateral  . Chronic kidney disease    Dr. Shelia Media (PCP)   . Hypertension   . OSA (obstructive sleep apnea) 03/31/2018  . OSA on CPAP    havent used CPAP in long time per patient due to unable to get filters to change out  . Osteopenia 01/2017   T score -1.4 FRAX 3.4% / 0.4%  . Potassium (K) deficiency    no longer taking medication for potassium MD no longer prescribed  . Tachycardia   . Vitamin D deficiency     Past Surgical History:  Procedure Laterality Date  . BIOPSY BREAST  1970   Right Breast  . COLONOSCOPY    . DILATATION & CURETTAGE/HYSTEROSCOPY WITH MYOSURE N/A 03/06/2017   Procedure: DILATATION & CURETTAGE/HYSTEROSCOPY WITH MYOSURE;  Surgeon: [name], MD;  Location: [facility];  Service: Gynecology;  Laterality: N/A;  Request 7:30am in [facility] block time  requests one hour  . right lumpectomy     benign     Current Outpatient Medications  Medication Sig Dispense Refill  . Ascorbic Acid (VITAMIN C PO) Take by mouth daily.    . irbesartan (AVAPRO) 300 MG tablet Take 300 mg by mouth daily.    . Multiple Vitamin (MULTIVITAMIN WITH MINERALS) TABS tablet Take 1 tablet by mouth daily.    . rosuvastatin (CRESTOR) 5 MG tablet Take 5 mg by mouth daily.    Marland Kitchen aspirin EC 325 MG tablet Take 325 mg by mouth every other day.    Marland Kitchen atenolol (TENORMIN) 25 MG tablet Take 25 mg by mouth daily.     . Cholecalciferol (VITAMIN D PO) Take 1,000 Units by mouth.     . hydrochlorothiazide (HYDRODIURIL) 25 MG tablet Take 25 mg by mouth daily. With a glass of OJ or a banana    . Turmeric 500 MG CAPS  Take by mouth. occasionally     No current facility-administered medications for this visit.    Allergies:   Amlodipine, Chocolate, Coconut fatty acids, Other, and Peanut-containing drug products    Social History:  The patient  reports that she quit smoking about 40 years ago. Her smoking use included cigarettes. She has a 5.00 pack-year smoking history. She has never used smokeless tobacco. She reports that she does not drink alcohol or use drugs.   Family History:  The patient's family history includes Heart attack in her father; Hypertension in her mother; Stroke in her father.    ROS:  Please see the history of present illness.   Otherwise, review of systems are positive for none.   All other systems are reviewed and negative.    PHYSICAL EXAM: VS:  BP 118/72   Pulse (!) 57   Ht 5\' 3"  (1.6 m)   Wt 187 lb 4.8 oz (85 kg)   BMI 33.18 kg/m  , BMI Body mass index is 33.18 kg/m. GENERAL:  Well appearing HEENT: Pupils equal round and reactive, fundi not visualized, oral mucosa unremarkable NECK:  No jugular venous distention, waveform within normal limits, carotid upstroke brisk and symmetric, no bruits LUNGS:  Clear to auscultation bilaterally HEART:  RRR.  PMI not displaced or sustained,S1 and S2 within normal limits, no S3, no S4, no clicks, no rubs, no murmurs ABD:  Flat, positive bowel sounds normal in frequency in pitch, no bruits, no rebound, no guarding, no midline pulsatile mass, no hepatomegaly, no splenomegaly EXT:  2 plus pulses throughout, no edema, no cyanosis no clubbing SKIN:  No rashes no nodules NEURO:  Cranial nerves II through XII grossly intact, motor grossly intact throughout PSYCH:  Cognitively intact, oriented to person place and time   EKG:  EKG is ordered today. 05/04/19: Sinus bradycardia.  Rate 57 bpm.   Lexiscan Myoview  04/02/18:  Nuclear stress EF: 52%. The left ventricular ejection fraction is mildly decreased (45-54%).  T wave inversion was  noted during stress in the V4, V5, aVF and V3 leads.  This is a low risk study. No evidence of ischemia. No previous MI  The study is normal.    Recent Labs: No results found for requested labs within last 8760 hours.    Lipid Panel    Component Value Date/Time   CHOL  10/28/2009 0455    153        ATP III CLASSIFICATION:  <200     mg/dL   Desirable  200-239  mg/dL   Borderline  High  >=240    mg/dL   High          TRIG 53 10/28/2009 0455   HDL 60 10/28/2009 0455   CHOLHDL 2.6 10/28/2009 0455   VLDL 11 10/28/2009 0455   LDLCALC  10/28/2009 0455    82        Total Cholesterol/HDL:CHD Risk Coronary Heart Disease Risk Table                     Men   Women  1/2 Average Risk   3.4   3.3  Average Risk       5.0   4.4  2 X Average Risk   9.6   7.1  3 X Average Risk  23.4   11.0        Use the calculated Patient Ratio above and the CHD Risk Table to determine the patient's CHD Risk.        ATP III CLASSIFICATION (LDL):  <100     mg/dL   Optimal  100-129  mg/dL   Near or Above                    Optimal  130-159  mg/dL   Borderline  160-189  mg/dL   High  >190     mg/dL   Very High   LDLDIRECT 121.6 10/13/2006 1729      Wt Readings from Last 3 Encounters:  05/04/19 187 lb 4.8 oz (85 kg)  09/23/18 185 lb (83.9 kg)  06/20/18 183 lb (83 kg)      ASSESSMENT AND PLAN:  # Atypical chest pain:  Lexiscan Myoview was negative for ischemia. However she continues to have atypical symptoms. We will get a coronary CT-A to ensure that there is no obstructive CAD.  # OSA: Encouraged continued CPAP usage.  # Hypertension: BP controlled on atenolol, HCTZ and irbesartan.  # Hyperlipidemia: She was appropriately started on rosuvasatin.    # Brain aneurysm: On aspirin 325mg  per neurology.    Current medicines are reviewed at length with the patient today.  The patient does not have concerns regarding medicines.  The following changes have been made:  no change  Labs/  tests ordered today include:   Orders Placed This Encounter  Procedures  . CT CORONARY MORPH W/CTA COR W/SCORE W/CA W/CM &/OR WO/CM  . CT CORONARY FRACTIONAL FLOW RESERVE DATA PREP  . CT CORONARY FRACTIONAL FLOW RESERVE FLUID ANALYSIS  . Basic metabolic panel  . EKG 12-Lead     Disposition:   FU with Aeneas Longsworth C. Oval Linsey, MD, [facility] in 1 year   Signed, Kaycie Pegues C. Oval Linsey, MD, [facility]  05/22/2019 8:19 PM    [place]

## 2019-05-04 NOTE — Patient Instructions (Addendum)
Medication Instructions:  TAKE WHOLE ATENOLOL MORNING OF CARDIAC CT  *If you need a refill on your cardiac medications before your next appointment, please call your pharmacy*  Lab Work: BMET 1 WEEK PRIOR TO CT  If you have labs (blood work) drawn today and your tests are completely normal, you will receive your results only by: Marland Kitchen MyChart Message (if you have MyChart) OR . A paper copy in the mail If you have any lab test that is abnormal or we need to change your treatment, we will call you to review the results.  Testing/Procedures: Your physician has requested that you have cardiac CT. Cardiac computed tomography (CT) is a painless test that uses an x-ray machine to take clear, detailed pictures of your heart. For further information please visit [email or web address]. Please follow instruction sheet as given. THE OFFICE WILL CALL TO SCHEDULE ONCE YOUR INSURANCE HAS APPROVED  Follow-Up: At [facility], you and your health needs are our priority.  As part of our continuing mission to provide you with exceptional heart care, we have created designated Provider Care Teams.  These Care Teams include your primary Cardiologist (physician) and Advanced Practice Providers (APPs -  Physician Assistants and Nurse Practitioners) who all work together to provide you with the care you need, when you need it.  Your next appointment:   12 month(s) You will receive a reminder letter in the mail two months in advance. If you don't receive a letter, please call our office to schedule the follow-up appointment.   The format for your next appointment:   Either In Person or Virtual  Provider:   You may see DR [facility] or one of the following Advanced Practice Providers on your designated Care Team:    Kerin Ransom, PA-C  [place], [place]  Coletta Memos, [place]   Other Instructions   Your cardiac CT will be scheduled at one of the below locations:   [facility] [address] [place], [place] [address] [phone]  [facility] [address] [place], [place] [address] [phone]  If scheduled at [facility], please arrive at the [facility] main entrance of [facility] 30-45 minutes prior to test start time. Proceed to the [facility] Radiology Department (first floor) to check-in and test prep.  If scheduled at [facility], please arrive 15 mins early for check-in and test prep.  Please follow these instructions carefully (unless otherwise directed):  On the Night Before the Test: . Be sure to Drink plenty of water. . Do not consume any caffeinated/decaffeinated beverages or chocolate 12 hours prior to your test. . Do not take any antihistamines 12 hours prior to your test. . If the patient has contrast allergy: ? Patient will need a prescription for Prednisone and very clear instructions (as follows): 1. Prednisone 50 mg - take 13 hours prior to test 2. Take another Prednisone 50 mg 7 hours prior to test 3. Take another Prednisone 50 mg 1 hour prior to test 4. Take Benadryl 50 mg 1 hour prior to test . Patient must complete all four doses of above prophylactic medications. . Patient will need a ride after test due to Benadryl.  On the Day of the Test: . Drink plenty of water. Do not drink any water within one hour of the test. . Do not eat any food 4 hours prior to the test. . You may take your regular medications prior  to the test.  . Take full Atenolol prior to test. . HOLD Furosemide/Hydrochlorothiazide morning of the test. . FEMALES- please wear underwire-free bra if available       After the Test: . Drink plenty of water. . After receiving IV contrast, you may experience a mild flushed feeling. This is normal. . On occasion, you may experience a mild rash up to 24 hours after the test. This is not dangerous. If this occurs, you can take Benadryl  25 mg and increase your fluid intake. . If you experience trouble breathing, this can be serious. If it is severe call 911 IMMEDIATELY. If it is mild, please call our office. . If you take any of these medications: Glipizide/Metformin, Avandament, Glucavance, please do not take 48 hours after completing test unless otherwise instructed.  Once we have confirmed authorization from your insurance company, we will call you to set up a date and time for your test.   For non-scheduling related questions, please contact the cardiac imaging nurse navigator should you have any questions/concerns: Marchia Bond, RN Navigator Cardiac Imaging [facility] [phone] Office    Cardiac CT Angiogram A cardiac CT angiogram is a procedure to look at the heart and the area around the heart. It may be done to help find the cause of chest pains or other symptoms of heart disease. During this procedure, a substance called contrast dye is injected into the blood vessels in the area to be checked. A large X-ray machine, called a CT scanner, then takes detailed pictures of the heart and the surrounding area. The procedure is also sometimes called a coronary CT angiogram, coronary artery scanning, or CTA. A cardiac CT angiogram allows the health care provider to see how well blood is flowing to and from the heart. The health care provider will be able to see if there are any problems, such as:  Blockage or narrowing of the coronary arteries in the heart.  Fluid around the heart.  Signs of weakness or disease in the muscles, valves, and tissues of the heart. Tell a health care provider about:  Any allergies you have. This is especially important if you have had a previous allergic reaction to contrast dye.  All medicines you are taking, including vitamins, herbs, eye drops, creams, and over-the-counter medicines.  Any blood disorders you have.  Any surgeries you have had.  Any medical  conditions you have.  Whether you are pregnant or may be pregnant.  Any anxiety disorders, chronic pain, or other conditions you have that may increase your stress or prevent you from lying still. What are the risks? Generally, this is a safe procedure. However, problems may occur, including:  Bleeding.  Infection.  Allergic reactions to medicines or dyes.  Damage to other structures or organs.  Kidney damage from the contrast dye that is used.  Increased risk of cancer from radiation exposure. This risk is low. Talk with your health care provider about: ? The risks and benefits of testing. ? How you can receive the lowest dose of radiation. What happens before the procedure?  Wear comfortable clothing and remove any jewelry, glasses, dentures, and hearing aids.  Follow instructions from your health care provider about eating and drinking. This may include: ? For 12 hours before the procedure -- avoid caffeine. This includes tea, coffee, soda, energy drinks, and diet pills. Drink plenty of water or other fluids that do not have caffeine in them. Being well hydrated can prevent  complications. ? For 4-6 hours before the procedure -- stop eating and drinking. The contrast dye can cause nausea, but this is less likely if your stomach is empty.  Ask your health care provider about changing or stopping your regular medicines. This is especially important if you are taking diabetes medicines, blood thinners, or medicines to treat problems with erections (erectile dysfunction). What happens during the procedure?   Hair on your chest may need to be removed so that small sticky patches called electrodes can be placed on your chest. These will transmit information that helps to monitor your heart during the procedure.  An IV will be inserted into one of your veins.  You might be given a medicine to control your heart rate during the procedure. This will help to ensure that good images are  obtained.  You will be asked to lie on an exam table. This table will slide in and out of the CT machine during the procedure.  Contrast dye will be injected into the IV. You might feel warm, or you may get a metallic taste in your mouth.  You will be given a medicine called nitroglycerin. This will relax or dilate the arteries in your heart.  The table that you are lying on will move into the CT machine tunnel for the scan.  The person running the machine will give you instructions while the scans are being done. You may be asked to: ? Keep your arms above your head. ? Hold your breath. ? Stay very still, even if the table is moving.  When the scanning is complete, you will be moved out of the machine.  The IV will be removed. The procedure may vary among health care providers and hospitals. What can I expect after the procedure? After your procedure, it is common to have:  A metallic taste in your mouth from the contrast dye.  A feeling of warmth.  A headache from the nitroglycerin. Follow these instructions at home:  Take over-the-counter and prescription medicines only as told by your health care provider.  If you are told, drink enough fluid to keep your urine pale yellow. This will help to flush the contrast dye out of your body.  Most people can return to their normal activities right after the procedure. Ask your health care provider what activities are safe for you.  It is up to you to get the results of your procedure. Ask your health care provider, or the department that is doing the procedure, when your results will be ready.  Keep all follow-up visits as told by your health care provider. This is important. Contact a health care provider if:  You have any symptoms of allergy to the contrast dye. These include: ? Shortness of breath. ? Rash or hives. ? A racing heartbeat. Summary  A cardiac CT angiogram is a procedure to look at the heart and the area around  the heart. It may be done to help find the cause of chest pains or other symptoms of heart disease.  During this procedure, a large X-ray machine, called a CT scanner, takes detailed pictures of the heart and the surrounding area after a contrast dye has been injected into blood vessels in the area.  Ask your health care provider about changing or stopping your regular medicines before the procedure. This is especially important if you are taking diabetes medicines, blood thinners, or medicines to treat erectile dysfunction.  If you are told, drink enough fluid to  keep your urine pale yellow. This will help to flush the contrast dye out of your body. This information is not intended to replace advice given to you by your health care provider. Make sure you discuss any questions you have with your health care provider. Document Revised: 12/01/2018 Document Reviewed: 12/01/2018 Elsevier Patient Education  [place].

## 2019-05-12 DIAGNOSIS — G459 Transient cerebral ischemic attack, unspecified: Secondary | ICD-10-CM | POA: Diagnosis not present

## 2019-05-12 DIAGNOSIS — I10 Essential (primary) hypertension: Secondary | ICD-10-CM | POA: Diagnosis not present

## 2019-05-12 DIAGNOSIS — R200 Anesthesia of skin: Secondary | ICD-10-CM | POA: Diagnosis not present

## 2019-05-12 DIAGNOSIS — I671 Cerebral aneurysm, nonruptured: Secondary | ICD-10-CM | POA: Diagnosis not present

## 2019-05-19 ENCOUNTER — Encounter: Payer: Self-pay | Admitting: *Deleted

## 2019-05-22 ENCOUNTER — Encounter: Payer: Self-pay | Admitting: Cardiovascular Disease

## 2019-05-23 ENCOUNTER — Encounter: Payer: Self-pay | Admitting: Neurology

## 2019-05-23 ENCOUNTER — Ambulatory Visit (INDEPENDENT_AMBULATORY_CARE_PROVIDER_SITE_OTHER): Payer: Medicare Other | Admitting: Neurology

## 2019-05-23 ENCOUNTER — Other Ambulatory Visit: Payer: Self-pay

## 2019-05-23 VITALS — BP 124/78 | HR 67 | Temp 97.2°F | Ht 63.0 in | Wt 182.5 lb

## 2019-05-23 DIAGNOSIS — I671 Cerebral aneurysm, nonruptured: Secondary | ICD-10-CM | POA: Diagnosis not present

## 2019-05-23 NOTE — Patient Instructions (Signed)
Repeat CT to look at blood vessels, should follow yearly  Cerebral Aneurysm  A cerebral aneurysm is a bulge that occurs in the blood vessel inside the brain. An aneurysm is caused when a weakened part of the blood vessel expands. The blood vessel expands due to the constant pressure from the flow of blood through the weakened blood vessel. As the weakened aneurysm expands, the walls of the aneurysm become weaker. Aneurysms are dangerous because they can leak or burst (rupture). When a cerebral aneurysm ruptures, it causes bleeding in the brain (subarachnoid hemorrhage). The blood flow to the area of the brain supplied by the artery is also reduced. This can cause stroke, seizures, or coma. A ruptured cerebral aneurysm is a medical emergency. This can cause permanent brain damage or death. What are the causes? The exact cause of this condition is not known. What increases the risk? This condition is more likely to develop in people who:  Are older. The condition is most common in people between the ages of 9-60.  Are female  Have a family history of aneurysm in two or more direct relatives.  Have certain conditions that are passed along from parent to child (inherited). They include: ? Autosomal dominant polycystic kidney disease. This is a condition in which small, fluid-filled sacs (cysts) develop in the kidney. ? Neurofibromatosis type 1. In this condition, flat spots develop under the skin (pigmentation) and tumors grow along nerves in the skin, brain, and other parts of the body. ? Ehlers-Danlos syndrome. This is a condition in which bad connective tissue causes loose or unstable joints and creates a very soft skin that bruises or tears easily.  Smoke.  Have high blood pressure (hypertension).  Abuse alcohol. What are the signs or symptoms? The signs and symptoms of a cerebral aneurysm that has not leaked or ruptured can depend on its size and rate of growth. A small, unchanging  aneurysm generally does not cause symptoms. A larger aneurysm that is steadily growing can increase pressure on the brain or nerves.  The increased pressure from a cerebral aneurysm that has not leaked or ruptured can cause:  A headache.  Vision problems.  Numbness or weakness in an arm or leg.  Memory problems.  Problems speaking.  Seizures. If an aneurysm leaks or ruptures, it can cause a life-threatening condition, such as stroke. Symptoms may include:  A sudden, severe headache with no known cause. The headache is often described as the worst headache ever experienced.  Stiff neck.  Nausea or vomiting, especially when combined with other symptoms, such as a headache.  Sudden weakness or numbness of the face, arm, or leg, especially on one side of the body.  Sudden trouble walking or difficulty moving the arms or legs.  Double vision.  Sudden trouble seeing in one or both eyes.  Trouble speaking or understanding speech (aphasia).  Trouble swallowing.  Dizziness.  Loss of balance or coordination.  Intolerance to light.  Sudden confusion or loss of consciousness. How is this diagnosed? This condition is diagnosed using certain tests, including:  CT scan.  Computed tomographic angiogram (CTA). This test uses a dye and a scanner to produce images of your blood vessels.  Magnetic resonance angiogram (MRA). This test uses an MRI machine to produce images of your blood vessels.  Digital subtraction angiogram (DSA). This test involves placing a flexible, thin tube (catheter) into the artery in your thigh and guiding it up to the arteries in the brain. A dye is  then injected into the area and X-rays are taken to create images of your blood vessels. How is this treated? Unruptured aneurysm Treatment is complex when an aneurysm is found and it is not causing problems. Treatment is individualized, as each case is different. Many factors must be considered, such as the size  and exact location of your aneurysm, your age, your overall health, and your preferences. Small aneurysms in certain locations of the brain have a very low chance of bleeding or rupturing. These small aneurysms may not be treated.  In some cases, however, treatment may be required. Treatment depends on the size and location of the aneurysm. They may include:  Coiling. During this procedure, a catheter is inserted and advanced through a blood vessel. Once the catheter reaches the aneurysm, tiny coils are used to block blood flow into the aneurysm. This procedure is sometimes done at the same time as a DSA.  Surgical clipping. During surgery, a clip is placed at the base of the aneurysm. The clip prevents blood from continuing to enter the aneurysm.  Flow diversion. This procedure is used to divert blood flow around the aneurysm with a stent that is placed across the opening of an aneurysm. Ruptured aneurysm Immediate emergency surgery or coiling may be needed to help prevent damage to the brain and to reduce the risk of rebleeding. The timing of treatment is an important factor in preventing complications. Successful early treatment of a ruptured aneurysm within the first 3 days of a bleed helps to prevent rebleeding and blood vessel spasm. In some cases, there may be a reason to treat 10-14 days after a rupture. Many factors are considered when making this decision, and each case is handled individually. Follow these instructions at home: If your aneurysm is not treated:  Take over-the-counter and prescription medicines only as told by your health care provider.  Follow a diet suggested by your health care provider. Certain dietary changes may be advised to address high blood pressure (hypertension), such as choosing foods that are low in salt (sodium), saturated fat, trans fat, and cholesterol.  Stay physically active. It is recommended that you get at least 30 minutes of activity on most or all  days.  Do not use any products that contain nicotine or tobacco, such as cigarettes and e-cigarettes. If you need help quitting, ask your health care provider.  Limit alcohol intake to no more than 1 drink a day for nonpregnant women and 2 drinks a day for men. One drink equals 12 oz of beer, 5 oz of wine, or 1 oz of hard liquor.  Do not use street drugs. If you need help quitting, ask your health care provider.  Keep all follow-up visits as told by your health care provider. This is important. This includes any referrals, imaging studies, and laboratory tests. Proper follow-up may prevent an aneurysm rupture or a stroke. Get help right away if:  You have a sudden, severe headache with no known cause. This may include a stiff neck.  You have sudden nausea or vomiting with a severe headache.  You have a seizure.  You have other symptoms of stroke. The acronym BEFAST is an easy way to remember the main warning signs of stroke. ? B = Balance problems. Signs include dizziness, sudden trouble walking, or loss of balance. ? E = Eye problems. This includes trouble seeing or a sudden change in vision. ? F = Face changes. This includes sudden weakness or numbness of the face,  or the face or eyelid drooping to one side. ? A = Arm weakness or numbness. This happens suddenly and usually on one side of the body. ? S = Speech problems. This includes trouble speaking or trouble understanding. ? T = Time. Time to call 911 or seek emergency care. Do not wait to see if symptoms will go away. Make note of the time your symptoms started. These symptoms may represent a serious problem that is an emergency. Do not wait to see if the symptoms will go away. Get medical help right away. Call your local emergency services (911 in the [place] Do not drive yourself to the hospital. Summary  An aneurysm is a bulge in an artery.  Aneurysms are dangerous because they can leak or burst (rupture). When a cerebral  aneurysm ruptures, it causes bleeding in the brain.  Treatment depends on whether the aneurysm is ruptured. A ruptured aneurysm is a medical emergency.  Get help right away if you have symptoms of stroke. The acronym BEFAST is an easy way to remember the main warning signs of stroke. This information is not intended to replace advice given to you by your health care provider. Make sure you discuss any questions you have with your health care provider. Document Revised: 12/25/2017 Document Reviewed: 05/15/2016 Elsevier Patient Education  [place].

## 2019-05-23 NOTE — Progress Notes (Signed)
[facility]    Provider:  Dr Jaynee Eagles Referring Provider: [name], MD Primary Care Physician:  [name], MD  CC:  Episodic right-sided weakness  HPI:  Christine Steele is a 69 y.o. female here as a referral from Dr. Shelia Media, she has been seen in the past for a constellation of symptoms such as bilateral weakness, orthostasis due to dehydration, we saw an aneurysm on MRA of the head a few years ago and she is here because she is worried about this. She is still having similar symptoms as before, numbness on the right side, all the way to her feet. The symptoms went away for a while but now having them again but no changes in quality or duration, she still reports stress when that happens or when she doesn't drink  Lot she is on her feet a long time, if he lays down she feels better, no dizziness, not lightheaded, never passed out, she does feel weak with the episodes nd the next day she feels a little funny from it. She also feels it on the left side and feels funny, so unlikely seizures, always with stress. She states she had carotid Dopplers at Dr. Pennie Banter. She denies dizziness recently but she once had dizziness with the episode. Reviewed labs 04/26/19 BUN 10 Creatinine 1.2. No other focal neurologic deficits, associated symptoms, inciting events or modifiable factors.  MRA head 2017: personally reviewed images and agree with the following 1. No acute intracranial abnormality. 2. Unchanged, minimal cerebral white matter disease which is nonspecific but may reflect the sequelae of chronic small vessel ischemia. 3. Unchanged 2 mm right MCA aneurysm. 4. No major intracranial arterial occlusion or significant stenosis.  I reviewed Dr. Katrine Coho notes, he referred her for follow-up of cerebral aneurysm, intermittent dizziness, feeling funny in the head, so she went to bed, comes on when she gets overwhelmed or under pressure, but overall she does not feel generally  anxious, she also had an episode where she felt weak on the right side and her face felt funny in the upper chest felt like gas not pain or pressure, she felt short of breath 2 days did not last long, no signs of depression, examination in general appearance normal, they checked an x-ray which was normal, EKG was normal, Dr. Mare Ferrari patient more worried that symptoms could be from the aneurysm and she was sent over to be evaluated.  Interval update 11/28/2015:  She returns for follow up. She has not really had anymore episode. Her blood pressure was elevated and her heart rate was low. It made her feel bad. She had some right sided weakness and she went to bed and it resolved. She not drink water consistently. She was minimally orthostatic last appointment. She often doesn;t drink any water during the day. At night she drinks more. With the weakness she doesn't feel dizzy or lightheaded but helps to lay down. Sheis more tired at the end of the day. No double vision, blurry vision, eyelid drooping. Will check labs for myasthenia gravis. She feels tingling in the feet and arm and radiates down the entire right side. She describes funny paresthesias.   HPI:  Christine Steele is a 69 y.o. female here as a referral from Dr. Jenny Reichmann for right-sided feelings of numbness and weakness however she denies actually being weak, just feels like it. PMHx of HTN, untreated OSA, stable small mca aneurysm.  She has been having symptoms since 2011, husband endorses she has had  these symptoms for years.For a long time her blood pressure would be very low when these episodes would happen but that has not happened since they changed her medications  She has had several MRIs and MRAs and they have been stable. She feels weak and numb on the right side. Mostly at night when she gets home and lays on the couch. She says that she works hard without a break. Started again on Sunday night after she took the metoprolol and laid down and that is  when she started feeling bad. It may have happened after taking the metoprolol. She has had problems with BP and HR fluctuating. She never wakes up with the weakness. She starts feeling very weak on the right side from the face down to the leg. She has had episodes of low blood pressure in the past, but she checked her BP and it was fine 133  But last night symptoms were on the left side. The right side of the face feels funny. Her face feels funny but denies actual weakness or facial droop. Her husband doesn't notice a difference or sees any weakness. Patient goes and lays down. She can use all her limbs, no weakness but she just feels weak. No vision changes, no speech problems, when she is driving she feels fatigued. Symptoms are not that often. It happens when she is tired or stressed. Majority of time it is on the right side but it is sometimes on the left as well. She feels funny in the stomach. Husband is here, says she doesn't eat any meat and she eats mostly vegetables. Happened Sunday nioght, Thursday night, Friday night. Each time it was after she had lays down and had already taken her medicine. She can't give me details. She has a funny feeling on her head like water running inside her head.  She doesn't take her pulse. Husband says a lot of things in the family have happened and she worries.   Reviewed notes, labs and imaging from outside physicians, which showed:  Personally reviewed MRI of the brain/MRA of the head 05/2015 and agree with the following. They are normal for her age:  IMPRESSION: 1. No acute intracranial abnormality. 2. Unchanged, minimal cerebral white matter disease which is nonspecific but may reflect the sequelae of chronic small vessel ischemia. 3. Unchanged 2 mm right MCA aneurysm. 4. No major intracranial arterial occlusion or significant stenosis.  CK nml  Review of Systems: Patient complains of symptoms per HPI as well as the following symptoms: swelling in  hands, blured vision, eye pain, joint pain, numbness, weakness, snoring, joint pain, allergies. Pertinent negatives per HPI. All others negative.    Social History   Socioeconomic History  . Marital status: Married    Spouse name: Trilby Drummer  . Number of children: 0  . Years of education: 12+  . Highest education level: Not on file  Occupational History  . Not on file  Tobacco Use  . Smoking status: Former Smoker    Packs/day: 1.00    Years: 5.00    Pack years: 5.00    Types: Cigarettes    Quit date: 04/22/1979    Years since quitting: 40.1  . Smokeless tobacco: Never Used  Substance and Sexual Activity  . Alcohol use: No    Alcohol/week: 0.0 standard drinks  . Drug use: No  . Sexual activity: Yes    Partners: Male    Birth control/protection: Post-menopausal    Comment: 1ST Intercourse- 44, partners-  6, married- 19 yrs   Other Topics Concern  . Not on file  Social History Narrative   Lives with husband   Caffeine use: none   Right handed   Social Determinants of Health   Financial Resource Strain:   . Difficulty of Paying Living Expenses: Not on file  Food Insecurity:   . Worried About Charity fundraiser in the Last Year: Not on file  . Ran Out of Food in the Last Year: Not on file  Transportation Needs:   . Lack of Transportation (Medical): Not on file  . Lack of Transportation (Non-Medical): Not on file  Physical Activity:   . Days of Exercise per Week: Not on file  . Minutes of Exercise per Session: Not on file  Stress:   . Feeling of Stress : Not on file  Social Connections:   . Frequency of Communication with Friends and Family: Not on file  . Frequency of Social Gatherings with Friends and Family: Not on file  . Attends Religious Services: Not on file  . Active Member of Clubs or Organizations: Not on file  . Attends Archivist Meetings: Not on file  . Marital Status: Not on file  Intimate Partner Violence:   . Fear of Current or Ex-Partner:  Not on file  . Emotionally Abused: Not on file  . Physically Abused: Not on file  . Sexually Abused: Not on file    Family History  Problem Relation Age of Onset  . Hypertension Mother   . Heart attack Father   . Stroke Father   . Aneurysm Neg Hx     Past Medical History:  Diagnosis Date  . Aneurysm (HCC)    76mm Right MCA  . Atypical chest pain 03/31/2018  . Cataract    Bilateral  . Chronic kidney disease    Dr. Shelia Media (PCP)   . Hypertension   . OSA (obstructive sleep apnea) 03/31/2018  . OSA on CPAP    havent used CPAP in long time per patient due to unable to get filters to change out  . Osteopenia 01/2017   T score -1.4 FRAX 3.4% / 0.4%  . Potassium (K) deficiency    no longer taking medication for potassium MD no longer prescribed  . Tachycardia   . Vitamin D deficiency     Past Surgical History:  Procedure Laterality Date  . BIOPSY BREAST  1970   Right Breast  . COLONOSCOPY    . DILATATION & CURETTAGE/HYSTEROSCOPY WITH MYOSURE N/A 03/06/2017   Procedure: DILATATION & CURETTAGE/HYSTEROSCOPY WITH MYOSURE;  Surgeon: [name], MD;  Location: [facility];  Service: Gynecology;  Laterality: N/A;  Request 7:30am in [facility] block time  requests one hour  . right lumpectomy     benign    Current Outpatient Medications  Medication Sig Dispense Refill  . Ascorbic Acid (VITAMIN C PO) Take by mouth daily.    Marland Kitchen aspirin EC 325 MG tablet Take 325 mg by mouth every other day.    Marland Kitchen atenolol (TENORMIN) 25 MG tablet Take 25 mg by mouth daily.     . Cholecalciferol (VITAMIN D PO) Take 1,000 Units by mouth.     . irbesartan (AVAPRO) 300 MG tablet Take 300 mg by mouth daily.    . Multiple Vitamin (MULTIVITAMIN WITH MINERALS) TABS tablet Take 1 tablet by mouth daily.    . rosuvastatin (CRESTOR) 5 MG tablet Take 5 mg by mouth daily.    Marland Kitchen  Turmeric 500 MG CAPS Take by mouth. Occasionally. Also has ginger included.     No current  facility-administered medications for this visit.    Allergies as of 05/23/2019 - Review Complete 05/23/2019  Allergen Reaction Noted  . Amlodipine  05/24/2015  . Chocolate Other (See Comments) 03/03/2017  . Coconut fatty acids Other (See Comments) 03/06/2017  . Other  03/03/2017  . Peanut-containing drug products  03/03/2017    Vitals: BP 124/78 (BP Location: Left Arm, Patient Position: Sitting)   Pulse 67   Temp (!) 97.2 F (36.2 C) Comment: taken at front  Ht 5\' 3"  (1.6 m)   Wt 182 lb 8 oz (82.8 kg) Comment: 188 lb on scale but pt had a boot on. pt report 2/1 wt 182.5  BMI 32.33 kg/m  Last Weight:  Wt Readings from Last 1 Encounters:  05/23/19 182 lb 8 oz (82.8 kg)   Last Height:   Ht Readings from Last 1 Encounters:  05/23/19 5\' 3"  (1.6 m)   Speech:    Speech is normal; fluent and spontaneous with normal comprehension.  Cognition:    The patient is oriented to person, place, and time;     recent and remote memory intact;     language fluent;     normal attention, concentration,     fund of knowledge Cranial Nerves:    The pupils are equal, round, and reactive to light. Attempted fundoscopy could not visualize due to small pupiulst. Visual fields are full to finger confrontation. Extraocular movements are intact. Trigeminal sensation is intact and the muscles of mastication are normal. The face is symmetric. The palate elevates in the midline. Hearing intact. Voice is normal. Shoulder shrug is normal. The tongue has normal motion without fasciculations.   Coordination:    No dysmetria  Gait:    not ataxic or dysmetric or parkinsonian  Motor Observation:    No asymmetry, no atrophy, and no involuntary movements noted. Tone:    Normal muscle tone.    Posture:    Posture is normal. normal erect    Strength:    Strength is V/V in the upper and lower limbs.      Sensation: intact to LT     Reflex Exam:  DTR's:    Deep tendon reflexes in the upper and  lower extremities are brisk bilaterally.  (Deferred left due to pain and brace Toes:    The right toe down. Deffered left due to pain and brace   Clonus:    Clonus is absent.       Assessment/Plan:  69 year old who was initially seen in 2017: Patient with 10 years of occasional unilateral mild feeling of weakness, mostly right side but can also be on the left side too. Mostly at night when she is tired and possibly stressed. She has had multiple MRIs over the years that have been unremarkable and normal for her age. In workup found right 76mm MCA aneurysm.  Neuro exam is non focal . I discussed with patient and her husband in the past, they declined MRI cervical spine (brisk reflexes). The symptoms have occurred on both sides so would be unusual for seizure but offered an EEG in the past and they declined, at this time I still don't think it is sezures. Unusual for TIAs(bilateralt, stereotyped).. I recommended keeping a diary of when the symptoms occur, how long they last, what medications or food she takes before the incident, what she was doing that day and  how fatigued she felt, and check both blood pressure and pulse laying and standing. Advised to drink plenty of fluids.  We will check CTA head to follow the aneurysm.  Follow up in one year to recheck aneurysm, discussed should watch it regularly. Manage blood pressure and stress.  Orders Placed This Encounter  Procedures  . CT ANGIO HEAD W OR WO CONTRAST  . Basic Metabolic Panel   Sarina Ill, MD  [facility] [address] [place], [place] [address]  Phone [phone] Fax [phone]

## 2019-05-27 ENCOUNTER — Telehealth: Payer: Self-pay | Admitting: Neurology

## 2019-05-27 NOTE — Telephone Encounter (Signed)
Medicare/BCBS fed order sent to GI. No auth they will reach out to the patient to schedule.  

## 2019-06-07 ENCOUNTER — Telehealth: Payer: Self-pay | Admitting: *Deleted

## 2019-06-07 NOTE — Telephone Encounter (Signed)
Discussed with patient and she will proceed with getting scheduled , last Cr 1.2 She will come to office prior to labs   Sabra Heck, Alona Bene, Varney Biles, LPN  [place]   I spoke with patient today trying to schedule her for Cardiac Ct , she states that her PCP told her not to have any test with the contrast media because she has chronic kidney disease? Please call patient and speak with her and let me know how to proceed.   Thank you  Romie Minus

## 2019-06-10 ENCOUNTER — Other Ambulatory Visit: Payer: Self-pay

## 2019-06-13 ENCOUNTER — Other Ambulatory Visit: Payer: Self-pay

## 2019-06-13 ENCOUNTER — Ambulatory Visit (INDEPENDENT_AMBULATORY_CARE_PROVIDER_SITE_OTHER): Payer: Medicare Other | Admitting: Obstetrics & Gynecology

## 2019-06-13 ENCOUNTER — Encounter: Payer: Self-pay | Admitting: Obstetrics & Gynecology

## 2019-06-13 VITALS — BP 126/76 | Ht 62.5 in | Wt 184.0 lb

## 2019-06-13 DIAGNOSIS — Z01419 Encounter for gynecological examination (general) (routine) without abnormal findings: Secondary | ICD-10-CM

## 2019-06-13 DIAGNOSIS — Z6833 Body mass index (BMI) 33.0-33.9, adult: Secondary | ICD-10-CM

## 2019-06-13 DIAGNOSIS — Z9189 Other specified personal risk factors, not elsewhere classified: Secondary | ICD-10-CM

## 2019-06-13 DIAGNOSIS — E6609 Other obesity due to excess calories: Secondary | ICD-10-CM

## 2019-06-13 DIAGNOSIS — M8588 Other specified disorders of bone density and structure, other site: Secondary | ICD-10-CM

## 2019-06-13 DIAGNOSIS — Z780 Asymptomatic menopausal state: Secondary | ICD-10-CM

## 2019-06-13 DIAGNOSIS — M85852 Other specified disorders of bone density and structure, left thigh: Secondary | ICD-10-CM

## 2019-06-13 NOTE — Progress Notes (Signed)
Christine Steele 01-Sep-1950 [id number]   History:    69 y.o. G2P1A1L1  RP:  Established patient presenting for annual gyn exam   HPI:  Menopause.  No HRT.  No PMB x 03/2017 post HSC/Excision of Polyps/D+C.  No pelvic pain.  No pain with IC.  Breasts wnl.  Mictions/BMs wnl.  BMI 33.12.  Not exercising regularly.  Health labs with Fam MD.  Past medical history,surgical history, family history and social history were all reviewed and documented in the EPIC chart.  Gynecologic History No LMP recorded. Patient is postmenopausal.  Obstetric History OB History  Gravida Para Term Preterm AB Living  2 1     1 1   SAB TAB Ectopic Multiple Live Births  1            # Outcome Date GA Lbr Len/2nd Weight Sex Delivery Anes PTL Lv  2 SAB           1 Para              ROS: A ROS was performed and pertinent positives and negatives are included in the history.  GENERAL: No fevers or chills. HEENT: No change in vision, no earache, sore throat or sinus congestion. NECK: No pain or stiffness. CARDIOVASCULAR: No chest pain or pressure. No palpitations. PULMONARY: No shortness of breath, cough or wheeze. GASTROINTESTINAL: No abdominal pain, nausea, vomiting or diarrhea, melena or bright red blood per rectum. GENITOURINARY: No urinary frequency, urgency, hesitancy or dysuria. MUSCULOSKELETAL: No joint or muscle pain, no back pain, no recent trauma. DERMATOLOGIC: No rash, no itching, no lesions. ENDOCRINE: No polyuria, polydipsia, no heat or cold intolerance. No recent change in weight. HEMATOLOGICAL: No anemia or easy bruising or bleeding. NEUROLOGIC: No headache, seizures, numbness, tingling or weakness. PSYCHIATRIC: No depression, no loss of interest in normal activity or change in sleep pattern.     Exam:   BP 126/76 (BP Location: Right Arm, Patient Position: Sitting, Cuff Size: Normal)   Ht 5' 2.5" (1.588 m)   Wt 184 lb (83.5 kg)   BMI 33.12 kg/m   Body mass index is 33.12 kg/m.  General  appearance : Well developed well nourished female. No acute distress HEENT: Eyes: no retinal hemorrhage or exudates,  Neck supple, trachea midline, no carotid bruits, no thyroidmegaly Lungs: Clear to auscultation, no rhonchi or wheezes, or rib retractions  Heart: Regular rate and rhythm, no murmurs or gallops Breast:Examined in sitting and supine position were symmetrical in appearance, no palpable masses or tenderness,  no skin retraction, no nipple inversion, no nipple discharge, no skin discoloration, no axillary or supraclavicular lymphadenopathy Abdomen: no palpable masses or tenderness, no rebound or guarding Extremities: no edema or skin discoloration or tenderness  Pelvic: Vulva: Normal             Vagina: No gross lesions or discharge  Cervix: No gross lesions or discharge.  Pap reflex done.  Uterus  AV, normal size, shape and consistency, non-tender and mobile  Adnexa  Without masses or tenderness  Anus: Normal   Assessment/Plan:  69 y.o. female for annual exam   1. Encounter for routine gynecological examination with Papanicolaou smear of cervix Normal gynecologic exam in menopause.  Pap reflex done.  Breast exam normal.  Needs to schedule a screening mammogram now.  Colonoscopies and health labs through Dr. Shelia Media.  2. Postmenopause Post menopause, well on no hormone replacement therapy.  No postmenopausal bleeding.  3. Osteopenia of neck of left femur Last  bone density October 2018 showed osteopenia with the lowest T score at -1.4 at the left neck of the femur.  Will schedule a repeat bone density here now.  Vitamin D supplements, calcium intake of 1200 mg daily and regular weightbearing physical activities recommended. - DG Bone Density; Future  4. Class 1 obesity due to excess calories with serious comorbidity and body mass index (BMI) of 33.0 to 33.9 in adult Recommend a lower calorie/carb diet such as [employer].  Aerobic physical activities 5 times a week and  weightlifting every 2 days.  [name] MD, 2:14 PM 06/13/2019

## 2019-06-14 ENCOUNTER — Encounter: Payer: Self-pay | Admitting: Obstetrics & Gynecology

## 2019-06-14 LAB — PAP IG W/ RFLX HPV ASCU

## 2019-06-14 NOTE — Patient Instructions (Signed)
1. Encounter for routine gynecological examination with Papanicolaou smear of cervix Normal gynecologic exam in menopause.  Pap reflex done.  Breast exam normal.  Needs to schedule a screening mammogram now.  Colonoscopies and health labs through Dr. Shelia Media.  2. Postmenopause Post menopause, well on no hormone replacement therapy.  No postmenopausal bleeding.  3. Osteopenia of neck of left femur Last bone density October 2018 showed osteopenia with the lowest T score at -1.4 at the left neck of the femur.  Will schedule a repeat bone density here now.  Vitamin D supplements, calcium intake of 1200 mg daily and regular weightbearing physical activities recommended. - DG Bone Density; Future  4. Class 1 obesity due to excess calories with serious comorbidity and body mass index (BMI) of 33.0 to 33.9 in adult Recommend a lower calorie/carb diet such as [employer].  Aerobic physical activities 5 times a week and weightlifting every 2 days.  Christine Steele, it was a pleasure seeing you today!  I will inform you of your results as soon as they are available.

## 2019-06-16 ENCOUNTER — Telehealth: Payer: Self-pay | Admitting: Cardiovascular Disease

## 2019-06-16 NOTE — Telephone Encounter (Signed)
Patient calling in regards to her CPAP machine. She states she has been waking up with a burning in her nose and takes the mask off. She says she is not sure why it's been happening and that last time she only had it on for a few hours. Please advise.

## 2019-06-16 NOTE — Telephone Encounter (Signed)
LMTCB

## 2019-06-22 DIAGNOSIS — M19079 Primary osteoarthritis, unspecified ankle and foot: Secondary | ICD-10-CM | POA: Diagnosis not present

## 2019-06-22 DIAGNOSIS — M79671 Pain in right foot: Secondary | ICD-10-CM | POA: Diagnosis not present

## 2019-06-23 DIAGNOSIS — M109 Gout, unspecified: Secondary | ICD-10-CM | POA: Diagnosis not present

## 2019-06-23 DIAGNOSIS — I10 Essential (primary) hypertension: Secondary | ICD-10-CM | POA: Diagnosis not present

## 2019-06-28 NOTE — Telephone Encounter (Signed)
Opened in error

## 2019-07-04 DIAGNOSIS — Z01812 Encounter for preprocedural laboratory examination: Secondary | ICD-10-CM | POA: Diagnosis not present

## 2019-07-04 DIAGNOSIS — G4733 Obstructive sleep apnea (adult) (pediatric): Secondary | ICD-10-CM | POA: Diagnosis not present

## 2019-07-04 DIAGNOSIS — I10 Essential (primary) hypertension: Secondary | ICD-10-CM | POA: Diagnosis not present

## 2019-07-05 LAB — BASIC METABOLIC PANEL
BUN/Creatinine Ratio: 13 (ref 12–28)
BUN: 13 mg/dL (ref 8–27)
CO2: 20 mmol/L (ref 20–29)
Calcium: 9.3 mg/dL (ref 8.7–10.3)
Chloride: 106 mmol/L (ref 96–106)
Creatinine, Ser: 1.03 mg/dL — ABNORMAL HIGH (ref 0.57–1.00)
GFR calc Af Amer: 65 mL/min/{1.73_m2} (ref >59)
GFR calc non Af Amer: 56 mL/min/{1.73_m2} — ABNORMAL LOW (ref >59)
Glucose: 70 mg/dL (ref 65–99)
Potassium: 4.4 mmol/L (ref 3.5–5.2)
Sodium: 141 mmol/L (ref 134–144)

## 2019-07-07 ENCOUNTER — Telehealth (HOSPITAL_COMMUNITY): Payer: Self-pay | Admitting: Emergency Medicine

## 2019-07-07 ENCOUNTER — Encounter (HOSPITAL_COMMUNITY): Payer: Self-pay

## 2019-07-07 NOTE — Telephone Encounter (Signed)
Reaching out to patient to offer assistance regarding upcoming cardiac imaging study; pt verbalizes understanding of appt date/time, parking situation and where to check in, pre-test NPO status and medications ordered, and verified current allergies; name and call back number provided for further questions should they arise Makyiah Lie RN Navigator Cardiac Imaging [facility] [phone] office [phone] cell 

## 2019-07-08 ENCOUNTER — Other Ambulatory Visit: Payer: Self-pay

## 2019-07-08 ENCOUNTER — Ambulatory Visit (HOSPITAL_COMMUNITY)
Admission: RE | Admit: 2019-07-08 | Discharge: 2019-07-08 | Disposition: A | Discharge status: Home / Self Care | Payer: Medicare Other | Source: Ambulatory Visit | Attending: Cardiovascular Disease | Admitting: Cardiovascular Disease

## 2019-07-08 DIAGNOSIS — I671 Cerebral aneurysm, nonruptured: Secondary | ICD-10-CM | POA: Diagnosis not present

## 2019-07-08 DIAGNOSIS — G4733 Obstructive sleep apnea (adult) (pediatric): Secondary | ICD-10-CM | POA: Diagnosis not present

## 2019-07-08 DIAGNOSIS — R072 Precordial pain: Secondary | ICD-10-CM | POA: Insufficient documentation

## 2019-07-08 DIAGNOSIS — I10 Essential (primary) hypertension: Secondary | ICD-10-CM | POA: Diagnosis not present

## 2019-07-08 DIAGNOSIS — Z8249 Family history of ischemic heart disease and other diseases of the circulatory system: Secondary | ICD-10-CM

## 2019-07-08 MED ORDER — NITROGLYCERIN 0.4 MG SL SUBL: 0.8000 mg | SUBLINGUAL_TABLET | Freq: Once | SUBLINGUAL | Status: DC

## 2019-07-08 MED ORDER — IOHEXOL 350 MG/ML SOLN
140.0000 mL | Freq: Once | INTRAVENOUS | Status: AC | PRN
  Administered 2019-07-08: 16:00:00 140 mL via INTRAVENOUS

## 2019-07-08 MED ORDER — NITROGLYCERIN 0.4 MG SL SUBL
SUBLINGUAL_TABLET | SUBLINGUAL | Status: AC
  Filled 2019-07-08: qty 2

## 2019-07-08 MED ORDER — METOPROLOL TARTRATE 5 MG/5ML IV SOLN
INTRAVENOUS | Status: AC
  Filled 2019-07-08: qty 10

## 2019-07-08 MED ORDER — METOPROLOL TARTRATE 5 MG/5ML IV SOLN: 5.0000 mg | INTRAVENOUS | Status: DC | PRN

## 2019-07-11 ENCOUNTER — Ambulatory Visit (HOSPITAL_COMMUNITY)
Admission: RE | Admit: 2019-07-11 | Discharge: 2019-07-11 | Disposition: A | Discharge status: Home / Self Care | Payer: Medicare Other | Source: Ambulatory Visit | Attending: Cardiovascular Disease | Admitting: Cardiovascular Disease

## 2019-07-11 DIAGNOSIS — G4733 Obstructive sleep apnea (adult) (pediatric): Secondary | ICD-10-CM

## 2019-07-11 DIAGNOSIS — I10 Essential (primary) hypertension: Secondary | ICD-10-CM

## 2019-07-11 DIAGNOSIS — R072 Precordial pain: Secondary | ICD-10-CM

## 2019-07-11 DIAGNOSIS — Z8249 Family history of ischemic heart disease and other diseases of the circulatory system: Secondary | ICD-10-CM

## 2019-07-13 ENCOUNTER — Telehealth: Payer: Self-pay | Admitting: Cardiovascular Disease

## 2019-07-13 ENCOUNTER — Telehealth: Payer: Self-pay | Admitting: Neurology

## 2019-07-13 DIAGNOSIS — I10 Essential (primary) hypertension: Secondary | ICD-10-CM

## 2019-07-13 DIAGNOSIS — Z5181 Encounter for therapeutic drug level monitoring: Secondary | ICD-10-CM

## 2019-07-13 DIAGNOSIS — E669 Obesity, unspecified: Secondary | ICD-10-CM

## 2019-07-13 DIAGNOSIS — E785 Hyperlipidemia, unspecified: Secondary | ICD-10-CM

## 2019-07-13 NOTE — Telephone Encounter (Signed)
Returned a call to patient .she states that the inside of her nostrils " burn". I recommended that she go get some saline over the counter. Use it nightly before she goes to bed and also in the mornings when she gets up. This will give her nostrils some moisture. If this does not help call back to let me know.

## 2019-07-13 NOTE — Telephone Encounter (Signed)
Called pt back and answered her questions about the CT-A. Pt was concerned about the mild chronic small vessel ischemic disease noted. I advised pt this is seen with aging. We discussed pt keeping control of any risk factors like high blood pressure, cholesterol, etc to help keep this from getting worse out of proportion to age. Pt verbalized appreciation for the call.

## 2019-07-13 NOTE — Telephone Encounter (Signed)
Patient is requesting to get the results from her CT. She states the information she see's on mychart are confusing.

## 2019-07-13 NOTE — Telephone Encounter (Signed)
Patient calling for Cardiac CT results Will forward to Dr Oval Linsey for review

## 2019-07-13 NOTE — Telephone Encounter (Signed)
Pt called requesting a cb in regards to CT results

## 2019-07-15 ENCOUNTER — Ambulatory Visit: Payer: Medicare Other | Attending: Internal Medicine

## 2019-07-15 DIAGNOSIS — Z23 Encounter for immunization: Secondary | ICD-10-CM

## 2019-07-15 MED ORDER — ROSUVASTATIN CALCIUM 20 MG PO TABS: 20.0000 mg | ORAL_TABLET | Freq: Every day | ORAL | 1 refills | Status: DC

## 2019-07-15 NOTE — Telephone Encounter (Signed)
-----   Message from Skeet Latch, MD sent at 07/15/2019  8:16 AM EDT ----- Discussed with patient.  Please refer to PREP and a nutritionist.  Increase rosuvastatin to 20mg .  Repeat lipids/CMP in 6-8 weeks.  F/u 3 months.

## 2019-07-15 NOTE — Progress Notes (Signed)
   Covid-19 Vaccination Clinic  Name:  Christine Steele    MRN: [id number] DOB: 03-12-1951  07/15/2019  Christine Steele was observed post Covid-19 immunization for 15 minutes without incident. She was provided with Vaccine Information Sheet and instruction to access the V-Safe system.   Christine Steele was instructed to call 911 with any severe reactions post vaccine: Marland Kitchen Difficulty breathing  . Swelling of face and throat  . A fast heartbeat  . A bad rash all over body  . Dizziness and weakness   Immunizations Administered    Name Date Dose VIS Date Route   Pfizer COVID-19 Vaccine 07/15/2019  9:59 AM 0.3 mL 04/01/2019 Intramuscular   Manufacturer: [place]   Lot: [id number]   [place]: [id number]

## 2019-07-15 NOTE — Telephone Encounter (Signed)
Message sent to PREP, referral placed, Rx sent to [facility], and lab orders mailed to patient. Advised patient and she is to call office week of 4/5 if she has not heard from nutrition

## 2019-07-19 ENCOUNTER — Telehealth: Payer: Self-pay

## 2019-07-19 NOTE — Telephone Encounter (Signed)
Call placed to patient reference referral from Dr Oval Linsey. Patient called right back. She is interested in PREP. Can do daytime classes. Advised do not have next class date set but will call back to complete intake when decided.

## 2019-07-25 ENCOUNTER — Telehealth: Payer: Self-pay | Admitting: Cardiovascular Disease

## 2019-07-25 ENCOUNTER — Other Ambulatory Visit: Payer: Self-pay

## 2019-07-25 NOTE — Telephone Encounter (Signed)
I spoke with patient. She has been having some vaginal bleeding. Describes as spotting. She is asking if this could be related to dye used during CT Scan.  I told her this should not be related to scan or dye.  She has a gynecologist and will contact that office regarding bleeding.

## 2019-07-25 NOTE — Telephone Encounter (Signed)
New Message    Pt is calling and is wanting the nurse to call her  She says she has questions regarding the CT she had done    Please call

## 2019-07-26 ENCOUNTER — Encounter: Payer: Self-pay | Admitting: Obstetrics & Gynecology

## 2019-07-26 ENCOUNTER — Ambulatory Visit (INDEPENDENT_AMBULATORY_CARE_PROVIDER_SITE_OTHER): Payer: Medicare Other | Admitting: Obstetrics & Gynecology

## 2019-07-26 ENCOUNTER — Other Ambulatory Visit: Payer: Self-pay

## 2019-07-26 VITALS — BP 140/90

## 2019-07-26 DIAGNOSIS — N950 Postmenopausal bleeding: Secondary | ICD-10-CM

## 2019-07-26 NOTE — Progress Notes (Signed)
    Christine Steele 23-Aug-1950 [id number]        69 y.o.  G2P0011   RP: PMB x 1 week  HPI: PMB x 1 week with cramping.  Abstinent x many months.  Postmenopause on no HRT.  H/O Endometrial Polyp removed in 2018.    OB History  Gravida Para Term Preterm AB Living  2 1     1 1   SAB TAB Ectopic Multiple Live Births  1            # Outcome Date GA Lbr Len/2nd Weight Sex Delivery Anes PTL Lv  2 SAB           1 Para             Past medical history,surgical history, problem list, medications, allergies, family history and social history were all reviewed and documented in the EPIC chart.   Directed ROS with pertinent positives and negatives documented in the history of present illness/assessment and plan.  Exam:  Vitals:   07/26/19 1134  BP: 140/90   General appearance:  Normal  Abdomen: Normal  Gynecologic exam: Vulva normal.  Speculum:  Cervix/Vagina normal.  No bleeding.  Normal secretions.  Bimanual exam:  Uterus AV, normal volume, NT, mobile.  No adnexal mass, NT bilaterally.   Assessment/Plan:  69 y.o. G2P0011   1. Postmenopausal bleeding Mild postmenopausal bleeding for 1 week.  History of endometrial polyp excised in 2018.  Decision to proceed with further investigation with a pelvic ultrasound to rule out endometrial pathology including endometrial polyp, fibroid, endometrial hyperplasia and endometrial cancer. - US Transvaginal Non-OB; Future  Other orders - calcium-vitamin D 250-100 MG-UNIT tablet; Take 1 tablet by mouth 2 (two) times daily.  [name] MD, 11:58 AM 07/26/2019

## 2019-08-03 ENCOUNTER — Other Ambulatory Visit: Payer: Self-pay

## 2019-08-03 ENCOUNTER — Telehealth: Payer: Self-pay | Admitting: *Deleted

## 2019-08-03 ENCOUNTER — Encounter: Payer: Self-pay | Admitting: Obstetrics & Gynecology

## 2019-08-03 NOTE — Patient Instructions (Signed)
1. Postmenopausal bleeding Mild postmenopausal bleeding for 1 week.  History of endometrial polyp excised in 2018.  Decision to proceed with further investigation with a pelvic ultrasound to rule out endometrial pathology including endometrial polyp, fibroid, endometrial hyperplasia and endometrial cancer. - US Transvaginal Non-OB; Future  Other orders - calcium-vitamin D 250-100 MG-UNIT tablet; Take 1 tablet by mouth 2 (two) times daily.  Christine Steele, it was a pleasure seeing you today!

## 2019-08-03 NOTE — Telephone Encounter (Signed)
Patient called scheduled for vaginal ultrasound 08/04/19, report she takes prednisone and gout medication asked if the will cause issues with ultrasound, I explained no issues with taking this mediation and having ultrasound. Will keep scheduled appointment.

## 2019-08-04 ENCOUNTER — Telehealth: Payer: Self-pay

## 2019-08-04 ENCOUNTER — Ambulatory Visit (INDEPENDENT_AMBULATORY_CARE_PROVIDER_SITE_OTHER): Payer: Medicare Other

## 2019-08-04 ENCOUNTER — Ambulatory Visit (INDEPENDENT_AMBULATORY_CARE_PROVIDER_SITE_OTHER): Payer: Medicare Other | Admitting: Obstetrics & Gynecology

## 2019-08-04 ENCOUNTER — Encounter: Payer: Self-pay | Admitting: Obstetrics & Gynecology

## 2019-08-04 VITALS — BP 136/88

## 2019-08-04 DIAGNOSIS — R9389 Abnormal findings on diagnostic imaging of other specified body structures: Secondary | ICD-10-CM | POA: Diagnosis not present

## 2019-08-04 DIAGNOSIS — N950 Postmenopausal bleeding: Secondary | ICD-10-CM

## 2019-08-04 NOTE — Progress Notes (Signed)
    Christine Steele 18-May-1950 [id number]        68 y.o.  G2P0011   RP:  PMB x 1 week for Pelvic US  HPI: PMB x 1 week with cramping.  Abstinent x many months.  Postmenopause on no HRT.  H/O Endometrial Polyp removed in 2018.     OB History  Gravida Para Term Preterm AB Living  2 1     1 1   SAB TAB Ectopic Multiple Live Births  1            # Outcome Date GA Lbr Len/2nd Weight Sex Delivery Anes PTL Lv  2 SAB           1 Para             Past medical history,surgical history, problem list, medications, allergies, family history and social history were all reviewed and documented in the EPIC chart.   Directed ROS with pertinent positives and negatives documented in the history of present illness/assessment and plan.  Exam:  There were no vitals filed for this visit. General appearance:  Normal  Pelvic US today: T/V images.  Comparison is made with previous scan in 2018.  Enlarged, irregular anteverted uterus with several intramural and subserosal fibroids which are smaller today than on the previous scan.  The uterus is measured at 7.66 x 5.63 x 6.63 cm.  The endometrial lining is thickened with cystic changes, measured at 10.12 mm.  Feeder vessels are identified to the thickened area similar in appearance to the previous scan prior to the hysteroscopy with excision and D&C.  Right ovary small with atrophic appearance.  Left ovary with a 3.0 x 2.2 cm simple cystic structure similar in size and appearance to previous scan.  No free fluid in the posterior cul-de-sac.   Assessment/Plan:  69 y.o. G2P0011   1. Postmenopausal bleeding Postmenopausal bleeding x1 week on no hormone replacement therapy.  History of endometrial polyp excised in 2018.  Pelvic ultrasound findings reviewed with patient.  The uterus is normal, but the endometrial lining is thickened with cystic changes, measured at 10.12 mm with feeder vessels to the thickened area.  The appearance is similar to the previous scan  prior to hysteroscopy and excision with D&C in 2018.  Probable endometrial polyps.  Decision to schedule a hysteroscopy with excision and D&C.  Information given as well as pamphlet.  Follow-up preop tele-visit.  2. Endometrial thickening on ultrasound As above.  [name] MD, 11:07 AM 08/04/2019

## 2019-08-04 NOTE — Telephone Encounter (Signed)
Spoke with patient and schedule surgery for 09/13/19 at 8:30am at [facility].  Covid test was scheduled accordingly and I reviewed quarantine protocol after test with her.  Pre op video visit scheduled for 09/05/19 at 2:30pm.  I will mail her a packet. She has 2 insurances so no prepymt due.

## 2019-08-04 NOTE — Patient Instructions (Signed)
1. Postmenopausal bleeding Postmenopausal bleeding x1 week on no hormone replacement therapy.  History of endometrial polyp excised in 2018.  Pelvic ultrasound findings reviewed with patient.  The uterus is normal, but the endometrial lining is thickened with cystic changes, measured at 10.12 mm with feeder vessels to the thickened area.  The appearance is similar to the previous scan prior to hysteroscopy and excision with D&C in 2018.  Probable endometrial polyps.  Decision to schedule a hysteroscopy with excision and D&C.  Information given as well as pamphlet.  Follow-up preop tele-visit.  2. Endometrial thickening on ultrasound As above.  Christine Steele, it was a pleasure seeing you today!

## 2019-08-04 NOTE — Telephone Encounter (Signed)
Orders for sleep equipment signed on 08/03/2019 and faxed back to [place]

## 2019-08-05 ENCOUNTER — Telehealth: Payer: Self-pay

## 2019-08-05 NOTE — Telephone Encounter (Signed)
PREP: Updated next class start date will be 5/3 M/W 230p-345p x 12 wks. Will call pt back for intake appt next week.

## 2019-08-08 ENCOUNTER — Ambulatory Visit: Payer: Medicare Other

## 2019-08-08 NOTE — Telephone Encounter (Signed)
Patient called today to let me know that after talking with husband the surgery date does not work for her.  I rescheduled her surgery date to 10/03/19 and r/s'd her Covid test as well.  Pre op appt was moved later to be closer to surgery date. I will mail packet with new dates/times.

## 2019-08-11 DIAGNOSIS — I671 Cerebral aneurysm, nonruptured: Secondary | ICD-10-CM | POA: Diagnosis not present

## 2019-08-11 DIAGNOSIS — I10 Essential (primary) hypertension: Secondary | ICD-10-CM | POA: Diagnosis not present

## 2019-08-12 ENCOUNTER — Telehealth: Payer: Self-pay

## 2019-08-12 NOTE — Telephone Encounter (Signed)
Called patient to sched intake appt for 5/3 PREP classes Scheduled for 4/26 at 9am at the [facility]

## 2019-08-15 ENCOUNTER — Ambulatory Visit: Payer: Medicare Other | Attending: Internal Medicine

## 2019-08-15 DIAGNOSIS — Z23 Encounter for immunization: Secondary | ICD-10-CM

## 2019-08-15 NOTE — Progress Notes (Signed)
   Covid-19 Vaccination Clinic  Name:  HAVANNAH FORMBY    MRN: [id number] DOB: May 30, 1950  08/15/2019  Ms. Garfias was observed post Covid-19 immunization for 15 minutes without incident. She was provided with Vaccine Information Sheet and instruction to access the V-Safe system.   Ms. [place] was instructed to call 911 with any severe reactions post vaccine: Marland Kitchen Difficulty breathing  . Swelling of face and throat  . A fast heartbeat  . A bad rash all over body  . Dizziness and weakness   Immunizations Administered    Name Date Dose VIS Date Route   Pfizer COVID-19 Vaccine 08/15/2019  3:36 PM 0.3 mL 06/15/2018 Intramuscular   Manufacturer: [place]   Lot: [id number]   [place]: [id number]

## 2019-08-15 NOTE — Progress Notes (Signed)
[facility]   Patient Details  Name: Christine Steele MRN: [id number] Date of Birth: 08-Dec-1950 Age: 69 y.o. PCP: [name], MD  Vitals:   08/15/19 1716  BP: (!) 158/82  Pulse: 60  Resp: 16  SpO2: 98%  Weight: 177 lb 3.2 oz (80.4 kg)  Height: 5\' 3"  (1.6 m)     [name] Eval - 08/15/19 1700      Referral    Referring Provider  HTN clinic    Reason for referral  Hypertension;High Cholesterol    Program Start Date  08/22/19      Measurement   Waist Circumference  38 inches    Hip Circumference  43.5 inches    Body fat  42.5 percent      Information for Trainer   Goals  lower chol, BP mgmt, weight loss 20 lbs    Current Exercise  none    Orthopedic Concerns  gout, bil knee pain with walking    Pertinent Medical History  Chol, Htn, gout, plaque arteries, aneurysm    Current Barriers  none    Restrictions/Precautions  --   none noted   Medications that affect exercise  Beta blocker      Timed Up and Go (TUGS)   Timed Up and Go  Low risk <9 seconds      Mobility and Daily Activities   I find it easy to walk up or down two or more flights of stairs.  1    I have no trouble taking out the trash.  2    I do housework such as vacuuming and dusting on my own without difficulty.  2    I can easily lift a gallon of milk (8lbs).  4    I can easily walk a mile.  2    I have no trouble reaching into high cupboards or reaching down to pick up something from the floor.  4    I do not have trouble doing out-door work such as Armed forces logistics/support/administrative officer, raking leaves, or gardening.  1      Mobility and Daily Activities   I feel younger than my age.  2    I feel independent.  4    I feel energetic.  2    I live an active life.   1    I feel strong.  2    I feel healthy.  2    I feel active as other people my age.  2      How fit and strong are you.   Fit and Strong Total Score  31      Past Medical History:  Diagnosis Date  . Aneurysm (HCC)    74mm Right MCA   . Atypical chest pain 03/31/2018  . Cataract    Bilateral  . Chronic kidney disease    Dr. Shelia Media (PCP)   . Hypertension   . OSA (obstructive sleep apnea) 03/31/2018  . OSA on CPAP    havent used CPAP in long time per patient due to unable to get filters to change out  . Osteopenia 01/2017   T score -1.4 FRAX 3.4% / 0.4%  . Potassium (K) deficiency    no longer taking medication for potassium MD no longer prescribed  . Tachycardia   . Vitamin D deficiency    Past Surgical History:  Procedure Laterality Date  . BIOPSY BREAST  1970   Right Breast  . COLONOSCOPY    .  DILATATION & CURETTAGE/HYSTEROSCOPY WITH MYOSURE N/A 03/06/2017   Procedure: DILATATION & CURETTAGE/HYSTEROSCOPY WITH MYOSURE;  Surgeon: [name], MD;  Location: [facility];  Service: Gynecology;  Laterality: N/A;  Request 7:30am in [facility] block time  requests one hour  . right lumpectomy     benign   Social History   Tobacco Use  Smoking Status Former Smoker  . Packs/day: 1.00  . Years: 5.00  . Pack years: 5.00  . Types: Cigarettes  . Quit date: 04/22/1979  . Years since quitting: 40.3  Smokeless Tobacco Never Used     PREP will start 5/3 x 12 weeks every M/W 230p-345p     Pam M Jaquil Todt 08/15/2019, 5:22 PM

## 2019-08-16 DIAGNOSIS — R5383 Other fatigue: Secondary | ICD-10-CM | POA: Diagnosis not present

## 2019-08-16 DIAGNOSIS — E7800 Pure hypercholesterolemia, unspecified: Secondary | ICD-10-CM | POA: Diagnosis not present

## 2019-08-16 DIAGNOSIS — E559 Vitamin D deficiency, unspecified: Secondary | ICD-10-CM | POA: Diagnosis not present

## 2019-08-16 DIAGNOSIS — I10 Essential (primary) hypertension: Secondary | ICD-10-CM | POA: Diagnosis not present

## 2019-08-16 DIAGNOSIS — M1000 Idiopathic gout, unspecified site: Secondary | ICD-10-CM | POA: Diagnosis not present

## 2019-08-16 DIAGNOSIS — I671 Cerebral aneurysm, nonruptured: Secondary | ICD-10-CM | POA: Diagnosis not present

## 2019-08-16 DIAGNOSIS — Z0000 Encounter for general adult medical examination without abnormal findings: Secondary | ICD-10-CM | POA: Diagnosis not present

## 2019-08-22 DIAGNOSIS — E559 Vitamin D deficiency, unspecified: Secondary | ICD-10-CM | POA: Diagnosis not present

## 2019-08-22 DIAGNOSIS — M1990 Unspecified osteoarthritis, unspecified site: Secondary | ICD-10-CM | POA: Diagnosis not present

## 2019-08-22 DIAGNOSIS — M19071 Primary osteoarthritis, right ankle and foot: Secondary | ICD-10-CM | POA: Diagnosis not present

## 2019-08-22 DIAGNOSIS — N189 Chronic kidney disease, unspecified: Secondary | ICD-10-CM | POA: Diagnosis not present

## 2019-08-22 DIAGNOSIS — M19072 Primary osteoarthritis, left ankle and foot: Secondary | ICD-10-CM | POA: Diagnosis not present

## 2019-08-22 DIAGNOSIS — M79642 Pain in left hand: Secondary | ICD-10-CM | POA: Diagnosis not present

## 2019-08-22 DIAGNOSIS — M79671 Pain in right foot: Secondary | ICD-10-CM | POA: Diagnosis not present

## 2019-08-22 DIAGNOSIS — M1000 Idiopathic gout, unspecified site: Secondary | ICD-10-CM | POA: Diagnosis not present

## 2019-08-22 DIAGNOSIS — M25571 Pain in right ankle and joints of right foot: Secondary | ICD-10-CM | POA: Diagnosis not present

## 2019-08-22 DIAGNOSIS — I10 Essential (primary) hypertension: Secondary | ICD-10-CM | POA: Diagnosis not present

## 2019-08-22 DIAGNOSIS — M19041 Primary osteoarthritis, right hand: Secondary | ICD-10-CM | POA: Diagnosis not present

## 2019-08-22 DIAGNOSIS — M064 Inflammatory polyarthropathy: Secondary | ICD-10-CM | POA: Diagnosis not present

## 2019-08-22 DIAGNOSIS — M25572 Pain in left ankle and joints of left foot: Secondary | ICD-10-CM | POA: Diagnosis not present

## 2019-08-22 DIAGNOSIS — R5383 Other fatigue: Secondary | ICD-10-CM | POA: Diagnosis not present

## 2019-08-22 DIAGNOSIS — M79641 Pain in right hand: Secondary | ICD-10-CM | POA: Diagnosis not present

## 2019-08-22 DIAGNOSIS — M19042 Primary osteoarthritis, left hand: Secondary | ICD-10-CM | POA: Diagnosis not present

## 2019-08-22 DIAGNOSIS — M79672 Pain in left foot: Secondary | ICD-10-CM | POA: Diagnosis not present

## 2019-08-22 NOTE — Progress Notes (Signed)
[facility] PREP Weekly Session   Patient Details  Name: Christine Steele MRN: [id number] Date of Birth: [birth year] Age: 69 y.o. PCP: [name], MD  There were no vitals filed for this visit.  [name] Weekly seesion - 08/22/19 1600      Weekly Session   Topic Discussed  Other   orientation of fitness center, scale of perceived exertion   Classes attended to date  [place] 08/22/2019, 4:19 PM

## 2019-08-26 ENCOUNTER — Encounter: Payer: Medicare Other | Attending: Cardiovascular Disease | Admitting: Dietician

## 2019-08-26 ENCOUNTER — Encounter: Payer: Self-pay | Admitting: Dietician

## 2019-08-26 ENCOUNTER — Other Ambulatory Visit: Payer: Self-pay

## 2019-08-26 DIAGNOSIS — E6609 Other obesity due to excess calories: Secondary | ICD-10-CM | POA: Insufficient documentation

## 2019-08-26 DIAGNOSIS — N1831 Chronic kidney disease, stage 3a: Secondary | ICD-10-CM | POA: Insufficient documentation

## 2019-08-26 DIAGNOSIS — E785 Hyperlipidemia, unspecified: Secondary | ICD-10-CM | POA: Diagnosis not present

## 2019-08-26 DIAGNOSIS — E669 Obesity, unspecified: Secondary | ICD-10-CM | POA: Diagnosis not present

## 2019-08-26 DIAGNOSIS — Z6833 Body mass index (BMI) 33.0-33.9, adult: Secondary | ICD-10-CM | POA: Diagnosis not present

## 2019-08-26 DIAGNOSIS — I10 Essential (primary) hypertension: Secondary | ICD-10-CM | POA: Insufficient documentation

## 2019-08-26 DIAGNOSIS — Z6831 Body mass index (BMI) 31.0-31.9, adult: Secondary | ICD-10-CM | POA: Diagnosis not present

## 2019-08-26 NOTE — Progress Notes (Signed)
  Medical Nutrition Therapy:  Appt start time: 0910 end time:  1015.   Assessment:  Primary concerns today: patient is here today alone.  She states that she had a scan and has plaque buildup in her arteries and would like to improve her diet to help this as well as HTN.  She also would like to avoid diabetes.  History includes:  HTN, HLD, obesity, prediabetes, vitamin D deficiency, CKD, OSA- c-pap, gout. eFR 65 06/2019  Weight hx: 178 lbs 08/26/2018 196 lbs 04/2019 She has taken herself off salt, sugar, and does not eat meat often.  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  Preferred Learning Style:   No preference indicated   Learning Readiness:   Ready  Change in progress   MEDICATIONS: see list to include calcium/vitamin D, statin   DIETARY INTAKE:  Usual eating pattern includes 2 meals and 2 snacks per day.  Everyday foods include 3-4 bananas per day.  Avoided foods include salt, sugar, little meat.    24-hr recall:  B (8-11 AM): oatmeal, banana  Snk ( AM): almonds, walnuts L ( PM): often skips Snk ( PM): almonds, walnuts D ( PM): vegetable, potatoes or beans, chicken or rare fish Snk ( PM): almonds, walnuts Beverages: water  Usual physical activity: just started in a Wellness program at the [facility] 2 x per week for 75 minutes   Progress Towards Goal(s):  In progress.   Nutritional Diagnosis:  NB-1.1 Food and nutrition-related knowledge deficit As related to nutrition for HTN, hyperlipidemia.  As evidenced by patient report.    Intervention:  Nutrition education regarding nutrition to treat, HTN, hyperlipidemia and prevent diabetes as well as prevent progression of CKD.  Discussed benefits of an increased plant based diet which is low in sodium.  Encouraged regular exercise.  Plan: Continue the [facility] program or walking most days.  Continue a low sodium diet Increased fruits and vegetables Choose whole grains (brown rice,  quinoa, etc)  Flax egg (3 tablespoons water,1 tablespoons ground flax seeds) Studies show that ground flaxseeds can improve blood pressure.  Start with 2 Tablespoons and increase to 4 Tablespoons.  Nutrition Facts.org-  Dr. Alden Benjamin "How not to die cookbook" Engine 2 Diet "Rip Esselstyn" Plant Strong Podcast Emily Filbert you tube cooking shows  Add Vitamin B-12 (disolvable/sublingual)  if you are not eating meat.    Teaching Method Utilized:  Visual Auditory  Handouts given during visit include:  Label reading  Cholesterol and triglycerides  Healthy fats  Low purine diet  Barriers to learning/adherence to lifestyle change: none  Demonstrated degree of understanding via:  Teach Back   Monitoring/Evaluation:  Dietary intake, exercise, label reading, and body weight prn.

## 2019-08-26 NOTE — Patient Instructions (Addendum)
Continue the [employer] program or walking most days.  Continue a low sodium diet Increased fruits and vegetables Choose whole grains (brown rice, quinoa, etc)  Flax egg (3 tablespoons water,1 tablespoons ground flax seeds) Studies show that ground flaxseeds can improve blood pressure.  Start with 2 Tablespoons and increase to 4 Tablespoons.   Nutrition Facts.org-  Dr. Alden Benjamin "How not to die cookbook" Engine [employer] Diet "Rip Esselstyn" Plant Strong Podcast Emily Filbert you tube cooking shows  Add Vitamin B-12 (disolvable/sublingual)  if you are not eating meat.

## 2019-08-29 NOTE — Progress Notes (Signed)
[facility] PREP Weekly Session   Patient Details  Name: ALYSEN MOUDY MRN: [id number] Date of Birth: Jul 10, 1950 Age: 69 y.o. PCP: [name], MD  Vitals:   08/29/19 1621  BP: 135/70  Pulse: 74  Weight: 176 lb 9.6 oz (80.1 kg)    [name] Weekly seesion - 08/29/19 1600      Weekly Session   Topic Discussed  Importance of resistance training;Other ways to be active   initial talk about sodium   Minutes exercised this week  75 minutes    Classes attended to date  3      Fun things since last meeting: family gathering-Mother's Day Things you are grateful for: that I can walk without lots of pain in my feet so I can participate in this program. Nutrition celebration: I normally eat 3-4 bananas to try to satisfy my sweet tooth. Only had 1 yesterday and did not eat one today Barriers/struggles: sugar/salt  Barnett Hatter 08/29/2019, 4:22 PM

## 2019-08-31 NOTE — Progress Notes (Signed)
Reported her BP has been elevated. Today 164/77 at home. Diastolic usually is in the 90's on Sunday it was 100. Reminded to check BP 2x day.  Checked before class: 150/90 Pulse 63  No symptoms. Weight stable.  States taken off diuretic in Jan/Feb and BP has been elevated since  Takes BP meds. Atenolol and Irbesartan.  Will send note over to Md-cards (referral) office.  Hasn't seen PCP had last appt cancelled.  Needs to sched new one.  Exercised gently today with minimal strength training. Taught to breathe with exertion and not to hold weights tightly.

## 2019-09-01 ENCOUNTER — Telehealth: Payer: Self-pay | Admitting: *Deleted

## 2019-09-01 NOTE — Telephone Encounter (Signed)
-----   Message from Skeet Latch, MD sent at 08/31/2019 11:19 PM EDT ----- Regarding: RE: BP running a bit high Thanks Hayden Pedro, please have her add hydralazine 25mg  bid.   Thanks! Tiffany ----- Message ----- From: Germain Osgood, RN Sent: 08/31/2019   3:56 PM EDT To: Skeet Latch, MD Subject: BP running a bit high                          Hey  Working with Ms Debeer today. She reported her bp at 164/70 at home. Here 150/90  No symptoms, no weight change. She commented her BP Sunday at home was 180/100. Reminded her to track her BP and f/u with her PCP-do you want to see her or have her check in with Erasmo Downer? She tells me she is taking her meds.  We exercised gently today.

## 2019-09-01 NOTE — Telephone Encounter (Signed)
Left message to call back  

## 2019-09-02 MED ORDER — HYDRALAZINE HCL 25 MG PO TABS: 25.0000 mg | ORAL_TABLET | Freq: Two times a day (BID) | ORAL | 6 refills | Status: DC

## 2019-09-02 NOTE — Telephone Encounter (Signed)
Routed call to triage

## 2019-09-02 NOTE — Telephone Encounter (Signed)
Spoke to patient.  informed patient of Dr Oval Linsey recommendation to start Hydralazine 25 mg twice a day . This will be addition to her other medications.  patient verbalized understanding .   Medication e-sent to pharmacy    30 day supply with 6 refills #60 .

## 2019-09-05 ENCOUNTER — Telehealth: Payer: Medicare Other | Admitting: Obstetrics & Gynecology

## 2019-09-05 NOTE — Progress Notes (Signed)
[facility] PRE[facility] Weekly Session   Patient Details  Name: Christine Steele MRN: [id number] Date of Birth: 06/06/1950 Age: 69 y.o. PCP: [name], MD  Vitals:   09/05/19 1629  BP: (!) 152/82  Pulse: (!) 55    [name] Weekly seesion - 09/05/19 1600      Weekly Session   Topic Discussed  Health habits    Minutes exercised this week  170 minutes    Classes attended to date  5      Grateful for: for this wellness program to help me to better take care of my health Nutrition celebration: no salt/no sugar  Barriers/struggles: doing the flexibility  Barnett Hatter 09/05/2019, 4:30 PM

## 2019-09-09 ENCOUNTER — Other Ambulatory Visit (HOSPITAL_COMMUNITY): Payer: Medicare Other

## 2019-09-12 NOTE — Progress Notes (Signed)
[facility] PREP Weekly Session   Patient Details  Name: Christine Steele MRN: [id number] Date of Birth: 05-07-1950 Age: 69 y.o. PCP: [name], MD  Vitals:   09/12/19 1556  BP: 111/66  Pulse: 71    [name] Weekly seesion - 09/12/19 1500      Weekly Session   Topic Discussed  Health habits    Minutes exercised this week  280 minutes    Classes attended to date  51      Fun things since last meeting: met with friends I haven't seen since before the pandemic Grateful for: Moving better without being stiff  Nutrition celebration: did well with my foods Barriers/struggles: trying to prepare my melas because I do it all from fresh foods  Feels better with addition of BP med. Talked about food prep and using frozen veggies to ease food prep   Barnett Hatter 09/12/2019, 3:58 PM

## 2019-09-20 ENCOUNTER — Encounter (HOSPITAL_COMMUNITY): Payer: Self-pay

## 2019-09-20 ENCOUNTER — Other Ambulatory Visit: Payer: Self-pay

## 2019-09-20 ENCOUNTER — Ambulatory Visit (HOSPITAL_COMMUNITY)
Admission: EM | Admit: 2019-09-20 | Discharge: 2019-09-20 | Disposition: A | Discharge status: Home / Self Care | Payer: Medicare Other | Attending: Physician Assistant | Admitting: Physician Assistant

## 2019-09-20 DIAGNOSIS — I129 Hypertensive chronic kidney disease with stage 1 through stage 4 chronic kidney disease, or unspecified chronic kidney disease: Secondary | ICD-10-CM | POA: Diagnosis not present

## 2019-09-20 DIAGNOSIS — R509 Fever, unspecified: Secondary | ICD-10-CM | POA: Diagnosis present

## 2019-09-20 DIAGNOSIS — Z87891 Personal history of nicotine dependence: Secondary | ICD-10-CM | POA: Insufficient documentation

## 2019-09-20 DIAGNOSIS — B349 Viral infection, unspecified: Secondary | ICD-10-CM | POA: Diagnosis not present

## 2019-09-20 DIAGNOSIS — Z20822 Contact with and (suspected) exposure to covid-19: Secondary | ICD-10-CM | POA: Insufficient documentation

## 2019-09-20 DIAGNOSIS — G4733 Obstructive sleep apnea (adult) (pediatric): Secondary | ICD-10-CM | POA: Diagnosis not present

## 2019-09-20 DIAGNOSIS — Z79899 Other long term (current) drug therapy: Secondary | ICD-10-CM | POA: Diagnosis not present

## 2019-09-20 DIAGNOSIS — N189 Chronic kidney disease, unspecified: Secondary | ICD-10-CM | POA: Diagnosis not present

## 2019-09-20 DIAGNOSIS — Z7982 Long term (current) use of aspirin: Secondary | ICD-10-CM | POA: Diagnosis not present

## 2019-09-20 NOTE — ED Provider Notes (Signed)
[facility]    CSN: [id number] Arrival date & time: 09/20/19  1456      History   Chief Complaint Chief Complaint  Patient presents with  . Fever  . Blood Pressure Check    HPI Christine Steele is a 69 y.o. female.   Patient reports for evaluation of nighttime fevers, chills elevated blood pressure readings in the evening.  She reports for the last 3 days she has been running temperatures up to 101 in the evening.  She has had some headaches associated with this.  She reports these only occur at night and her temperature is normal during the day.  She reports she has been taking Tylenol in the evening this been helping.  She reports she will have chills and then be hot depending on time at night.  She has been sweating some at night as well.  She denies cough, shortness of breath, sore throat, abdominal pain, nausea, vomiting, diarrhea.  She has had some ear fullness and nasal congestion at baseline.  Denies ear pain.  Her headache is not present during the day.  The headache responds well to Tylenol in the evening.  She has had no changes in her urine to include no painful urination, frequency of urination or urgency of urination.  She also reports she has had blood pressures up in the 160s in the evenings for the last 3 nights.  She reports these are normal during the day.     Past Medical History:  Diagnosis Date  . Aneurysm (HCC)    59mm Right MCA  . Atypical chest pain 03/31/2018  . Cataract    Bilateral  . Chronic kidney disease    Dr. Shelia Media (PCP)   . Hypertension   . OSA (obstructive sleep apnea) 03/31/2018  . OSA on CPAP    havent used CPAP in long time per patient due to unable to get filters to change out  . Osteopenia 01/2017   T score -1.4 FRAX 3.4% / 0.4%  . Potassium (K) deficiency    no longer taking medication for potassium MD no longer prescribed  . Tachycardia   . Vitamin D deficiency     Patient Active Problem List   Diagnosis Date Noted  .  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 OSA (obstructive sleep apnea) 03/31/2018  . Atypical chest pain 03/31/2018  . Weakness 05/26/2015  . HYPERLIPIDEMIA 04/26/2007  . ANEMIA-IRON DEFICIENCY 04/26/2007  . ANXIETY 04/26/2007  . GERD 04/26/2007  . LEG PAIN, RIGHT 04/26/2007  . ANEMIA 02/10/2007  . HYPERTENSION 02/10/2007  . ALLERGY 02/10/2007    Past Surgical History:  Procedure Laterality Date  . BIOPSY BREAST  1970   Right Breast  . COLONOSCOPY    . DILATATION & CURETTAGE/HYSTEROSCOPY WITH MYOSURE N/A 03/06/2017   Procedure: DILATATION & CURETTAGE/HYSTEROSCOPY WITH MYOSURE;  Surgeon: [name], MD;  Location: [facility];  Service: Gynecology;  Laterality: N/A;  Request 7:30am in [facility] block time  requests one hour  . right lumpectomy     benign    OB History    Gravida  2   Para  1   Term      Preterm      AB  1   Living  1     SAB  1   TAB      Ectopic      Multiple      Live Births  Home Medications    Prior to Admission medications   Medication Sig Start Date End Date Taking? Authorizing Provider  Ascorbic Acid (VITAMIN C PO) Take by mouth daily.    [provider]  aspirin EC 325 MG tablet Take 325 mg by mouth every other day.    [provider]  aspirin EC 81 MG tablet Take 81 mg by mouth daily.    [provider]  atenolol (TENORMIN) 25 MG tablet Take 25 mg by mouth daily.     [provider]  calcium-vitamin D 250-100 MG-UNIT tablet Take 1 tablet by mouth 2 (two) times daily.    [provider]  Cholecalciferol (VITAMIN D PO) Take 1,000 Units by mouth.     [provider]  hydrALAZINE (APRESOLINE) 25 MG tablet Take 1 tablet (25 mg total) by mouth 2 (two) times daily. 09/02/19 12/01/19  Skeet Latch, MD  irbesartan (AVAPRO) 300 MG tablet Take 300 mg by mouth daily.    [provider]  Multiple Vitamin (MULTIVITAMIN WITH MINERALS) TABS tablet Take 1 tablet by mouth daily.    [provider]  rosuvastatin (CRESTOR) 20 MG tablet Take 1 tablet (20 mg total) by mouth daily. 07/15/19   Skeet Latch, MD  Turmeric 500 MG CAPS Take by mouth. Occasionally. Also has ginger included.    [provider]    Family History Family History  Problem Relation Age of Onset  . Hypertension Mother   . Heart attack Father   . Stroke Father   . Aneurysm Neg Hx     Social History Social History   Tobacco Use  . Smoking status: Former Smoker    Packs/day: 1.00    Years: 5.00    Pack years: 5.00    Types: Cigarettes    Quit date: 04/22/1979    Years since quitting: 40.4  . Smokeless tobacco: Never Used  Substance Use Topics  . Alcohol use: No    Alcohol/week: 0.0 standard drinks  . Drug use: No     Allergies   Amlodipine, Chocolate, Coconut fatty acids, Other, and Peanut-containing drug products   Review of Systems Review of Systems   Physical Exam Triage Vital Signs ED Triage Vitals  Enc Vitals Group     BP 09/20/19 1515 127/69     Pulse Rate 09/20/19 1515 78     Resp 09/20/19 1515 18     Temp 09/20/19 1515 98.4 F (36.9 C)     Temp Source 09/20/19 1515 Oral     SpO2 09/20/19 1515 98 %     Weight --      Height --      Head Circumference --      Peak Flow --      Pain Score 09/20/19 1518 0     Pain Loc --      Pain Edu? --      Excl. in [place]? --    No data found.  Updated Vital Signs BP 127/69 (BP Location: Right Arm)   Pulse 78   Temp 98.4 F (36.9 C) (Oral)   Resp 18   SpO2 98%   Visual Acuity Right Eye Distance:   Left Eye Distance:   Bilateral Distance:    Right Eye Near:   Left Eye Near:    Bilateral Near:     Physical Exam Vitals and nursing note reviewed.  Constitutional:      General: She is not in acute distress.    Appearance:  Normal appearance. She is well-developed. She is not ill-appearing.   HENT:     Head: Normocephalic and atraumatic.     Right Ear: Tympanic membrane normal.     Left Ear: Tympanic membrane normal.     Nose:     Comments: Pale boggy turbinates bilaterally clear rhinorrhea    Mouth/Throat:     Mouth: Mucous membranes are moist.     Comments: Mild postnasal drip Eyes:     Extraocular Movements: Extraocular movements intact.     Conjunctiva/sclera: Conjunctivae normal.     Pupils: Pupils are equal, round, and reactive to light.  Cardiovascular:     Rate and Rhythm: Normal rate and regular rhythm.     Heart sounds: No murmur.  Pulmonary:     Effort: Pulmonary effort is normal. No respiratory distress.     Breath sounds: Normal breath sounds. No wheezing, rhonchi or rales.  Abdominal:     Palpations: Abdomen is soft.     Tenderness: There is no abdominal tenderness. There is no right CVA tenderness.  Musculoskeletal:     Cervical back: Neck supple. No tenderness.     Right lower leg: No edema.     Left lower leg: No edema.  Lymphadenopathy:     Cervical: No cervical adenopathy.  Skin:    General: Skin is warm and dry.  Neurological:     General: No focal deficit present.     Mental Status: She is alert and oriented to person, place, and time.      UC Treatments / Results  Labs (all labs ordered are listed, but only abnormal results are displayed) Labs Reviewed  SARS CORONAVIRUS 2 (TAT 6-24 HRS)    EKG   Radiology No results found.  Procedures Procedures (including critical care time)  Medications Ordered in UC Medications - No data to display  Initial Impression / Assessment and Plan / UC Course  I have reviewed the triage vital signs and the nursing notes.  Pertinent labs & imaging results that were available during my care of the patient were reviewed by me and considered in my medical decision making (see chart for details).     #Viral illness Patient is a 69 year old presenting with what appears to be a viral syndrome.   She is well-appearing in clinic today with normal vital signs and afebrile.  She has benign exam with the exception of some nasal congestion.  No obvious sign of an infection.  She completed Covid vaccine in a row we will test for Covid here.  Discussed strict follow-up and emergency department precautions.  We will have her follow-up primary care to discuss blood pressure and nighttime symptoms if they persist.  Patient verbalized understanding the plan. Final Clinical Impressions(s) / UC Diagnoses   Final diagnoses:  Viral illness     Discharge Instructions     Continue taking Tylenol in the evening.  Or when having headache or fever We have sent a Covid test, we will notify you of any positive results otherwise is been your MyChart.  If you develop high fever of 103, shortness of breath or other severe symptoms please return to this clinic or report to the emergency department.  When she is scheduled follow-up with your primary care to discuss your blood pressure and the symptoms in the evening.  If your Covid-19 test is positive, you will receive a phone call from [facility] regarding your results. Negative test results are not called. Both positive and negative results  area always visible on MyChart. If you do not have a MyChart account, sign up instructions are in your discharge papers.   Persons who are directed to care for themselves at home may discontinue isolation under the following conditions:  . At least 10 days have passed since symptom onset and . At least 24 hours have passed without running a fever (this means without the use of fever-reducing medications) and . Other symptoms have improved.  Persons infected with COVID-19 who never develop symptoms may discontinue isolation and other precautions 10 days after the date of their first positive COVID-19 test.      ED Prescriptions    None     PDMP not reviewed this encounter.   Purnell Shoemaker, PA-C 09/20/19  [phone]

## 2019-09-20 NOTE — Discharge Instructions (Signed)
Continue taking Tylenol in the evening.  Or when having headache or fever We have sent a Covid test, we will notify you of any positive results otherwise is been your MyChart.  If you develop high fever of 103, shortness of breath or other severe symptoms please return to this clinic or report to the emergency department.  When she is scheduled follow-up with your primary care to discuss your blood pressure and the symptoms in the evening.  If your Covid-19 test is positive, you will receive a phone call from [facility] regarding your results. Negative test results are not called. Both positive and negative results area always visible on MyChart. If you do not have a MyChart account, sign up instructions are in your discharge papers.   Persons who are directed to care for themselves at home may discontinue isolation under the following conditions:   At least 10 days have passed since symptom onset and  At least 24 hours have passed without running a fever (this means without the use of fever-reducing medications) and  Other symptoms have improved.  Persons infected with COVID-19 who never develop symptoms may discontinue isolation and other precautions 10 days after the date of their first positive COVID-19 test.

## 2019-09-20 NOTE — ED Triage Notes (Signed)
Pt reports fever, night sweats and high blood pressure at night (168/101 mmHg) for the past 3 days. Pt denies chest pain.

## 2019-09-21 ENCOUNTER — Telehealth: Payer: Self-pay | Admitting: Cardiovascular Disease

## 2019-09-21 ENCOUNTER — Telehealth: Payer: Self-pay

## 2019-09-21 LAB — SARS CORONAVIRUS 2 (TAT 6-24 HRS): SARS Coronavirus 2: NEGATIVE

## 2019-09-21 NOTE — Telephone Encounter (Signed)
Spoke with patient and advised to call PCP, patient verbalized understanding

## 2019-09-21 NOTE — Telephone Encounter (Signed)
Pt c/o medication issue:  1. Name of Medication: hydrALAZINE (APRESOLINE) 25 MG tablet  2. How are you currently taking this medication (dosage and times per day)? As directed  3. Are you having a reaction (difficulty breathing--STAT)? Possibly  4. What is your medication issue? Patient states that she may be having some side effects to this medication. She states her HR was 109 this morning and her BP was around 113/70. Her heart rate did decrease throughout the morning to 96 but she states this is still high for her. Yesterday her BP was 103/65. Patient states that she has been having chills and a sore throat. She got tested for covid but it was negative. Please advise.

## 2019-09-21 NOTE — Telephone Encounter (Signed)
Call from patient reporting high HR (100's) over the last few days and temp (101) in the evening. BP also elevated in evenings.  Went to urgent care yesterday, covid neg. Wondering if some of the medication changes could be the cause.  Asked to reach out to Dr Quintella Reichert office for guidance. Will take a break from PREP today until advice from [place].

## 2019-09-22 DIAGNOSIS — M25562 Pain in left knee: Secondary | ICD-10-CM | POA: Diagnosis not present

## 2019-09-22 DIAGNOSIS — M064 Inflammatory polyarthropathy: Secondary | ICD-10-CM | POA: Diagnosis not present

## 2019-09-22 DIAGNOSIS — M79671 Pain in right foot: Secondary | ICD-10-CM | POA: Diagnosis not present

## 2019-09-22 DIAGNOSIS — M1990 Unspecified osteoarthritis, unspecified site: Secondary | ICD-10-CM | POA: Diagnosis not present

## 2019-09-22 DIAGNOSIS — I10 Essential (primary) hypertension: Secondary | ICD-10-CM | POA: Diagnosis not present

## 2019-09-22 DIAGNOSIS — N189 Chronic kidney disease, unspecified: Secondary | ICD-10-CM | POA: Diagnosis not present

## 2019-09-22 DIAGNOSIS — M25561 Pain in right knee: Secondary | ICD-10-CM | POA: Diagnosis not present

## 2019-09-23 DIAGNOSIS — I10 Essential (primary) hypertension: Secondary | ICD-10-CM | POA: Diagnosis not present

## 2019-09-23 DIAGNOSIS — R509 Fever, unspecified: Secondary | ICD-10-CM | POA: Diagnosis not present

## 2019-09-23 DIAGNOSIS — D72819 Decreased white blood cell count, unspecified: Secondary | ICD-10-CM | POA: Diagnosis not present

## 2019-09-23 DIAGNOSIS — R502 Drug induced fever: Secondary | ICD-10-CM | POA: Diagnosis not present

## 2019-09-23 DIAGNOSIS — M2550 Pain in unspecified joint: Secondary | ICD-10-CM | POA: Diagnosis not present

## 2019-09-23 DIAGNOSIS — R748 Abnormal levels of other serum enzymes: Secondary | ICD-10-CM | POA: Diagnosis not present

## 2019-09-27 ENCOUNTER — Telehealth (INDEPENDENT_AMBULATORY_CARE_PROVIDER_SITE_OTHER): Payer: Medicare Other | Admitting: Cardiovascular Disease

## 2019-09-27 ENCOUNTER — Other Ambulatory Visit: Payer: Self-pay

## 2019-09-27 ENCOUNTER — Encounter: Payer: Self-pay | Admitting: Cardiovascular Disease

## 2019-09-27 ENCOUNTER — Encounter (HOSPITAL_BASED_OUTPATIENT_CLINIC_OR_DEPARTMENT_OTHER): Payer: Self-pay | Admitting: Obstetrics & Gynecology

## 2019-09-27 VITALS — BP 104/63 | HR 68 | Ht 63.0 in | Wt 165.0 lb

## 2019-09-27 DIAGNOSIS — K219 Gastro-esophageal reflux disease without esophagitis: Secondary | ICD-10-CM | POA: Diagnosis not present

## 2019-09-27 DIAGNOSIS — D72819 Decreased white blood cell count, unspecified: Secondary | ICD-10-CM | POA: Diagnosis not present

## 2019-09-27 DIAGNOSIS — I10 Essential (primary) hypertension: Secondary | ICD-10-CM | POA: Diagnosis not present

## 2019-09-27 DIAGNOSIS — G4733 Obstructive sleep apnea (adult) (pediatric): Secondary | ICD-10-CM

## 2019-09-27 DIAGNOSIS — R748 Abnormal levels of other serum enzymes: Secondary | ICD-10-CM | POA: Diagnosis not present

## 2019-09-27 MED ORDER — ATENOLOL 25 MG PO TABS: 37.5000 mg | ORAL_TABLET | Freq: Every day | ORAL | 3 refills | Status: DC

## 2019-09-27 NOTE — Progress Notes (Addendum)
Addendum: spoke with [place] zanetto pa patient meets [facility] guidelines per jessica zanetto pa.    Spoke w/ via phone for pre-op interview---patient Lab needs dos---- none             Lab results------has lab appt 09-29-2019 at 1330 pm for cbc, bmet COVID test ------09-29-2019 at 230 pm Arrive at -------900 am 10-03-2019 NPO after ------midnight Medications to take morning of surgery -----atenolol, rosuvastatin Diabetic medication -----n/a Patient Special Instructions -----bring cpap mask tubing and machine and leave in car Pre-Op special Istructions -----ask dr Holley Dexter about stopping 81 mg aspirin for surgery Patient verbalized understanding of instructions that were given at this phone interview. Patient denies shortness of breath, chest pain, fever, cough a this phone interview.  Anesthesia : right mca aneurysm,  osa, htn Chart to jessica zanetto pa for review  PCP:dr walter pharr Cardiologist :dr [place]ley [place] dr Oval Linsey 05-04-2019 epic Chest x-ray :none Ct coronary 07-11-2019 epic Ct angio head 07-08-2019 epic Stress myoview 04-02-2018 epic EKG :05-04-2019 epic Echo :11-14-2009 epic Cardiac Cath : none Sleep Study 05-17-2018 epic, pt does not know cpap settings Fasting Blood Sugar :      / Checks Blood Sugar -- times a day: n/a  Blood Thinner/ Instructions /Last Dose:n/a ASA / Instructions/ Last Dose : patient to ask dr Holley Dexter about stopping 81 mg aspirin for surgery  Patient denies shortness of breath, chest pain, fever, and cough at this phone interview.

## 2019-09-27 NOTE — Patient Instructions (Addendum)
Medication Instructions:  DECREASE YOUR ATENOLOL TO 37.5MG   STOP YOUR HYDRALAZINE  If you need a refill on your cardiac medications before your next appointment, please call your pharmacy*   Lab Work: FASTING LABS: LIPID CMET  If you have labs (blood work) drawn today and your tests are completely normal, you will receive your results only by: Marland Kitchen MyChart Message (if you have MyChart) OR . A paper copy in the mail If you have any lab test that is abnormal or we need to change your treatment, we will call you to review the results.  Follow-Up: At [facility], you and your health needs are our priority.  As part of our continuing mission to provide you with exceptional heart care, we have created designated Provider Care Teams.  These Care Teams include your primary Cardiologist (physician) and Advanced Practice Providers (APPs -  Physician Assistants and Nurse Practitioners) who all work together to provide you with the care you need, when you need it.  We recommend signing up for the patient portal called "MyChart".  Sign up information is provided on this After Visit Summary.  MyChart is used to connect with patients for Virtual Visits (Telemedicine).  Patients are able to view lab/test results, encounter notes, upcoming appointments, etc.  Non-urgent messages can be sent to your provider as well.   To learn more about what you can do with MyChart, go to [email or web address].    Your next appointment:   2 month(s)  The format for your next appointment:   In Person  Provider:   Skeet Latch, MD

## 2019-09-27 NOTE — Progress Notes (Signed)
Virtual Visit via Video Note   This visit type was conducted due to national recommendations for restrictions regarding the COVID-19 Pandemic (e.g. social distancing) in an effort to limit this patient's exposure and mitigate transmission in our community.  Due to her co-morbid illnesses, this patient is at least at moderate risk for complications without adequate follow up.  This format is felt to be most appropriate for this patient at this time.  All issues noted in this document were discussed and addressed.  A limited physical exam was performed with this format.  Please refer to the patient's chart for her consent to telehealth for [facility].   The patient was identified using 2 identifiers.  Date:  09/27/2019   ID:  Christine Steele, DOB October 08, 1950, MRN [id number]  Patient Location: Home Provider Location: Office  PCP:  [name], MD  Cardiologist:  Skeet Latch, MD  Electrophysiologist:  None   Evaluation Performed:  Follow-Up Visit  Chief Complaint:  hypertension  History of Present Illness:    Christine Steele is a 69 y.o. female with moderate CAD, hypertension, brain aneurysm, and OSA on CPAP here for follow up.  She was initially seen 03/2018 for chest tightness.  Ms. Debruler reported episodes of chest discomfort associated with L arm weakness.  In general she has been feeling weak and tired.  The chest discomfort comes and goes.  She first noticed it after she switched from bisoprolol to atenolol.  The pain is up to 8/10 in severity and is not associated with shortness of breath, nausea or diaphoresis.  She was referred for a Lexiscan Myoview 03/2018 that was negative for ischemia.  She continued to have atypical symptoms and had a coronary CT-A 06/2019 that showed moderate LAD disease.  Calcium score was 98th percentile.    [name] has been experiencing spells of feeling weak and then radiating into her chest and left side.  Her PCP stopped HCTZ to see if that helped.   She is unsure whether this helped.  Her BP became elevated.  Losartan was switched to irbesartan.  Since her last appointment she was seen in the ED 09/20/2019.  She reported systolic blood pressures in the 160s.  She has been participating in the PREP program to the [facility].  Her blood pressures are noted to be high there as well.  Hydralazine was added to her regimen 08/2019.  At first it didn't seem to help.  Lately her BP has been better.  However she then started feeling very tired and febrile.  Her BP was elevated at night and then her BP and heart rate went up.  She went to Urgent Care and was negative for COVID.  She followed up with Dr. Shelia Media and hydralazine was stopped.  Atenolol was increased to 50mg .  Now her BP has been running low from 80s-110s/50-60s.  She is feeling better but tired.  She has no chest pain and her breathing is stable.    The patient does not have symptoms concerning for COVID-19 infection (fever, chills, cough, or new shortness of breath).    Past Medical History:  Diagnosis Date  . Aneurysm (HCC)    25mm Right MCA  . Atypical chest pain 03/31/2018  . Cataract    Bilateral  . Chronic kidney disease    Dr. Shelia Media (PCP)   . Hypertension   . OSA (obstructive sleep apnea) 03/31/2018  . OSA on CPAP    havent used CPAP in long time per  patient due to unable to get filters to change out  . Osteopenia 01/2017   T score -1.4 FRAX 3.4% / 0.4%  . Potassium (K) deficiency    no longer taking medication for potassium MD no longer prescribed  . Tachycardia   . Vitamin D deficiency     Past Surgical History:  Procedure Laterality Date  . BIOPSY BREAST  1970   Right Breast  . COLONOSCOPY    . DILATATION & CURETTAGE/HYSTEROSCOPY WITH MYOSURE N/A 03/06/2017   Procedure: DILATATION & CURETTAGE/HYSTEROSCOPY WITH MYOSURE;  Surgeon: [name], MD;  Location: [place];  Service: Gynecology;  Laterality: N/A;  Request 7:30am in [facility] block  time  requests one hour  . right lumpectomy     benign     Current Outpatient Medications  Medication Sig Dispense Refill  . Ascorbic Acid (VITAMIN C PO) Take by mouth daily.    Marland Kitchen aspirin EC 325 MG tablet Take 325 mg by mouth every other day.    . calcium-vitamin D 250-100 MG-UNIT tablet Take 1 tablet by mouth 2 (two) times daily.    . irbesartan (AVAPRO) 300 MG tablet Take 300 mg by mouth daily.    . Multiple Vitamin (MULTIVITAMIN WITH MINERALS) TABS tablet Take 1 tablet by mouth daily.    . rosuvastatin (CRESTOR) 20 MG tablet Take 1 tablet (20 mg total) by mouth daily. 90 tablet 1  . aspirin EC 81 MG tablet Take 81 mg by mouth daily.    Marland Kitchen atenolol (TENORMIN) 25 MG tablet Take 1.5 tablets (37.5 mg total) by mouth daily. 135 tablet 3  . Cholecalciferol (VITAMIN D PO) Take 1,000 Units by mouth.     . Turmeric 500 MG CAPS Take by mouth. Occasionally. Also has ginger included.     No current facility-administered medications for this visit.    Allergies:   Amlodipine, Chocolate, Coconut fatty acids, Other, and Peanut-containing drug products    Social History:  The patient  reports that she quit smoking about 40 years ago. Her smoking use included cigarettes. She has a 5.00 pack-year smoking history. She has never used smokeless tobacco. She reports that she does not drink alcohol or use drugs.   Family History:  The patient's family history includes Heart attack in her father; Hypertension in her mother; Stroke in her father.    ROS:  Please see the history of present illness.   Otherwise, review of systems are positive for none.   All other systems are reviewed and negative.    PHYSICAL EXAM: BP 104/63   Pulse 68   Ht 5\' 3"  (1.6 m)   Wt 165 lb (74.8 kg)   BMI 29.23 kg/m  GENERAL: Well-appearing.  No acute distress. HEENT: Pupils equal round.  Oral mucosa unremarkable NECK:  No jugular venous distention, no visible thyromegaly EXT:  No edema, no cyanosis no clubbing SKIN:   No rashes no nodules NEURO:  Speech fluent.  Cranial nerves grossly intact.  Moves all 4 extremities freely PSYCH:  Cognitively intact, oriented to person place and time   EKG:  EKG is not ordered today. 05/04/19: Sinus bradycardia.  Rate 57 bpm.   Lexiscan Myoview  04/02/18:  Nuclear stress EF: 52%. The left ventricular ejection fraction is mildly decreased (45-54%).  T wave inversion was noted during stress in the V4, V5, aVF and V3 leads.  This is a low risk study. No evidence of ischemia. No previous MI  The study is normal.  Recent Labs: 07/04/2019: BUN 13; Creatinine, Ser 1.03; Potassium 4.4; Sodium 141    Lipid Panel    Component Value Date/Time   CHOL  10/28/2009 0455    153        ATP III CLASSIFICATION:  <200     mg/dL   Desirable  200-239  mg/dL   Borderline High  >=240    mg/dL   High          TRIG 53 10/28/2009 0455   HDL 60 10/28/2009 0455   CHOLHDL 2.6 10/28/2009 0455   VLDL 11 10/28/2009 0455   LDLCALC  10/28/2009 0455    82        Total Cholesterol/HDL:CHD Risk Coronary Heart Disease Risk Table                     Men   Women  1/2 Average Risk   3.4   3.3  Average Risk       5.0   4.4  2 X Average Risk   9.6   7.1  3 X Average Risk  23.4   11.0        Use the calculated Patient Ratio above and the CHD Risk Table to determine the patient's CHD Risk.        ATP III CLASSIFICATION (LDL):  <100     mg/dL   Optimal  100-129  mg/dL   Near or Above                    Optimal  130-159  mg/dL   Borderline  160-189  mg/dL   High  >190     mg/dL   Very High   LDLDIRECT 121.6 10/13/2006 1729      Wt Readings from Last 3 Encounters:  09/27/19 165 lb (74.8 kg)  09/12/19 171 lb 9.6 oz (77.8 kg)  08/29/19 176 lb 9.6 oz (80.1 kg)      ASSESSMENT AND PLAN:  # Moderate CAD:  # Hyperlipidemia: Lexiscan Myoview was negative for ischemia. She continued to have atypical symptoms and had a coronary CT-A 06/2019 that showed moderate LAD disease.   Calcium score was 98th percentile.  Continue aspirin and rosuvastaitn.  She will come for fasting lipids/CMP.  LDL goal <70.  Continue exercising with the PREP program.  # OSA: Encouraged continued CPAP usage.  # Hypertension: BP labile.  Reduce atenolol to 37.5mg .  Continue irbesartan.  # Brain aneurysm: On aspirin 325mg  per neurology.  COVID-19 Education: The signs and symptoms of COVID-19 were discussed with the patient and how to seek care for testing (follow up with PCP or arrange E-visit).  The importance of social distancing was discussed today.  Time:   Today, I have spent 22 minutes with the patient with telehealth technology discussing the above problems.    Current medicines are reviewed at length with the patient today.  The patient does not have concerns regarding medicines.  The following changes have been made:  Reduced atenolol to 37.5mg  daily.  Labs/ tests ordered today include:   No orders of the defined types were placed in this encounter.    Disposition:   FU with Thiago Ragsdale C. Oval Linsey, MD, [facility] in 2  months   Signed, Keaja Reaume C. Oval Linsey, MD, [facility]  09/27/2019 10:28 AM    [facility]

## 2019-09-28 ENCOUNTER — Telehealth (INDEPENDENT_AMBULATORY_CARE_PROVIDER_SITE_OTHER): Payer: Medicare Other | Admitting: Obstetrics & Gynecology

## 2019-09-28 ENCOUNTER — Encounter: Payer: Self-pay | Admitting: Obstetrics & Gynecology

## 2019-09-28 DIAGNOSIS — R9389 Abnormal findings on diagnostic imaging of other specified body structures: Secondary | ICD-10-CM | POA: Diagnosis not present

## 2019-09-28 DIAGNOSIS — N950 Postmenopausal bleeding: Secondary | ICD-10-CM | POA: Diagnosis not present

## 2019-09-28 DIAGNOSIS — I10 Essential (primary) hypertension: Secondary | ICD-10-CM

## 2019-09-28 NOTE — Progress Notes (Signed)
Christine Steele 1950/08/03 [id number]        69 y.o.  G2P0011 Televisit after verbal consent.  Patient well identified.  Video visit conducted between patient at her home and myself in my [place] office.  Preop counseling on HSC/Myosure Excision/D+C.  RP: Preop HSC/Myosure Excision/D+C on 10/03/2019  HPI: PMB x 1 week with cramping in early April 2021. Abstinent x many months. Postmenopause on no HRT. H/O Endometrial Polyp removed in 2018.    OB History  Gravida Para Term Preterm AB Living  2 1     1 1   SAB TAB Ectopic Multiple Live Births  1            # Outcome Date GA Lbr Len/2nd Weight Sex Delivery Anes PTL Lv  2 SAB           1 Para             Past medical history,surgical history, problem list, medications, allergies, family history and social history were all reviewed and documented in the EPIC chart.   Directed ROS with pertinent positives and negatives documented in the history of present illness/assessment and plan.  Exam:  There were no vitals filed for this visit. General appearance:  Normal  Pelvic US 08/04/2019: T/V images.  Comparison is made with previous scan in 2018.  Enlarged, irregular anteverted uterus with several intramural and subserosal fibroids which are smaller today than on the previous scan.  The uterus is measured at 7.66 x 5.63 x 6.63 cm.  The endometrial lining is thickened with cystic changes, measured at 10.12 mm.  Feeder vessels are identified to the thickened area similar in appearance to the previous scan prior to the hysteroscopy with excision and D&C.  Right ovary small with atrophic appearance.  Left ovary with a 3.0 x 2.2 cm simple cystic structure similar in size and appearance to previous scan.  No free fluid in the posterior cul-de-sac.   Assessment/Plan:  69 y.o. G2P0011   1. Postmenopausal bleeding Postmenopausal bleeding x1 week on no hormone replacement therapy.  History of endometrial polyp excised in 2018.  Pelvic ultrasound  findings reviewed with patient.  The uterus is normal, but the endometrial lining is thickened with cystic changes, measured at 10.12 mm with feeder vessels to the thickened area.  The appearance is similar to the previous scan prior to hysteroscopy and excision with D&C in 2018.  Probable endometrial polyps.  Decision to schedule a hysteroscopy with excision and D&C.  Preop preparation, surgical procedure with risks and benefits as well as postop expectations and precautions thoroughly reviewed with patient.  Patient informed that antibiotics IV will be given to decrease the risk of infection and PAS will be applied on her legs to help circulation and lower the risk of blood clots.  The risk of uterine perforation was discussed.  Patient voiced understanding and agreement with plan.  2. Endometrial thickening on ultrasound As above.  3. Chronic hypertension Seen by Dr Shelia Media.  BP now controled under medication.                         Patient was counseled as to the risk of surgery to include the following:  1. Infection (prohylactic antibiotics will be administered)  2. DVT/Pulmonary Embolism (prophylactic pneumo compression stockings will be used)  3.Trauma to internal organs requiring additional surgical procedure to repair any injury to internal organs requiring perhaps additional hospitalization days.  4.Hemmorhage requiring transfusion  and blood products which carry risks such as anaphylactic reaction, hepatitis and AIDS  Patient had received literature information on the procedure scheduled and all her questions were answered and fully accepts all risk.   [name] MD, 11:38 AM 09/28/2019

## 2019-09-28 NOTE — Patient Instructions (Signed)
1. Postmenopausal bleeding Postmenopausal bleeding x1 week on no hormone replacement therapy.  History of endometrial polyp excised in 2018.  Pelvic ultrasound findings reviewed with patient.  The uterus is normal, but the endometrial lining is thickened with cystic changes, measured at 10.12 mm with feeder vessels to the thickened area.  The appearance is similar to the previous scan prior to hysteroscopy and excision with D&C in 2018.  Probable endometrial polyps.  Decision to schedule a hysteroscopy with excision and D&C.  Preop preparation, surgical procedure with risks and benefits as well as postop expectations and precautions thoroughly reviewed with patient.  Patient informed that antibiotics IV will be given to decrease the risk of infection and PAS will be applied on her legs to help circulation and lower the risk of blood clots.  The risk of uterine perforation was discussed.  Patient voiced understanding and agreement with plan.  2. Endometrial thickening on ultrasound As above.  3. Chronic hypertension Seen by Dr Shelia Media.  BP now controled under medication.                         Patient was counseled as to the risk of surgery to include the following:  1. Infection (prohylactic antibiotics will be administered)  2. DVT/Pulmonary Embolism (prophylactic pneumo compression stockings will be used)  3.Trauma to internal organs requiring additional surgical procedure to repair any injury to internal organs requiring perhaps additional hospitalization days.  4.Hemmorhage requiring transfusion and blood products which carry risks such as anaphylactic reaction, hepatitis and AIDS  Patient had received literature information on the procedure scheduled and all her questions were answered and fully accepts all risks.

## 2019-09-29 ENCOUNTER — Other Ambulatory Visit (HOSPITAL_COMMUNITY)
Admission: RE | Admit: 2019-09-29 | Discharge: 2019-09-29 | Disposition: A | Discharge status: Home / Self Care | Payer: Medicare Other | Source: Ambulatory Visit | Attending: Obstetrics & Gynecology | Admitting: Obstetrics & Gynecology

## 2019-09-29 ENCOUNTER — Other Ambulatory Visit: Payer: Self-pay

## 2019-09-29 ENCOUNTER — Encounter (HOSPITAL_COMMUNITY)
Admission: RE | Admit: 2019-09-29 | Discharge: 2019-09-29 | Disposition: A | Discharge status: Home / Self Care | Payer: Medicare Other | Source: Ambulatory Visit | Attending: Obstetrics & Gynecology | Admitting: Obstetrics & Gynecology

## 2019-09-29 DIAGNOSIS — Z20822 Contact with and (suspected) exposure to covid-19: Secondary | ICD-10-CM | POA: Diagnosis not present

## 2019-09-29 DIAGNOSIS — Z01812 Encounter for preprocedural laboratory examination: Secondary | ICD-10-CM | POA: Insufficient documentation

## 2019-09-29 LAB — CBC
HCT: 37.5 % (ref 36.0–46.0)
Hemoglobin: 11.8 g/dL — ABNORMAL LOW (ref 12.0–15.0)
MCH: 29.4 pg (ref 26.0–34.0)
MCHC: 31.5 g/dL (ref 30.0–36.0)
MCV: 93.3 fL (ref 80.0–100.0)
Platelets: 295 10*3/uL (ref 150–400)
RBC: 4.02 MIL/uL (ref 3.87–5.11)
RDW: 14.2 % (ref 11.5–15.5)
WBC: 5.7 10*3/uL (ref 4.0–10.5)
nRBC: 0 % (ref 0.0–0.2)

## 2019-09-29 LAB — BASIC METABOLIC PANEL
Anion gap: 8 (ref 5–15)
BUN: 17 mg/dL (ref 8–23)
CO2: 26 mmol/L (ref 22–32)
Calcium: 8.7 mg/dL — ABNORMAL LOW (ref 8.9–10.3)
Chloride: 104 mmol/L (ref 98–111)
Creatinine, Ser: 1.04 mg/dL — ABNORMAL HIGH (ref 0.44–1.00)
GFR calc Af Amer: >60 mL/min (ref >60)
GFR calc non Af Amer: 56 mL/min — ABNORMAL LOW (ref >60)
Glucose, Bld: 90 mg/dL (ref 70–99)
Potassium: 4 mmol/L (ref 3.5–5.1)
Sodium: 138 mmol/L (ref 135–145)

## 2019-09-29 LAB — SARS CORONAVIRUS 2 (TAT 6-24 HRS): SARS Coronavirus 2: NEGATIVE

## 2019-10-03 ENCOUNTER — Encounter (HOSPITAL_BASED_OUTPATIENT_CLINIC_OR_DEPARTMENT_OTHER): Payer: Self-pay | Admitting: Obstetrics & Gynecology

## 2019-10-03 ENCOUNTER — Ambulatory Visit (HOSPITAL_BASED_OUTPATIENT_CLINIC_OR_DEPARTMENT_OTHER): Payer: Medicare Other | Admitting: Anesthesiology

## 2019-10-03 ENCOUNTER — Ambulatory Visit (HOSPITAL_BASED_OUTPATIENT_CLINIC_OR_DEPARTMENT_OTHER)
Admission: RE | Admit: 2019-10-03 | Discharge: 2019-10-03 | Disposition: A | Discharge status: Home / Self Care | Payer: Medicare Other | Attending: Obstetrics & Gynecology | Admitting: Obstetrics & Gynecology

## 2019-10-03 ENCOUNTER — Encounter (HOSPITAL_BASED_OUTPATIENT_CLINIC_OR_DEPARTMENT_OTHER)
Admission: RE | Disposition: A | Discharge status: Home / Self Care | Payer: Self-pay | Source: Home / Self Care | Attending: Obstetrics & Gynecology

## 2019-10-03 ENCOUNTER — Ambulatory Visit (HOSPITAL_BASED_OUTPATIENT_CLINIC_OR_DEPARTMENT_OTHER): Payer: Medicare Other | Admitting: Physician Assistant

## 2019-10-03 DIAGNOSIS — M1990 Unspecified osteoarthritis, unspecified site: Secondary | ICD-10-CM | POA: Insufficient documentation

## 2019-10-03 DIAGNOSIS — D649 Anemia, unspecified: Secondary | ICD-10-CM | POA: Diagnosis not present

## 2019-10-03 DIAGNOSIS — N950 Postmenopausal bleeding: Secondary | ICD-10-CM | POA: Diagnosis not present

## 2019-10-03 DIAGNOSIS — K219 Gastro-esophageal reflux disease without esophagitis: Secondary | ICD-10-CM | POA: Insufficient documentation

## 2019-10-03 DIAGNOSIS — Z7982 Long term (current) use of aspirin: Secondary | ICD-10-CM | POA: Diagnosis not present

## 2019-10-03 DIAGNOSIS — F419 Anxiety disorder, unspecified: Secondary | ICD-10-CM | POA: Diagnosis not present

## 2019-10-03 DIAGNOSIS — N189 Chronic kidney disease, unspecified: Secondary | ICD-10-CM | POA: Diagnosis not present

## 2019-10-03 DIAGNOSIS — N840 Polyp of corpus uteri: Secondary | ICD-10-CM | POA: Insufficient documentation

## 2019-10-03 DIAGNOSIS — R9389 Abnormal findings on diagnostic imaging of other specified body structures: Secondary | ICD-10-CM | POA: Diagnosis not present

## 2019-10-03 DIAGNOSIS — M8580 Other specified disorders of bone density and structure, unspecified site: Secondary | ICD-10-CM | POA: Diagnosis not present

## 2019-10-03 DIAGNOSIS — Z87891 Personal history of nicotine dependence: Secondary | ICD-10-CM | POA: Diagnosis not present

## 2019-10-03 DIAGNOSIS — I671 Cerebral aneurysm, nonruptured: Secondary | ICD-10-CM | POA: Diagnosis not present

## 2019-10-03 DIAGNOSIS — Z8249 Family history of ischemic heart disease and other diseases of the circulatory system: Secondary | ICD-10-CM | POA: Insufficient documentation

## 2019-10-03 DIAGNOSIS — Z91018 Allergy to other foods: Secondary | ICD-10-CM | POA: Diagnosis not present

## 2019-10-03 DIAGNOSIS — G4730 Sleep apnea, unspecified: Secondary | ICD-10-CM | POA: Insufficient documentation

## 2019-10-03 DIAGNOSIS — Z823 Family history of stroke: Secondary | ICD-10-CM | POA: Diagnosis not present

## 2019-10-03 DIAGNOSIS — Z888 Allergy status to other drugs, medicaments and biological substances status: Secondary | ICD-10-CM | POA: Insufficient documentation

## 2019-10-03 DIAGNOSIS — Z91010 Allergy to peanuts: Secondary | ICD-10-CM | POA: Diagnosis not present

## 2019-10-03 DIAGNOSIS — I129 Hypertensive chronic kidney disease with stage 1 through stage 4 chronic kidney disease, or unspecified chronic kidney disease: Secondary | ICD-10-CM | POA: Diagnosis not present

## 2019-10-03 DIAGNOSIS — E559 Vitamin D deficiency, unspecified: Secondary | ICD-10-CM | POA: Insufficient documentation

## 2019-10-03 DIAGNOSIS — Z79899 Other long term (current) drug therapy: Secondary | ICD-10-CM | POA: Diagnosis not present

## 2019-10-03 DIAGNOSIS — G4733 Obstructive sleep apnea (adult) (pediatric): Secondary | ICD-10-CM | POA: Insufficient documentation

## 2019-10-03 DIAGNOSIS — N841 Polyp of cervix uteri: Secondary | ICD-10-CM | POA: Insufficient documentation

## 2019-10-03 HISTORY — DX: Unspecified osteoarthritis, unspecified site: M19.90

## 2019-10-03 HISTORY — PX: DILATATION & CURETTAGE/HYSTEROSCOPY WITH MYOSURE: SHX6511

## 2019-10-03 SURGERY — DILATATION & CURETTAGE/HYSTEROSCOPY WITH MYOSURE
Anesthesia: General | Site: Vagina

## 2019-10-03 MED ORDER — PROPOFOL 10 MG/ML IV BOLUS
INTRAVENOUS | Status: AC
  Filled 2019-10-03: qty 40

## 2019-10-03 MED ORDER — MIDAZOLAM HCL 2 MG/2ML IJ SOLN
INTRAMUSCULAR | Status: AC
  Filled 2019-10-03: qty 2

## 2019-10-03 MED ORDER — FENTANYL CITRATE (PF) 100 MCG/2ML IJ SOLN: 25.0000 ug | INTRAMUSCULAR | Status: DC | PRN

## 2019-10-03 MED ORDER — FENTANYL CITRATE (PF) 100 MCG/2ML IJ SOLN
INTRAMUSCULAR | Status: DC | PRN
  Administered 2019-10-03: 50 ug via INTRAVENOUS

## 2019-10-03 MED ORDER — PROMETHAZINE HCL 25 MG/ML IJ SOLN: 6.2500 mg | INTRAMUSCULAR | Status: DC | PRN

## 2019-10-03 MED ORDER — ONDANSETRON HCL 4 MG/2ML IJ SOLN
INTRAMUSCULAR | Status: AC
  Filled 2019-10-03: qty 2

## 2019-10-03 MED ORDER — MIDAZOLAM HCL 5 MG/5ML IJ SOLN
INTRAMUSCULAR | Status: DC | PRN
  Administered 2019-10-03: 1 mg via INTRAVENOUS

## 2019-10-03 MED ORDER — DEXAMETHASONE SODIUM PHOSPHATE 10 MG/ML IJ SOLN
INTRAMUSCULAR | Status: DC | PRN
  Administered 2019-10-03: 10 mg via INTRAVENOUS

## 2019-10-03 MED ORDER — LIDOCAINE HCL 1 % IJ SOLN
INTRAMUSCULAR | Status: DC | PRN
  Administered 2019-10-03: 10 mL

## 2019-10-03 MED ORDER — DEXAMETHASONE SODIUM PHOSPHATE 10 MG/ML IJ SOLN
INTRAMUSCULAR | Status: AC
  Filled 2019-10-03: qty 1

## 2019-10-03 MED ORDER — KETOROLAC TROMETHAMINE 30 MG/ML IJ SOLN
INTRAMUSCULAR | Status: AC
  Filled 2019-10-03: qty 1

## 2019-10-03 MED ORDER — KETOROLAC TROMETHAMINE 30 MG/ML IJ SOLN
INTRAMUSCULAR | Status: DC | PRN
  Administered 2019-10-03: 15 mg via INTRAVENOUS

## 2019-10-03 MED ORDER — OXYCODONE HCL 5 MG PO TABS: 5.0000 mg | ORAL_TABLET | Freq: Once | ORAL | Status: DC | PRN

## 2019-10-03 MED ORDER — OXYCODONE HCL 5 MG/5ML PO SOLN: 5.0000 mg | Freq: Once | ORAL | Status: DC | PRN

## 2019-10-03 MED ORDER — ACETAMINOPHEN 500 MG PO TABS
ORAL_TABLET | ORAL | Status: AC
  Filled 2019-10-03: qty 2

## 2019-10-03 MED ORDER — FENTANYL CITRATE (PF) 100 MCG/2ML IJ SOLN
INTRAMUSCULAR | Status: AC
  Filled 2019-10-03: qty 2

## 2019-10-03 MED ORDER — ACETAMINOPHEN 500 MG PO TABS
1000.0000 mg | ORAL_TABLET | Freq: Once | ORAL | Status: AC
  Administered 2019-10-03: 1000 mg via ORAL

## 2019-10-03 MED ORDER — SODIUM CHLORIDE 0.9 % IR SOLN
Status: DC | PRN
  Administered 2019-10-03: 3000 mL

## 2019-10-03 MED ORDER — CEFAZOLIN SODIUM-DEXTROSE 2-4 GM/100ML-% IV SOLN
2.0000 g | Freq: Three times a day (TID) | INTRAVENOUS | Status: DC
  Administered 2019-10-03: 2 g via INTRAVENOUS

## 2019-10-03 MED ORDER — CEFAZOLIN SODIUM-DEXTROSE 2-4 GM/100ML-% IV SOLN
INTRAVENOUS | Status: AC
  Filled 2019-10-03: qty 100

## 2019-10-03 MED ORDER — PHENYLEPHRINE 40 MCG/ML (10ML) SYRINGE FOR IV PUSH (FOR BLOOD PRESSURE SUPPORT)
PREFILLED_SYRINGE | INTRAVENOUS | Status: DC | PRN
  Administered 2019-10-03 (×2): 80 ug via INTRAVENOUS

## 2019-10-03 MED ORDER — LACTATED RINGERS IV SOLN: INTRAVENOUS | Status: DC

## 2019-10-03 MED ORDER — ONDANSETRON HCL 4 MG/2ML IJ SOLN
INTRAMUSCULAR | Status: DC | PRN
  Administered 2019-10-03: 4 mg via INTRAVENOUS

## 2019-10-03 MED ORDER — LIDOCAINE 2% (20 MG/ML) 5 ML SYRINGE
INTRAMUSCULAR | Status: DC | PRN
  Administered 2019-10-03: 100 mg via INTRAVENOUS

## 2019-10-03 MED ORDER — LIDOCAINE 2% (20 MG/ML) 5 ML SYRINGE
INTRAMUSCULAR | Status: AC
  Filled 2019-10-03: qty 5

## 2019-10-03 MED ORDER — PROPOFOL 10 MG/ML IV BOLUS
INTRAVENOUS | Status: DC | PRN
Start: 2019-10-03 — End: 2019-10-03
  Administered 2019-10-03: 150 mg via INTRAVENOUS
  Administered 2019-10-03: 20 mg via INTRAVENOUS
  Administered 2019-10-03: 30 mg via INTRAVENOUS

## 2019-10-03 SURGICAL SUPPLY — 22 items
CATH ROBINSON RED A/P 14FR (CATHETERS) ×1 IMPLANT
CATH ROBINSON RED A/P 16FR (CATHETERS) ×2 IMPLANT
COVER WAND RF STERILE (DRAPES) ×2 IMPLANT
DEVICE MYOSURE LITE (MISCELLANEOUS) IMPLANT
DEVICE MYOSURE REACH (MISCELLANEOUS) ×1 IMPLANT
DILATOR CANAL MILEX (MISCELLANEOUS) IMPLANT
ELECT REM PT RETURN 9FT ADLT (ELECTROSURGICAL)
ELECTRODE REM PT RTRN 9FT ADLT (ELECTROSURGICAL) IMPLANT
GAUZE 4X4 16PLY RFD (DISPOSABLE) ×2 IMPLANT
GLOVE BIO SURGEON STRL SZ 6.5 (GLOVE) ×2 IMPLANT
GLOVE BIOGEL PI IND STRL 7.0 (GLOVE) ×2 IMPLANT
GLOVE BIOGEL PI INDICATOR 7.0 (GLOVE) ×3
GOWN STRL REUS W/TWL LRG LVL3 (GOWN DISPOSABLE) ×5 IMPLANT
IV NS IRRIG 3000ML ARTHROMATIC (IV SOLUTION) ×2 IMPLANT
KIT PROCEDURE FLUENT (KITS) ×2 IMPLANT
MYOSURE XL FIBROID (MISCELLANEOUS)
PACK VAGINAL MINOR WOMEN LF (CUSTOM PROCEDURE TRAY) ×2 IMPLANT
PAD OB MATERNITY 4.3X12.25 (PERSONAL CARE ITEMS) ×2 IMPLANT
PAD PREP 24X48 CUFFED NSTRL (MISCELLANEOUS) ×2 IMPLANT
SEAL CERVICAL OMNI LOK (ABLATOR) IMPLANT
SEAL ROD LENS SCOPE MYOSURE (ABLATOR) ×2 IMPLANT
SYSTEM TISS REMOVAL MYOSURE XL (MISCELLANEOUS) IMPLANT

## 2019-10-03 NOTE — Anesthesia Procedure Notes (Signed)
Procedure Name: LMA Insertion Date/Time: 10/03/2019 10:47 AM Performed by: Bonney Aid, CRNA Pre-anesthesia Checklist: Patient identified, Emergency Drugs available, Suction available and Patient being monitored Patient Re-evaluated:Patient Re-evaluated prior to induction Oxygen Delivery Method: Circle system utilized Preoxygenation: Pre-oxygenation with 100% oxygen Induction Type: IV induction Ventilation: Mask ventilation without difficulty LMA: LMA inserted LMA Size: 4.0 Number of attempts: 1 Airway Equipment and Method: Bite block Placement Confirmation: positive ETCO2 Tube secured with: Tape Dental Injury: Teeth and Oropharynx as per pre-operative assessment

## 2019-10-03 NOTE — Transfer of Care (Signed)
Immediate Anesthesia Transfer of Care Note  Patient: Christine Steele  Procedure(s) Performed: DILATATION & CURETTAGE/HYSTEROSCOPY WITH MYOSURE (N/A Vagina )  Patient Location: PACU  Anesthesia Type:General  Level of Consciousness: drowsy  Airway & Oxygen Therapy: Patient Spontanous Breathing and Patient connected to nasal cannula oxygen  Post-op Assessment: Report given to RN  Post vital signs: Reviewed and stable  Last Vitals:  Vitals Value Taken Time  BP 144/74 10/03/19 1124  Temp    Pulse 64 10/03/19 1125  Resp 14 10/03/19 1125  SpO2 100 % 10/03/19 1125  Vitals shown include unvalidated device data.  Last Pain:  Vitals:   10/03/19 0926  TempSrc: Oral  PainSc: 0-No pain      Patients Stated Pain Goal: 5 (32/02/33 4356)  Complications: No complications documented.

## 2019-10-03 NOTE — Anesthesia Preprocedure Evaluation (Addendum)
Anesthesia Evaluation  Patient identified by MRN, date of birth, ID band Patient awake    Reviewed: Allergy & Precautions, NPO status , Patient's Chart, lab work & pertinent test results, reviewed documented beta blocker date and time   History of Anesthesia Complications Negative for: history of anesthetic complications  Airway Mallampati: II  TM Distance: >3 FB Neck ROM: Full    Dental no notable dental hx.    Pulmonary sleep apnea , former smoker,    Pulmonary exam normal        Cardiovascular hypertension, Pt. on home beta blockers and Pt. on medications Normal cardiovascular exam  2019 nuclear stress: EF 52%, T wave inversion noted during stress in V4, V5, aVF and V3 leads, low risk study, no evidence of ischemia    Neuro/Psych Anxiety 76mm right MCA aneurysm    GI/Hepatic Neg liver ROS, GERD  ,  Endo/Other  negative endocrine ROS  Renal/GU Renal InsufficiencyRenal disease  negative genitourinary   Musculoskeletal  (+) Arthritis ,   Abdominal   Peds  Hematology  (+) anemia ,   Anesthesia Other Findings Day of surgery medications reviewed with patient.  Reproductive/Obstetrics negative OB ROS                            Anesthesia Physical Anesthesia Plan  ASA: II  Anesthesia Plan: General   Post-op Pain Management:    Induction: Intravenous  PONV Risk Score and Plan: 4 or greater and Ondansetron, Dexamethasone and Treatment may vary due to age or medical condition  Airway Management Planned: LMA  Additional Equipment: None  Intra-op Plan:   Post-operative Plan: Extubation in OR  Informed Consent: I have reviewed the patients History and Physical, chart, labs and discussed the procedure including the risks, benefits and alternatives for the proposed anesthesia with the patient or authorized representative who has indicated his/her understanding and acceptance.     Dental  advisory given  Plan Discussed with: CRNA  Anesthesia Plan Comments:        Anesthesia Quick Evaluation

## 2019-10-03 NOTE — Op Note (Signed)
Operative Note  10/03/2019  11:16 AM  PATIENT:  Christine Steele  69 y.o. female  PRE-OPERATIVE DIAGNOSIS:  Post menopausal bleeding, endometrial polyp  POST-OPERATIVE DIAGNOSIS:  Post menopausal bleeding, endometrial polyps, endocervical polyp  PROCEDURE:  Procedure(s): DILATATION & CURETTAGE/HYSTEROSCOPY WITH MYOSURE EXCISIONS  SURGEON:  Surgeon(s): [name], MD  ANESTHESIA:   general  FINDINGS: Endometrial polyps, endocervical polyp  DESCRIPTION OF OPERATION: Under general anesthesia with laryngeal mask, the patient is in lithotomy position.  She is prepped with Betadine on the suprapubic, vulvar and vaginal areas.  The bladder is catheterized and the patient is draped as usual.  Timeout is done.  The vaginal exam reveals an anteverted uterus, normal volume, no adnexal mass.  The speculum is inserted in the vagina and the anterior lip of the cervix is grasped with a tenaculum.  A paracervical block is done with lidocaine 1% a total of 20 cc at 4 and 8:00.  Dilation of the cervix with Pratt dilators up to #19 without difficulty.  The hysteroscope was inserted in the intra uterine cavity.  2 polyps are present, 1 at the left fundal area and the other 1 at the right fundal area.  Pictures are taken.  The reach MyoSure is inserted.  Both polyps are excised.  Pictures are taken after excisions.  We then take pictures and retract at the endocervical canal.  An endocervical polyp is seen.  Pictures are taken.  The endocervical polyp is excised completely.  Pictures are taken after excision.  The hysteroscope with MyoSure are removed.  We then proceeded with a systematic curettage of the intra uterine cavity with a sharp curette.  All specimens are sent together to pathology.  All instruments are removed.  Hemostasis was adequate.  The speculum was also removed.  The patient is brought to recovery room in good and stable status.  ESTIMATED BLOOD LOSS: 5 mL Fluid deficit 300  cc   Intake/Output Summary (Last 24 hours) at 10/03/2019 1116 Last data filed at 10/03/2019 1112 Gross per 24 hour  Intake 100 ml  Output 80 ml  Net 20 ml     BLOOD ADMINISTERED:none   LOCAL MEDICATIONS USED:  LIDOCAINE   SPECIMEN:  Source of Specimen:  Endometrial Polyps, Endocervical Polyp, Endometrial curettings  DISPOSITION OF SPECIMEN:  PATHOLOGY  COUNTS:  YES  PLAN OF CARE: Transfer to PACU  Marie-Lyne LavoieMD11:16 AM

## 2019-10-03 NOTE — H&P (Signed)
Christine Steele is an 69 y.o. female. [id number]   RP: HSC/Myosure Excision/D+C on 10/03/2019  HPI:  No change x visit 09/28/2019.PMB x 1 week with cramping in early April 2021. Abstinent x many months. Postmenopause on no HRT. H/O Endometrial Polyp removed in 2018.   Pertinent Gynecological History: Menses: PMB Contraception: none Blood transfusions: none Sexually transmitted diseases: no past history Last mammogram: normal  Last pap: normal     Menstrual History:  No LMP recorded. Patient is postmenopausal.    Past Medical History:  Diagnosis Date  . Aneurysm (HCC)    31mm Right MCA  . Arthritis   . Cataract    Bilateral  . Chronic kidney disease    Dr. Shelia Media (PCP)   . Hypertension   . OSA on CPAP    havent used CPAP in long time per patient due to unable to get filters to change out  . Osteopenia 01/2017   T score -1.4 FRAX 3.4% / 0.4%  . Tachycardia   . Vitamin D deficiency     Past Surgical History:  Procedure Laterality Date  . BIOPSY BREAST  1970   Right Breast  . COLONOSCOPY    . DILATATION & CURETTAGE/HYSTEROSCOPY WITH MYOSURE N/A 03/06/2017   Procedure: DILATATION & CURETTAGE/HYSTEROSCOPY WITH MYOSURE;  Surgeon: [name], MD;  Location: [place];  Service: Gynecology;  Laterality: N/A;  Request 7:30am in [facility] block time  requests one hour  . right lumpectomy  1970   benign    Family History  Problem Relation Age of Onset  . Hypertension Mother   . Heart attack Father   . Stroke Father   . Aneurysm Neg Hx     Social History:  reports that she quit smoking about 40 years ago. Her smoking use included cigarettes. She has a 5.00 pack-year smoking history. She has never used smokeless tobacco. She reports that she does not drink alcohol and does not use drugs.  Allergies:  Allergies  Allergen Reactions  . Amlodipine     Leg/feet swelling.  . Chocolate Other (See Comments)    Headache  . Coconut Fatty  Acids Other (See Comments)  . Hydralazine     ? Fast heart rate  . Other     MSG causes headaches  . Peanut-Containing Drug Products     All  peanuts headache    Medications Prior to Admission  Medication Sig Dispense Refill Last Dose  . Ascorbic Acid (VITAMIN C PO) Take by mouth daily.   10/02/2019 at Unknown time  . aspirin EC 81 MG tablet Take 81 mg by mouth daily. Takes 3 every other day Takes 2 on other days   Past Week at Unknown time  . atenolol (TENORMIN) 25 MG tablet Take 1.5 tablets (37.5 mg total) by mouth daily. 135 tablet 3 10/03/2019 at 0915  . calcium-vitamin D 250-100 MG-UNIT tablet Take 1 tablet by mouth 2 (two) times daily.   10/02/2019 at Unknown time  . Cholecalciferol (VITAMIN D PO) Take 1,000 Units by mouth.    10/02/2019 at Unknown time  . irbesartan (AVAPRO) 300 MG tablet Take 300 mg by mouth daily.   10/03/2019 at 0915  . Multiple Vitamin (MULTIVITAMIN WITH MINERALS) TABS tablet Take 1 tablet by mouth daily.   10/02/2019 at Unknown time  . rosuvastatin (CRESTOR) 20 MG tablet Take 1 tablet (20 mg total) by mouth daily. (Patient taking differently: Take 20 mg by mouth every evening. ) 90 tablet 1 10/02/2019  at Unknown time  . UNABLE TO FIND Calcium 1200 mg plus 1000 iu vitamin d 3 daily   10/02/2019 at Unknown time    REVIEW OF SYSTEMS: A ROS was performed and pertinent positives and negatives are included in the history.  GENERAL: No fevers or chills. HEENT: No change in vision, no earache, sore throat or sinus congestion. NECK: No pain or stiffness. CARDIOVASCULAR: No chest pain or pressure. No palpitations. PULMONARY: No shortness of breath, cough or wheeze. GASTROINTESTINAL: No abdominal pain, nausea, vomiting or diarrhea, melena or bright red blood per rectum. GENITOURINARY: No urinary frequency, urgency, hesitancy or dysuria. MUSCULOSKELETAL: No joint or muscle pain, no back pain, no recent trauma. DERMATOLOGIC: No rash, no itching, no lesions. ENDOCRINE: No polyuria,  polydipsia, no heat or cold intolerance. No recent change in weight. HEMATOLOGICAL: No anemia or easy bruising or bleeding. NEUROLOGIC: No headache, seizures, numbness, tingling or weakness. PSYCHIATRIC: No depression, no loss of interest in normal activity or change in sleep pattern.     Blood pressure (!) 148/73, pulse 64, temperature 98.9 F (37.2 C), temperature source Oral, resp. rate 16, height 5\' 3"  (1.6 m), weight 74.9 kg, SpO2 99 %.  Physical Exam:  See office notes  Covid Negative  Pelvic US 08/04/2019: T/V images. Comparison is made with previous scan in 2018. Enlarged, irregular anteverted uterus with several intramural and subserosal fibroids which are smaller today than on the previous scan. The uterus is measured at 7.66 x 5.63 x 6.63 cm. The endometrial lining is thickened with cystic changes, measured at 10.12 mm. Feeder vessels are identified to the thickened area similar in appearance to the previous scan prior to the hysteroscopy with excision and D&C. Right ovary small with atrophic appearance. Left ovary with a 3.0 x 2.2 cm simple cystic structure similar in size and appearance to previous scan. No free fluid in the posterior cul-de-sac.   Assessment/Plan:68 [id number]   1. Postmenopausal bleeding Postmenopausal bleeding x1 week on no hormone replacement therapy. History of endometrial polyp excised in 2018. Pelvic ultrasound findings reviewed with patient. The uterus is normal, but the endometrial lining is thickened with cystic changes,measured at 10.12 mm with feeder vessels to the thickened area. The appearance is similar to the previous scan prior to hysteroscopy and excision with D&C in 2018. Probable endometrial polyps. Decision to schedule a hysteroscopy with excision and D&C.  Preop preparation, surgical procedure with risks and benefits as well as postop expectations and precautions thoroughly reviewed with patient.  Patient informed that  antibiotics IV will be given to decrease the risk of infection and PAS will be applied on her legs to help circulation and lower the risk of blood clots.  The risk of uterine perforation was discussed.  Patient voiced understanding and agreement with plan.  2. Endometrial thickening on ultrasound As above.  3. Chronic hypertension Seen by Dr Shelia Media.  BP now controled under medication.                         Patient was counseled as to the risk of surgery to include the following:  1. Infection (prohylactic antibiotics will be administered)  2. DVT/Pulmonary Embolism (prophylactic pneumo compression stockings will be used)  3.Trauma to internal organs requiring additional surgical procedure to repair any injury to internal organs requiring perhaps additional hospitalization days.  4.Hemmorhage requiring transfusion and blood products which carry risks such as anaphylactic reaction, hepatitis and AIDS  Patient had received literature information on  the procedure scheduled and all her questions were answered and fully accepts all risk.   Marie-Lyne Kymberlee Viger 10/03/2019, 10:29 AM

## 2019-10-03 NOTE — Anesthesia Postprocedure Evaluation (Signed)
Anesthesia Post Note  Patient: Christine Steele  Procedure(s) Performed: DILATATION & CURETTAGE/HYSTEROSCOPY WITH MYOSURE (N/A Vagina )     Patient location during evaluation: PACU Anesthesia Type: General Level of consciousness: awake and alert and oriented Pain management: pain level controlled Vital Signs Assessment: post-procedure vital signs reviewed and stable Respiratory status: spontaneous breathing, nonlabored ventilation and respiratory function stable Cardiovascular status: blood pressure returned to baseline Postop Assessment: no apparent nausea or vomiting Anesthetic complications: no   No complications documented.  Last Vitals:  Vitals:   10/03/19 1215 10/03/19 1230  BP: (!) 146/72 (!) 152/66  Pulse: (!) 48 (!) 48  Resp: 16 16  Temp:    SpO2: 96% 98%    Last Pain:  Vitals:   10/03/19 1230  TempSrc:   PainSc: [place]

## 2019-10-03 NOTE — Discharge Instructions (Signed)
DISCHARGE INSTRUCTIONS: HYSTEROSCOPY  The following instructions have been prepared to help you care for yourself upon your return home.   May take Ibuprofen after  5:00pm today    Personal hygiene: Marland Kitchen Use sanitary pads for vaginal drainage, not tampons. . Shower the day after your procedure. . NO tub baths, pools or Jacuzzis for 2-3 weeks. . Wipe front to back after using the bathroom.  Activity and limitations: . Do NOT drive or operate any equipment for 24 hours. The effects of anesthesia are still present and drowsiness may result. . Do NOT rest in bed all day. . Walking is encouraged. . Walk up and down stairs slowly. . You may resume your normal activity in one to two days or as indicated by your physician. Sexual activity: NO intercourse for at least 2 weeks after the procedure, or as indicated by your Doctor.  Diet: Eat a light meal as desired this evening. You may resume your usual diet tomorrow.  Return to Work: You may resume your work activities in one to two days or as indicated by Marine scientist.  What to expect after your surgery: Expect to have vaginal bleeding/discharge for 2-3 days and spotting for up to 10 days. It is not unusual to have soreness for up to 1-2 weeks. You may have a slight burning sensation when you urinate for the first day. Mild cramps may continue for a couple of days. You may have a regular period in 2-6 weeks.  Call your doctor for any of the following: . Excessive vaginal bleeding or clotting, saturating and changing one pad every hour. . Inability to urinate 6 hours after discharge from hospital. . Pain not relieved by pain medication. . Fever of 100.4 F or greater. . Unusual vaginal discharge or odor.  Return to office _________________Call for an appointment ___________________ Patient's signature: ______________________ Nurse's signature ________________________    Post Anesthesia Home Care Instructions  Activity: Get plenty  of rest for the remainder of the day. A responsible individual must stay with you for 24 hours following the procedure.  For the next 24 hours, DO NOT: -Drive a car -Paediatric nurse -Drink alcoholic beverages -Take any medication unless instructed by your physician -Make any legal decisions or sign important papers.  Meals: Start with liquid foods such as gelatin or soup. Progress to regular foods as tolerated. Avoid greasy, spicy, heavy foods. If nausea and/or vomiting occur, drink only clear liquids until the nausea and/or vomiting subsides. Call your physician if vomiting continues.  Special Instructions/Symptoms: Your throat may feel dry or sore from the anesthesia or the breathing tube placed in your throat during surgery. If this causes discomfort, gargle with warm salt water. The discomfort should disappear within 24 hours.  If you had a scopolamine patch placed behind your ear for the management of post- operative nausea and/or vomiting:  1. The medication in the patch is effective for 72 hours, after which it should be removed.  Wrap patch in a tissue and discard in the trash. Wash hands thoroughly with soap and water. 2. You may remove the patch earlier than 72 hours if you experience unpleasant side effects which may include dry mouth, dizziness or visual disturbances. 3. Avoid touching the patch. Wash your hands with soap and water after contact with the patch.

## 2019-10-04 ENCOUNTER — Telehealth: Payer: Self-pay

## 2019-10-04 ENCOUNTER — Encounter (HOSPITAL_BASED_OUTPATIENT_CLINIC_OR_DEPARTMENT_OTHER): Payer: Self-pay | Admitting: Obstetrics & Gynecology

## 2019-10-04 NOTE — Telephone Encounter (Signed)
Surgery yesterday. Ok to start back taking her Aspirin?

## 2019-10-05 LAB — SURGICAL PATHOLOGY

## 2019-10-05 NOTE — Telephone Encounter (Signed)
Yes to start back on Aspirin.

## 2019-10-05 NOTE — Telephone Encounter (Signed)
Per DPR access note on file I left detailed message in voice mail.

## 2019-10-10 NOTE — Progress Notes (Signed)
[facility] [employer] PREP Weekly Session   Patient Details  Name: Christine Steele MRN: [id number] Date of Birth: 09/06/1950 Age: 69 y.o. PCP: [name], MD  Vitals:   10/10/19 1547  BP: 131/79  Pulse: (!) 54     [name] Weekly seesion - 10/10/19 1500      Weekly Session   Topic Discussed Expectations and non-scale victories    Minutes exercised this week 400 minutes    Classes attended to date 18          Fun things since last meeting: cookout Father's Day Grateful for: blood pressure reading and heart rate so much better Nutrition celebration: did good until Sun, had some pound cake Barriers/struggles: afraid to do strenuous exercise because of HR and BP   Pam Tally Joe 10/10/2019, 3:49 PM

## 2019-10-13 ENCOUNTER — Encounter: Payer: Self-pay | Admitting: Cardiovascular Disease

## 2019-10-13 ENCOUNTER — Other Ambulatory Visit: Payer: Self-pay

## 2019-10-13 ENCOUNTER — Ambulatory Visit (INDEPENDENT_AMBULATORY_CARE_PROVIDER_SITE_OTHER): Payer: Medicare Other | Admitting: Cardiovascular Disease

## 2019-10-13 VITALS — BP 130/74 | HR 63 | Temp 98.0°F | Ht 63.0 in | Wt 166.4 lb

## 2019-10-13 DIAGNOSIS — G4733 Obstructive sleep apnea (adult) (pediatric): Secondary | ICD-10-CM | POA: Diagnosis not present

## 2019-10-13 DIAGNOSIS — I10 Essential (primary) hypertension: Secondary | ICD-10-CM

## 2019-10-13 DIAGNOSIS — E785 Hyperlipidemia, unspecified: Secondary | ICD-10-CM

## 2019-10-13 DIAGNOSIS — E663 Overweight: Secondary | ICD-10-CM

## 2019-10-13 LAB — COMPREHENSIVE METABOLIC PANEL
ALT: 35 IU/L — ABNORMAL HIGH (ref 0–32)
AST: 20 IU/L (ref 0–40)
Albumin/Globulin Ratio: 1.8 (ref 1.2–2.2)
Albumin: 4.2 g/dL (ref 3.8–4.8)
Alkaline Phosphatase: 123 IU/L — ABNORMAL HIGH (ref 48–121)
BUN/Creatinine Ratio: 16 (ref 12–28)
BUN: 18 mg/dL (ref 8–27)
Bilirubin Total: 0.3 mg/dL (ref 0.0–1.2)
CO2: 22 mmol/L (ref 20–29)
Calcium: 9.3 mg/dL (ref 8.7–10.3)
Chloride: 107 mmol/L — ABNORMAL HIGH (ref 96–106)
Creatinine, Ser: 1.12 mg/dL — ABNORMAL HIGH (ref 0.57–1.00)
GFR calc Af Amer: 59 mL/min/{1.73_m2} — ABNORMAL LOW (ref >59)
GFR calc non Af Amer: 51 mL/min/{1.73_m2} — ABNORMAL LOW (ref >59)
Globulin, Total: 2.3 g/dL (ref 1.5–4.5)
Glucose: 76 mg/dL (ref 65–99)
Potassium: 4.2 mmol/L (ref 3.5–5.2)
Sodium: 142 mmol/L (ref 134–144)
Total Protein: 6.5 g/dL (ref 6.0–8.5)

## 2019-10-13 LAB — LIPID PANEL
Chol/HDL Ratio: 1.8 ratio (ref 0.0–4.4)
Cholesterol, Total: 128 mg/dL (ref 100–199)
HDL: 72 mg/dL (ref >39)
LDL Chol Calc (NIH): 45 mg/dL (ref 0–99)
Triglycerides: 44 mg/dL (ref 0–149)
VLDL Cholesterol Cal: 11 mg/dL (ref 5–40)

## 2019-10-13 NOTE — Patient Instructions (Signed)
Medication Instructions:  CONTINUE WITH CURRENT MEDICATIONS. NO CHANGES.  *If you need a refill on your cardiac medications before your next appointment, please call your pharmacy*   Follow-Up: At [facility], you and your health needs are our priority.  As part of our continuing mission to provide you with exceptional heart care, we have created designated Provider Care Teams.  These Care Teams include your primary Cardiologist (physician) and Advanced Practice Providers (APPs -  Physician Assistants and Nurse Practitioners) who all work together to provide you with the care you need, when you need it.  We recommend signing up for the patient portal called "MyChart".  Sign up information is provided on this After Visit Summary.  MyChart is used to connect with patients for Virtual Visits (Telemedicine).  Patients are able to view lab/test results, encounter notes, upcoming appointments, etc.  Non-urgent messages can be sent to your provider as well.   To learn more about what you can do with MyChart, go to [email or web address].    Your next appointment:   12 month(s)  The format for your next appointment:   In Person  Provider:   Shelva Majestic, MD   Other Instructions Changes made to your machine today New mask- Resmed Airfit [id number]

## 2019-10-17 ENCOUNTER — Telehealth: Payer: Self-pay | Admitting: *Deleted

## 2019-10-17 DIAGNOSIS — Z5181 Encounter for therapeutic drug level monitoring: Secondary | ICD-10-CM

## 2019-10-17 DIAGNOSIS — I10 Essential (primary) hypertension: Secondary | ICD-10-CM

## 2019-10-17 NOTE — Telephone Encounter (Signed)
-----   Message from Skeet Latch, MD sent at 10/13/2019  5:43 PM EDT ----- Cholesterol levels are fantastic.  Kidney function is stable.  Liver function is very mildly elevated.  This may be because of the rosuvastatin.  Given that her cholesterol is so excellent, we can try cutting back on the rosuvastatin to 10 mg.  Repeat lipids and a CMP in 3 to 4 months.

## 2019-10-17 NOTE — Progress Notes (Signed)
[facility] [employer] PREP Weekly Session   Patient Details  Name: Christine Steele MRN: [id number] Date of Birth: 01-27-1951 Age: 69 y.o. PCP: [name], MD  Vitals:   10/17/19 1603  Weight: 166 lb 8 oz (75.5 kg)     [name] Weekly seesion - 10/17/19 1600      Weekly Session   Topic Discussed --   portion size, organic produce, circuit workout    Minutes exercised this week 460 minutes    Classes attended to date 14         BP 124/77 pulse 68 (pt reported) Grateful for moving so much better Nutrition celebration: ate well, no cheating Barriers/struggles: preparing meal ahead of time  Barnett Hatter 10/17/2019, 4:05 PM

## 2019-10-17 NOTE — Telephone Encounter (Signed)
Advised patient of lab results and placed orders for rechecking of labs

## 2019-10-19 ENCOUNTER — Encounter: Payer: Self-pay | Admitting: Cardiovascular Disease

## 2019-10-19 NOTE — Progress Notes (Signed)
Cardiology Office Note    Date:  10/19/2019   ID:  Sela, Falk February 20, 1951, MRN [id number]  PCP:  [name], MD  Cardiologist:  Shelva Majestic, MD (sleep); Dr. Oval Linsey  1 year follow-up sleep evaluation  History of Present Illness:  KAJOL CRISPEN is a 69 y.o. female who is followed by Dr. Skeet Latch for her cardiology care.  She has a history of hypertension, as well as a brain aneurysm.  Approximately 10 years ago she had a sleep study in [place].  She initiated CPAP therapy but only used it for a short duration.  She never saw a sleep doctor in follow-up.  The company ultimately dissolved and she ultimately stopped using CPAP.  She has seen Dr. Oval Linsey and because of complaints of loud snoring, nonrestorative sleep, frequent awakenings she was referred for a sleep study.  A diagnostic polysomnogram was done on May 17, 2018 which confirmed significant sleep apnea.  Overall sleep apnea was moderate with an AHI of 22.4/h.  However during REM sleep, sleep apnea was severe with an AHI of 49.8/h.  She had significant oxygen desaturation to a nadir of 78%.  There was moderate snoring. On March 1,2020 she underwent a CPAP titration and CPAP was titrated up to 10 cm water pressure.  Oxygen nadir was 95% and AHI during that study at 10 cm was 0.  She initiated CPAP therapy in late April 2020.    I saw her for initial telemedicine evaluation on September 23, 2018.  She was sleeping significantly better since initiating CPAP therapy.   Her DME company is [employer].  A download was obtained from Aug 24, 2018 through September 22, 2018.  She is meeting compliance standards and usage days was 90%.  3 days that she did not use therapy was because she had cleaned her tubing and it was still significantly wet and therefore she did not use it those nights.  At 10 cm water pressure, AHI is improved but still elevated at 10.9/h.  She has been using nasal pillows.   During that evaluation, I  made some changes and switched her to an auto mode initiating a 10 cm up to 20 cm and increased to restart up pressure.  Over the past year, she has seen Dr. Oval Linsey for cardiovascular care and her atenolol dose was reduced to 37.5 mg it was recommended she continue irbesartan at her 300 mg dose.  She has been on rosuvastatin for hyperlipidemia.  I obtained a new download in the office today from May 25 through October 12, 2019.  She is meeting compliance standards however usage was only 73 days.  Average usage was just under 8 hours at 7 hours and 53 minutes.  The days she did not use it was when she was sick but otherwise her use has been 100%.  She continues to sleep better with CPAP.  AHI was 6.9 and her 95th percentile pressure was 13.5.  Maximum average pressure was 14.9.  She did have central apnea of 4.1.  She has been using nasal pillow mask.  She is unaware of breakthrough snoring.  An Epworth Sleepiness Scale score was calculated today which endorsed at 0 argue against any daytime sleepiness.  She presents for yearly evaluation.  Past Medical History:  Diagnosis Date  . Aneurysm (HCC)    70m Right MCA  . Arthritis   . Cataract    Bilateral  . Chronic kidney disease    Dr.  Pharr (PCP)   . Hypertension   . OSA on CPAP    havent used CPAP in long time per patient due to unable to get filters to change out  . Osteopenia 01/2017   T score -1.4 FRAX 3.4% / 0.4%  . Tachycardia   . Vitamin D deficiency     Past Surgical History:  Procedure Laterality Date  . BIOPSY BREAST  1970   Right Breast  . COLONOSCOPY    . DILATATION & CURETTAGE/HYSTEROSCOPY WITH MYOSURE N/A 03/06/2017   Procedure: DILATATION & CURETTAGE/HYSTEROSCOPY WITH MYOSURE;  Surgeon: [name], MD;  Location: [facility];  Service: Gynecology;  Laterality: N/A;  Request 7:30am in [place] [facility] block time  requests one hour  . DILATATION & CURETTAGE/HYSTEROSCOPY WITH MYOSURE N/A 10/03/2019    Procedure: DILATATION & CURETTAGE/HYSTEROSCOPY WITH MYOSURE;  Surgeon: [name], MD;  Location: [facility];  Service: Gynecology;  Laterality: N/A;  request 8:30am OR time in [place] [facility] block requests 45 minutes  . right lumpectomy  1970   benign    Current Medications: Outpatient Medications Prior to Visit  Medication Sig Dispense Refill  . Ascorbic Acid (VITAMIN C PO) Take by mouth daily.    Marland Kitchen aspirin EC 81 MG tablet Take 81 mg by mouth daily. Takes 3 every other day Takes 2 on other days    . atenolol (TENORMIN) 25 MG tablet Take 1.5 tablets (37.5 mg total) by mouth daily. 135 tablet 3  . calcium-vitamin D 250-100 MG-UNIT tablet Take 1 tablet by mouth 2 (two) times daily.    . Cholecalciferol (VITAMIN D PO) Take 1,000 Units by mouth.     . irbesartan (AVAPRO) 300 MG tablet Take 300 mg by mouth daily.    . Multiple Vitamin (MULTIVITAMIN WITH MINERALS) TABS tablet Take 1 tablet by mouth daily.    . rosuvastatin (CRESTOR) 20 MG tablet Take 1 tablet (20 mg total) by mouth daily. (Patient taking differently: Take 20 mg by mouth every evening. ) 90 tablet 1  . UNABLE TO FIND Calcium 1200 mg plus 1000 iu vitamin d 3 daily     No facility-administered medications prior to visit.     Allergies:   Amlodipine, Chocolate, Coconut fatty acids, Hydralazine, Other, and Peanut-containing drug products   Social History   Socioeconomic History  . Marital status: Married    Spouse name: Trilby Drummer  . Number of children: 0  . Years of education: 12+  . Highest education level: Not on file  Occupational History  . Not on file  Tobacco Use  . Smoking status: Former Smoker    Packs/day: 1.00    Years: 5.00    Pack years: 5.00    Types: Cigarettes    Quit date: 04/22/1979    Years since quitting: 40.5  . Smokeless tobacco: Never Used  Vaping Use  . Vaping Use: Never used  Substance and Sexual Activity  . Alcohol use: No    Alcohol/week: 0.0 standard drinks  . Drug  use: No  . Sexual activity: Yes    Partners: Male    Birth control/protection: Post-menopausal    Comment: 1ST Intercourse- 77, partners-  73, married- 62 yrs   Other Topics Concern  . Not on file  Social History Narrative   Lives with husband   Caffeine use: none   Right handed   Social Determinants of Health   Financial Resource Strain:   . Difficulty of Paying Living Expenses:   Food Insecurity:   .  Worried About Charity fundraiser in the Last Year:   . Arboriculturist in the Last Year:   Transportation Needs:   . Film/video editor (Medical):   Marland Kitchen Lack of Transportation (Non-Medical):   Physical Activity:   . Days of Exercise per Week:   . Minutes of Exercise per Session:   Stress:   . Feeling of Stress :   Social Connections:   . Frequency of Communication with Friends and Family:   . Frequency of Social Gatherings with Friends and Family:   . Attends Religious Services:   . Active Member of Clubs or Organizations:   . Attends Archivist Meetings:   Marland Kitchen Marital Status:      Family History:  The patient's family history includes Heart attack in her father; Hypertension in her mother; Stroke in her father.   ROS General: Negative; No fevers, chills, or night sweats;  HEENT: Negative; No changes in vision or hearing, sinus congestion, difficulty swallowing Pulmonary: Negative; No cough, wheezing, shortness of breath, hemoptysis Cardiovascular: Hypertension, no chest pain GI: Negative; No nausea, vomiting, diarrhea, or abdominal pain GU: Negative; No dysuria, hematuria, or difficulty voiding Musculoskeletal: Negative; no myalgias, joint pain, or weakness Hematologic/Oncology: Negative; no easy bruising, bleeding Endocrine: Negative; no heat/cold intolerance; no diabetes Neuro: Negative; no changes in balance, headaches Skin: Negative; No rashes or skin lesions Psychiatric: Negative; No behavioral problems, depression Sleep: Positive for OSA on CPAP  therapy without breakthrough snoring or residual daytime sleepiness.  No bruxism, restless legs, hypnogognic hallucinations, no cataplexy Other comprehensive 14 point system review is negative.   PHYSICAL EXAM:   VS:  BP 130/74   Pulse 63   Temp 98 F (36.7 C)   Ht '5\' 3"'  (1.6 m)   Wt 166 lb 6.4 oz (75.5 kg)   SpO2 98%   BMI 29.48 kg/m     Repeat blood pressure by me was 128/78  Purposeful weight loss with a weight in January 2021 at 190 being reduced to 166.  Wt Readings from Last 3 Encounters:  10/17/19 166 lb 8 oz (75.5 kg)  10/13/19 166 lb 6.4 oz (75.5 kg)  10/03/19 165 lb 3.2 oz (74.9 kg)    General: Alert, oriented, no distress.  Skin: normal turgor, no rashes, warm and dry HEENT: Normocephalic, atraumatic. Pupils equal round and reactive to light; sclera anicteric; extraocular muscles intact;  Nose without nasal septal hypertrophy Mouth/Parynx benign; Mallinpatti scale 3 Neck: No JVD, no carotid bruits; normal carotid upstroke Lungs: clear to ausculatation and percussion; no wheezing or rales Chest wall: without tenderness to palpitation Heart: PMI not displaced, RRR, s1 s2 normal, 1/6 systolic murmur, no diastolic murmur, no rubs, gallops, thrills, or heaves Abdomen: soft, nontender; no hepatosplenomehaly, BS+; abdominal aorta nontender and not dilated by palpation. Back: no CVA tenderness Pulses 2+ Musculoskeletal: full range of motion, normal strength, no joint deformities Extremities: no clubbing cyanosis or edema, Homan's sign negative  Neurologic: grossly nonfocal; Cranial nerves grossly wnl Psychologic: Normal mood and affect   Studies/Labs Reviewed:   EKG:  EKG is not ordered today.  I personally reviewed the ECG from May 04, 2019 which showed sinus bradycardia 57.  Normal intervals.  No ectopy.  Recent Labs: BMP Latest Ref Rng & Units 10/13/2019 09/29/2019 07/04/2019  Glucose 65 - 99 mg/dL 76 90 70  BUN 8 - 27 mg/dL '18 17 13  ' Creatinine 0.57 - 1.00  mg/dL 1.12(H) 1.04(H) 1.03(H)  BUN/Creat Ratio 12 - 28  16 - 13  Sodium 134 - 144 mmol/L 142 138 141  Potassium 3.5 - 5.2 mmol/L 4.2 4.0 4.4  Chloride 96 - 106 mmol/L 107(H) 104 106  CO2 20 - 29 mmol/L '22 26 20  ' Calcium 8.7 - 10.3 mg/dL 9.3 8.7(L) 9.3     Hepatic Function Latest Ref Rng & Units 10/13/2019 10/09/2007 10/13/2006  Total Protein 6.0 - 8.5 g/dL 6.5 6.3 7.5  Albumin 3.8 - 4.8 g/dL 4.2 3.6 3.8  AST 0 - 40 IU/L '20 20 20  ' ALT 0 - 32 IU/L 35(H) 26 23  Alk Phosphatase 48 - 121 IU/L 123(H) 94 98  Total Bilirubin 0.0 - 1.2 mg/dL 0.3 0.5 0.8  Bilirubin, Direct 0.0 - 0.3 mg/dL - - 0.1    CBC Latest Ref Rng & Units 09/29/2019 03/04/2017 10/28/2009  WBC 4.0 - 10.5 K/uL 5.7 4.7 4.1  Hemoglobin 12.0 - 15.0 g/dL 11.8(L) 12.7 11.4(L)  Hematocrit 36 - 46 % 37.5 37.9 34.2(L)  Platelets 150 - 400 K/uL 295 249 213   Lab Results  Component Value Date   MCV 93.3 09/29/2019   MCV 90.2 03/04/2017   MCV 91.2 10/28/2009   Lab Results  Component Value Date   TSH 1.480 11/28/2015   Lab Results  Component Value Date   HGBA1C 5.8 (H) 11/28/2015     BNP No results found for: BNP  ProBNP No results found for: PROBNP   Lipid Panel     Component Value Date/Time   CHOL 128 10/13/2019 0945   TRIG 44 10/13/2019 0945   HDL 72 10/13/2019 0945   CHOLHDL 1.8 10/13/2019 0945   CHOLHDL 2.6 10/28/2009 0455   VLDL 11 10/28/2009 0455   LDLCALC 45 10/13/2019 0945   LDLDIRECT 121.6 10/13/2006 1729   LABVLDL 11 10/13/2019 0945     RADIOLOGY: No results found.   Additional studies/ records that were reviewed today include:  A new download was obtained in the office from Sep 11, 2019 through October 10, 2019.  ASSESSMENT:    1. OSA (obstructive sleep apnea)   2. Essential hypertension   3. Hyperlipidemia LDL goal <70   4. Overweight (BMI 25.0-29.9)     PLAN:  Ms. Mellisa Arshad is a 69 year old female who has a history of hypertension, moderate CAD, brain aneurysm, as well as obstructive  sleep apnea on CPAP therapy.  I again reviewed her sleep studies from January and March 2021.  When I saw her last year, she initially was on a CPAP set pressure at 10 cm and this was changed to an auto mode.  She has been using a nasal pillow AirFit P 30 mask.  Her previous loud snoring has resolved with treatment.  I reviewed her most recent download with her.  AHI is improved but she still having events.  Of note, her response to treatment was in the soft mode and I have recommended that this be changed to the standard mode for improved sensitivity in detection which may account for some of her central events.  I am changing her auto mode to 11 to 20 cm.  She has experienced some irritation with the nasal pillow mask and I have recommended substituting this with the N 30i mask which is under the nose and there are no nasal pillows extending into the nares.  Her blood pressure today is stable and on repeat by me was 128/78.  She continues to be on atenolol 37.5 mg in addition to irbesartan 3 mg daily.  She is tolerating rosuvastatin 20 mg for hyperlipidemia.  Lipid study January 2021 showed an LDL of 94.  She has done well with weight loss from 190 pounds down to 166 pounds.  She is now slightly under the obesity threshold.  She returned to the cardiology care of Dr. Oval Linsey.  I will see her in 1 year for follow-up evaluation.   Medication Adjustments/Labs and Tests Ordered: Current medicines are reviewed at length with the patient today.  Concerns regarding medicines are outlined above.  Medication changes, Labs and Tests ordered today are listed in the Patient Instructions below. Patient Instructions  Medication Instructions:  CONTINUE WITH CURRENT MEDICATIONS. NO CHANGES.  *If you need a refill on your cardiac medications before your next appointment, please call your pharmacy*   Follow-Up: At [facility], you and your health needs are our priority.  As part of our continuing mission to provide  you with exceptional heart care, we have created designated Provider Care Teams.  These Care Teams include your primary Cardiologist (physician) and Advanced Practice Providers (APPs -  Physician Assistants and Nurse Practitioners) who all work together to provide you with the care you need, when you need it.  We recommend signing up for the patient portal called "MyChart".  Sign up information is provided on this After Visit Summary.  MyChart is used to connect with patients for Virtual Visits (Telemedicine).  Patients are able to view lab/test results, encounter notes, upcoming appointments, etc.  Non-urgent messages can be sent to your provider as well.   To learn more about what you can do with MyChart, go to [email or web address].    Your next appointment:   12 month(s)  The format for your next appointment:   In Person  Provider:   Shelva Majestic, MD   Other Instructions Changes made to your machine today New mask- Resmed Airfit N30i     Signed, Shelva Majestic, MD  10/19/2019 3:31 PM    [facility] [address], [place], [place], [place]  [address] Phone: [phone]

## 2019-10-24 NOTE — Progress Notes (Signed)
[facility] PREP Weekly Session   Patient Details  Name: Christine Steele MRN: [id number] Date of Birth: Jun 07, 1950 Age: 69 y.o. PCP: [name], MD  Vitals:   10/24/19 1609  BP: 122/70  Pulse: 63  SpO2: 99%  Weight: 166 lb 6.4 oz (75.5 kg)     [name] Weekly seesion - 10/24/19 1600      Weekly Session   Topic Discussed Finding support    Minutes exercised this week 390 minutes    Classes attended to date 40          Mentioned her HR has been in the 50's sometimes at home. Takes Atenolol 1.5 tabs at home. Encouraged her to reach to the MD and ask if that needs to be changed. Tolerating exercise.   Grateful for: being able to move, blood pressure, heart rate stable.  Nutrition celebration: still eating without sugar/salt. Watching those hiddgen Barriers/Struggles: still a struggle to prepare food    Barnett Hatter 10/24/2019, 4:11 PM

## 2019-10-27 ENCOUNTER — Ambulatory Visit (INDEPENDENT_AMBULATORY_CARE_PROVIDER_SITE_OTHER): Payer: Medicare Other | Admitting: Obstetrics & Gynecology

## 2019-10-27 ENCOUNTER — Other Ambulatory Visit: Payer: Self-pay

## 2019-10-27 ENCOUNTER — Encounter: Payer: Self-pay | Admitting: Obstetrics & Gynecology

## 2019-10-27 VITALS — BP 122/80

## 2019-10-27 DIAGNOSIS — Z09 Encounter for follow-up examination after completed treatment for conditions other than malignant neoplasm: Secondary | ICD-10-CM | POA: Diagnosis not present

## 2019-10-27 DIAGNOSIS — N840 Polyp of corpus uteri: Secondary | ICD-10-CM | POA: Diagnosis not present

## 2019-10-27 NOTE — Progress Notes (Signed)
    Christine Steele 1950/09/28 [id number]        69 y.o.  G2P0011   RP:  Postop hysteroscopy with MyoSure excision and D&C on October 03, 2019  HPI: Very good postop healing with no abdominal pelvic pain, no vaginal bleeding and no vaginal discharge.  No fever.  Urine and bowel movements normal.   OB History  Gravida Para Term Preterm AB Living  2 1     1 1   SAB TAB Ectopic Multiple Live Births  1            # Outcome Date GA Lbr Len/2nd Weight Sex Delivery Anes PTL Lv  2 SAB           1 Para             Past medical history,surgical history, problem list, medications, allergies, family history and social history were all reviewed and documented in the EPIC chart.   Directed ROS with pertinent positives and negatives documented in the history of present illness/assessment and plan.  Exam:  Vitals:   10/27/19 1427  BP: 122/80   General appearance:  Normal  Abdomen: Normal  Gynecologic exam: Vulva normal.  Bimanual exam:  Uterus AV, mobile, NT, normal size.  No adnexal mass, NT bilaterally.  FINAL MICROSCOPIC DIAGNOSIS:   A. ENDOMETRIUM, POLYP, CURETTAGE:  - Benign endometrial polyp  - No hyperplasia or malignancy identified  - See comment    Assessment/Plan:  69 y.o. G2P0011   1. Status post gynecological surgery, follow-up exam Very good postop healing with no complication.  Pathology benign endometrial polyp.  Patient reassured.  Follow-up when due for annual gynecologic exam.  [name] MD, 2:38 PM 10/27/2019

## 2019-10-28 ENCOUNTER — Ambulatory Visit: Payer: Medicare Other | Admitting: Obstetrics & Gynecology

## 2019-11-07 NOTE — Progress Notes (Signed)
[facility] PREP Weekly Session   Patient Details  Name: Christine Steele MRN: [id number] Date of Birth: [birth year] Age: 69 y.o. PCP: [name], MD  Vitals:   11/07/19 1614  BP: 120/68  Pulse: 65  Weight: 165 lb 8 oz (75.1 kg)     [name] Weekly seesion - 11/07/19 1600      Weekly Session   Topic Discussed Hitting roadblocks    Minutes exercised this week 440 minutes    Classes attended to date 24          Grateful for: able to graduate from sit down bike to elliptical. Able to stay on for 30 min when before could stand only for two min.  Nutrition celebration: beginning to prep foods ahead of time.  Barriers/struggles: have a problem doing fun things. Always thinking about what I can do next. (work a Insurance claims handler)    Christine Steele 11/07/2019, 4:15 PM

## 2019-11-14 NOTE — Progress Notes (Signed)
[facility] Report   Patient Details  Name: Christine Steele MRN: [id number] Date of Birth: 15-Apr-1951 Age: 69 y.o. PCP: [name], MD  Vitals:   11/14/19 1641  BP: 122/82  Pulse: 75  SpO2: 99%  Weight: 165 lb (74.8 kg)      Past Medical History:  Diagnosis Date  . Aneurysm (HCC)    102mm Right MCA  . Arthritis   . Cataract    Bilateral  . Chronic kidney disease    Dr. Shelia Media (PCP)   . Hypertension   . OSA on CPAP    havent used CPAP in long time per patient due to unable to get filters to change out  . Osteopenia 01/2017   T score -1.4 FRAX 3.4% / 0.4%  . Tachycardia   . Vitamin D deficiency    Past Surgical History:  Procedure Laterality Date  . BIOPSY BREAST  1970   Right Breast  . COLONOSCOPY    . DILATATION & CURETTAGE/HYSTEROSCOPY WITH MYOSURE N/A 03/06/2017   Procedure: DILATATION & CURETTAGE/HYSTEROSCOPY WITH MYOSURE;  Surgeon: [name], MD;  Location: [facility];  Service: Gynecology;  Laterality: N/A;  Request 7:30am in [place] [facility] block time  requests one hour  . DILATATION & CURETTAGE/HYSTEROSCOPY WITH MYOSURE N/A 10/03/2019   Procedure: DILATATION & CURETTAGE/HYSTEROSCOPY WITH MYOSURE;  Surgeon: [name], MD;  Location: [facility];  Service: Gynecology;  Laterality: N/A;  request 8:30am OR time in [place] [facility] block requests 45 minutes  . right lumpectomy  1970   benign   Social History   Tobacco Use  Smoking Status Former Smoker  . Packs/day: 1.00  . Years: 5.00  . Pack years: 5.00  . Types: Cigarettes  . Quit date: 04/22/1979  . Years since quitting: 40.5  Smokeless Tobacco Never Used       Barnett Hatter 11/14/2019, 4:45 PM

## 2019-12-05 DIAGNOSIS — Z5181 Encounter for therapeutic drug level monitoring: Secondary | ICD-10-CM | POA: Diagnosis not present

## 2019-12-05 DIAGNOSIS — I10 Essential (primary) hypertension: Secondary | ICD-10-CM | POA: Diagnosis not present

## 2019-12-06 ENCOUNTER — Other Ambulatory Visit: Payer: Self-pay

## 2019-12-06 ENCOUNTER — Ambulatory Visit (INDEPENDENT_AMBULATORY_CARE_PROVIDER_SITE_OTHER): Payer: Medicare Other | Admitting: Cardiovascular Disease

## 2019-12-06 ENCOUNTER — Encounter: Payer: Self-pay | Admitting: Cardiovascular Disease

## 2019-12-06 VITALS — BP 130/64 | HR 64 | Ht 63.0 in | Wt 163.0 lb

## 2019-12-06 DIAGNOSIS — I10 Essential (primary) hypertension: Secondary | ICD-10-CM | POA: Diagnosis not present

## 2019-12-06 DIAGNOSIS — E7800 Pure hypercholesterolemia, unspecified: Secondary | ICD-10-CM

## 2019-12-06 DIAGNOSIS — G4733 Obstructive sleep apnea (adult) (pediatric): Secondary | ICD-10-CM

## 2019-12-06 LAB — COMPREHENSIVE METABOLIC PANEL
ALT: 27 IU/L (ref 0–32)
AST: 20 IU/L (ref 0–40)
Albumin/Globulin Ratio: 1.8 (ref 1.2–2.2)
Albumin: 4.1 g/dL (ref 3.8–4.8)
Alkaline Phosphatase: 124 IU/L — ABNORMAL HIGH (ref 48–121)
BUN/Creatinine Ratio: 14 (ref 12–28)
BUN: 15 mg/dL (ref 8–27)
Bilirubin Total: 0.6 mg/dL (ref 0.0–1.2)
CO2: 22 mmol/L (ref 20–29)
Calcium: 9 mg/dL (ref 8.7–10.3)
Chloride: 105 mmol/L (ref 96–106)
Creatinine, Ser: 1.07 mg/dL — ABNORMAL HIGH (ref 0.57–1.00)
GFR calc Af Amer: 62 mL/min/{1.73_m2} (ref >59)
GFR calc non Af Amer: 53 mL/min/{1.73_m2} — ABNORMAL LOW (ref >59)
Globulin, Total: 2.3 g/dL (ref 1.5–4.5)
Glucose: 78 mg/dL (ref 65–99)
Potassium: 4.5 mmol/L (ref 3.5–5.2)
Sodium: 139 mmol/L (ref 134–144)
Total Protein: 6.4 g/dL (ref 6.0–8.5)

## 2019-12-06 LAB — LIPID PANEL
Chol/HDL Ratio: 1.9 ratio (ref 0.0–4.4)
Cholesterol, Total: 127 mg/dL (ref 100–199)
HDL: 67 mg/dL (ref >39)
LDL Chol Calc (NIH): 50 mg/dL (ref 0–99)
Triglycerides: 42 mg/dL (ref 0–149)
VLDL Cholesterol Cal: 10 mg/dL (ref 5–40)

## 2019-12-06 NOTE — Progress Notes (Signed)
Cardiology Office Note   Date:  12/06/2019   ID:  Madason, Rauls 1951/04/11, MRN [id number]  Patient Location: Home Provider Location: Office  PCP:  [name], MD  Cardiologist:  Skeet Latch, MD  Electrophysiologist:  None   Evaluation Performed:  Follow-Up Visit  Chief Complaint:  hypertension  History of Present Illness:    RAYA MCKINSTRY is a 69 y.o. female with moderate CAD, hypertension, brain aneurysm, and OSA on CPAP here for follow up.  She was initially seen 03/2018 for chest tightness.  Ms. Haros reported episodes of chest discomfort associated with L arm weakness.  In general she has been feeling weak and tired.  The chest discomfort comes and goes.  She first noticed it after she switched from bisoprolol to atenolol.  The pain is up to 8/10 in severity and is not associated with shortness of breath, nausea or diaphoresis.  She was referred for a Lexiscan Myoview 03/2018 that was negative for ischemia.  She continued to have atypical symptoms and had a coronary CT-A 06/2019 that showed moderate LAD disease.  Calcium score was 98th percentile.    Ms. Petko has been experiencing spells of feeling weak and then radiating into her chest and left side.  Her PCP stopped HCTZ to see if that helped.  She is unsure whether this helped.  Her BP became elevated.  Losartan was switched to irbesartan.  Since her last appointment she was seen in the ED 09/20/2019.  She reported systolic blood pressures in the 160s.  She has been participating in the PREP program to the [facility].  Her blood pressures are noted to be high there as well.  Hydralazine was added to her regimen 08/2019.  At first it didn't seem to help.  Lately her BP has been better.  However she then started feeling very tired and febrile.  Her BP was elevated at night and then her BP and heart rate went up.  She went to Urgent Care and was negative for COVID.  She followed up with Dr. Shelia Media and hydralazine was stopped.  Atenolol  was increased to 50mg .  Now her BP has been running low from 80s-110s/50-60s.  She is feeling better but tired.  She has no chest pain and her breathing is stable.    She completed the PREP program at the [facility].  She signed up to go to the [facility] at the end and still goes three days per week.  She wants to start going more often. She does cardio and weight.  She feels good with exercise.  She notes that her PCP has been running within the normal range.  It is usually in the 120s or lower.  In the last week she has been feeling weak at times.  She thinks it may be been due to her diet.  She started eating more sugar in her diet.  At her last appointment her lipids were checked and her LFTs were mildly elevated.  Rosuvastatin was reduced to 10mg  and her numbers improved.  She lost 30 lb since the beginning of the year.   Past Medical History:  Diagnosis Date  . Aneurysm (HCC)    32mm Right MCA  . Arthritis   . Cataract    Bilateral  . Chronic kidney disease    Dr. Shelia Media (PCP)   . Hypertension   . OSA on CPAP    havent used CPAP in long time per patient due to unable to get filters  to change out  . Osteopenia 01/2017   T score -1.4 FRAX 3.4% / 0.4%  . Tachycardia   . Vitamin D deficiency     Past Surgical History:  Procedure Laterality Date  . BIOPSY BREAST  1970   Right Breast  . COLONOSCOPY    . DILATATION & CURETTAGE/HYSTEROSCOPY WITH MYOSURE N/A 03/06/2017   Procedure: DILATATION & CURETTAGE/HYSTEROSCOPY WITH MYOSURE;  Surgeon: [name], MD;  Location: [place];  Service: Gynecology;  Laterality: N/A;  Request 7:30am in [place] [facility] block time  requests one hour  . DILATATION & CURETTAGE/HYSTEROSCOPY WITH MYOSURE N/A 10/03/2019   Procedure: DILATATION & CURETTAGE/HYSTEROSCOPY WITH MYOSURE;  Surgeon: [name], MD;  Location: [place];  Service: Gynecology;  Laterality: N/A;  request 8:30am OR time in [place] [facility] block requests  45 minutes  . right lumpectomy  1970   benign     Current Outpatient Medications  Medication Sig Dispense Refill  . Ascorbic Acid (VITAMIN C PO) Take by mouth daily.    Marland Kitchen aspirin EC 81 MG tablet Take 81 mg by mouth daily.     Marland Kitchen atenolol (TENORMIN) 25 MG tablet Take 1.5 tablets (37.5 mg total) by mouth daily. 135 tablet 3  . calcium-vitamin D 250-100 MG-UNIT tablet Take 1 tablet by mouth 2 (two) times daily.    . Cholecalciferol (VITAMIN D PO) Take 1,000 Units by mouth.     . irbesartan (AVAPRO) 300 MG tablet Take 300 mg by mouth daily.    . Multiple Vitamin (MULTIVITAMIN WITH MINERALS) TABS tablet Take 1 tablet by mouth daily.    . rosuvastatin (CRESTOR) 20 MG tablet Take 20 mg by mouth as directed. Take 1/2 tablet daily     No current facility-administered medications for this visit.    Allergies:   Amlodipine, Chocolate, Coconut fatty acids, Hydralazine, Other, and Peanut-containing drug products    Social History:  The patient  reports that she quit smoking about 40 years ago. Her smoking use included cigarettes. She has a 5.00 pack-year smoking history. She has never used smokeless tobacco. She reports that she does not drink alcohol and does not use drugs.   Family History:  The patient's family history includes Heart attack in her father; Hypertension in her mother; Stroke in her father.    ROS:  Please see the history of present illness.   Otherwise, review of systems are positive for none.   All other systems are reviewed and negative.    PHYSICAL EXAM: VS:  BP 130/64   Pulse 64   Ht 5\' 3"  (1.6 m)   Wt 163 lb (73.9 kg)   BMI 28.87 kg/m  , BMI Body mass index is 28.87 kg/m. GENERAL:  Well appearing HEENT: Pupils equal round and reactive, fundi not visualized, oral mucosa unremarkable NECK:  No jugular venous distention, waveform within normal limits, carotid upstroke brisk and symmetric, no bruits, no thyromegaly LYMPHATICS:  No cervical adenopathy LUNGS:  Clear to  auscultation bilaterally HEART:  RRR.  PMI not displaced or sustained,S1 and S2 within normal limits, no S3, no S4, no clicks, no rubs, no murmurs ABD:  Flat, positive bowel sounds normal in frequency in pitch, no bruits, no rebound, no guarding, no midline pulsatile mass, no hepatomegaly, no splenomegaly EXT:  2 plus pulses throughout, no edema, no cyanosis no clubbing SKIN:  No rashes no nodules NEURO:  Cranial nerves II through XII grossly intact, motor grossly intact throughout PSYCH:  Cognitively intact, oriented to  person place and time  EKG:  EKG is not ordered today. 05/04/19: Sinus bradycardia.  Rate 57 bpm.   Lexiscan Myoview  04/02/18:  Nuclear stress EF: 52%. The left ventricular ejection fraction is mildly decreased (45-54%).  T wave inversion was noted during stress in the V4, V5, aVF and V3 leads.  This is a low risk study. No evidence of ischemia. No previous MI  The study is normal.    Recent Labs: 09/29/2019: Hemoglobin 11.8; Platelets 295 12/05/2019: ALT 27; BUN 15; Creatinine, Ser 1.07; Potassium 4.5; Sodium 139    Lipid Panel    Component Value Date/Time   CHOL 127 12/05/2019 1257   TRIG 42 12/05/2019 1257   HDL 67 12/05/2019 1257   CHOLHDL 1.9 12/05/2019 1257   CHOLHDL 2.6 10/28/2009 0455   VLDL 11 10/28/2009 0455   LDLCALC 50 12/05/2019 1257   LDLDIRECT 121.6 10/13/2006 1729      Wt Readings from Last 3 Encounters:  12/06/19 163 lb (73.9 kg)  11/14/19 165 lb (74.8 kg)  11/07/19 165 lb 8 oz (75.1 kg)      ASSESSMENT AND PLAN:  # Moderate CAD:  # Hyperlipidemia: Lexiscan Myoview was negative for ischemia.  She continued to have atypical symptoms and had a coronary CT-A 06/2019 that showed moderate LAD disease.  Calcium score was 98th percentile.  She is asymptomatic and exercising without symptoms.  Continue aspirin and rosuvastaitn.  Lipids well-controlled.  Continue exercise.  # OSA: Encouraged continued CPAP usage.  # Hypertension: BP  labile. Continue atenolol and irbesartan.  # Brain aneurysm: Continue aspirin.   Current medicines are reviewed at length with the patient today.  The patient does not have concerns regarding medicines.  The following changes have been made:  Reduced atenolol to 37.5mg  daily.  Labs/ tests ordered today include:   No orders of the defined types were placed in this encounter.    Disposition:   FU with Corbin Hott C. Oval Linsey, MD, [facility] in 1 year.   Signed, Abisola Carrero C. Oval Linsey, MD, [facility]  12/06/2019 6:11 PM    [place]

## 2019-12-06 NOTE — Patient Instructions (Signed)
Medication Instructions:  DECREASE YOUR ASPIRIN TO 81 MG DAILY   *If you need a refill on your cardiac medications before your next appointment, please call your pharmacy*  Lab Work: NONE  Testing/Procedures: NONE  Follow-Up: At [facility], you and your health needs are our priority.  As part of our continuing mission to provide you with exceptional heart care, we have created designated Provider Care Teams.  These Care Teams include your primary Cardiologist (physician) and Advanced Practice Providers (APPs -  Physician Assistants and Nurse Practitioners) who all work together to provide you with the care you need, when you need it.  We recommend signing up for the patient portal called "MyChart".  Sign up information is provided on this After Visit Summary.  MyChart is used to connect with patients for Virtual Visits (Telemedicine).  Patients are able to view lab/test results, encounter notes, upcoming appointments, etc.  Non-urgent messages can be sent to your provider as well.   To learn more about what you can do with MyChart, go to [email or web address].    Your next appointment:   12 month(s) You will receive a reminder letter in the mail two months in advance. If you don't receive a letter, please call our office to schedule the follow-up appointment.  The format for your next appointment:   In Person  Provider:   You may see Skeet Latch, MD or one of the following Advanced Practice Providers on your designated Care Team:    Kerin Ransom, PA-C  [place], [place]  Coletta Memos, [place]

## 2019-12-28 DIAGNOSIS — R7989 Other specified abnormal findings of blood chemistry: Secondary | ICD-10-CM | POA: Diagnosis not present

## 2019-12-28 DIAGNOSIS — R945 Abnormal results of liver function studies: Secondary | ICD-10-CM | POA: Diagnosis not present

## 2020-01-30 ENCOUNTER — Other Ambulatory Visit: Payer: Self-pay | Admitting: Cardiovascular Disease

## 2020-01-30 NOTE — Telephone Encounter (Signed)
Rx has been sent to the pharmacy electronically. ° °

## 2020-02-07 DIAGNOSIS — E7800 Pure hypercholesterolemia, unspecified: Secondary | ICD-10-CM | POA: Diagnosis not present

## 2020-02-27 DIAGNOSIS — E559 Vitamin D deficiency, unspecified: Secondary | ICD-10-CM | POA: Diagnosis not present

## 2020-02-27 DIAGNOSIS — E7800 Pure hypercholesterolemia, unspecified: Secondary | ICD-10-CM | POA: Diagnosis not present

## 2020-02-27 DIAGNOSIS — I10 Essential (primary) hypertension: Secondary | ICD-10-CM | POA: Diagnosis not present

## 2020-02-27 DIAGNOSIS — R748 Abnormal levels of other serum enzymes: Secondary | ICD-10-CM | POA: Diagnosis not present

## 2020-05-09 ENCOUNTER — Emergency Department (HOSPITAL_COMMUNITY)
Admission: EM | Admit: 2020-05-09 | Discharge: 2020-05-10 | Disposition: A | Discharge status: Home / Self Care | Payer: Medicare Other | Attending: Emergency Medicine | Admitting: Emergency Medicine

## 2020-05-09 ENCOUNTER — Other Ambulatory Visit: Payer: Self-pay

## 2020-05-09 ENCOUNTER — Encounter (HOSPITAL_COMMUNITY): Payer: Self-pay

## 2020-05-09 DIAGNOSIS — K429 Umbilical hernia without obstruction or gangrene: Secondary | ICD-10-CM | POA: Diagnosis not present

## 2020-05-09 DIAGNOSIS — R079 Chest pain, unspecified: Secondary | ICD-10-CM

## 2020-05-09 DIAGNOSIS — N189 Chronic kidney disease, unspecified: Secondary | ICD-10-CM | POA: Diagnosis not present

## 2020-05-09 DIAGNOSIS — R140 Abdominal distension (gaseous): Secondary | ICD-10-CM | POA: Diagnosis not present

## 2020-05-09 DIAGNOSIS — Z79899 Other long term (current) drug therapy: Secondary | ICD-10-CM | POA: Diagnosis not present

## 2020-05-09 DIAGNOSIS — I10 Essential (primary) hypertension: Secondary | ICD-10-CM

## 2020-05-09 DIAGNOSIS — Z87891 Personal history of nicotine dependence: Secondary | ICD-10-CM | POA: Diagnosis not present

## 2020-05-09 DIAGNOSIS — Z7982 Long term (current) use of aspirin: Secondary | ICD-10-CM | POA: Diagnosis not present

## 2020-05-09 DIAGNOSIS — Z91010 Allergy to peanuts: Secondary | ICD-10-CM | POA: Diagnosis not present

## 2020-05-09 DIAGNOSIS — M4699 Unspecified inflammatory spondylopathy, multiple sites in spine: Secondary | ICD-10-CM | POA: Diagnosis not present

## 2020-05-09 DIAGNOSIS — I129 Hypertensive chronic kidney disease with stage 1 through stage 4 chronic kidney disease, or unspecified chronic kidney disease: Secondary | ICD-10-CM | POA: Insufficient documentation

## 2020-05-09 DIAGNOSIS — R1012 Left upper quadrant pain: Secondary | ICD-10-CM | POA: Diagnosis present

## 2020-05-09 DIAGNOSIS — D259 Leiomyoma of uterus, unspecified: Secondary | ICD-10-CM | POA: Diagnosis not present

## 2020-05-09 DIAGNOSIS — R1013 Epigastric pain: Secondary | ICD-10-CM

## 2020-05-09 LAB — URINALYSIS, ROUTINE W REFLEX MICROSCOPIC
Bilirubin Urine: NEGATIVE
Glucose, UA: NEGATIVE mg/dL
Hgb urine dipstick: NEGATIVE
Ketones, ur: NEGATIVE mg/dL
Leukocytes,Ua: NEGATIVE
Nitrite: NEGATIVE
Protein, ur: NEGATIVE mg/dL
Specific Gravity, Urine: 1.003 — ABNORMAL LOW (ref 1.005–1.030)
pH: 6 (ref 5.0–8.0)

## 2020-05-09 LAB — LIPASE, BLOOD: Lipase: 50 U/L (ref 11–51)

## 2020-05-09 LAB — CBC
HCT: 40.9 % (ref 36.0–46.0)
Hemoglobin: 13 g/dL (ref 12.0–15.0)
MCH: 30.1 pg (ref 26.0–34.0)
MCHC: 31.8 g/dL (ref 30.0–36.0)
MCV: 94.7 fL (ref 80.0–100.0)
Platelets: 205 10*3/uL (ref 150–400)
RBC: 4.32 MIL/uL (ref 3.87–5.11)
RDW: 13.2 % (ref 11.5–15.5)
WBC: 4.5 10*3/uL (ref 4.0–10.5)
nRBC: 0 % (ref 0.0–0.2)

## 2020-05-09 LAB — COMPREHENSIVE METABOLIC PANEL
ALT: 28 U/L (ref 0–44)
AST: 26 U/L (ref 15–41)
Albumin: 3.9 g/dL (ref 3.5–5.0)
Alkaline Phosphatase: 97 U/L (ref 38–126)
Anion gap: 9 (ref 5–15)
BUN: 17 mg/dL (ref 8–23)
CO2: 26 mmol/L (ref 22–32)
Calcium: 9 mg/dL (ref 8.9–10.3)
Chloride: 105 mmol/L (ref 98–111)
Creatinine, Ser: 1.14 mg/dL — ABNORMAL HIGH (ref 0.44–1.00)
GFR, Estimated: 52 mL/min — ABNORMAL LOW (ref >60)
Glucose, Bld: 88 mg/dL (ref 70–99)
Potassium: 3.9 mmol/L (ref 3.5–5.1)
Sodium: 140 mmol/L (ref 135–145)
Total Bilirubin: 0.5 mg/dL (ref 0.3–1.2)
Total Protein: 6.6 g/dL (ref 6.5–8.1)

## 2020-05-09 NOTE — ED Triage Notes (Signed)
Patient reports she started with abdominal pain tonight that radiates into her back, reports now it is more epigastric/ chest pain, endorses she has had a lot of gas and burping, called insurance triage line and they told her to come to the ER.

## 2020-05-10 ENCOUNTER — Emergency Department (HOSPITAL_COMMUNITY): Payer: Medicare Other

## 2020-05-10 DIAGNOSIS — M4699 Unspecified inflammatory spondylopathy, multiple sites in spine: Secondary | ICD-10-CM | POA: Diagnosis not present

## 2020-05-10 DIAGNOSIS — K429 Umbilical hernia without obstruction or gangrene: Secondary | ICD-10-CM | POA: Diagnosis not present

## 2020-05-10 DIAGNOSIS — R1013 Epigastric pain: Secondary | ICD-10-CM | POA: Diagnosis not present

## 2020-05-10 DIAGNOSIS — R140 Abdominal distension (gaseous): Secondary | ICD-10-CM | POA: Diagnosis not present

## 2020-05-10 DIAGNOSIS — D259 Leiomyoma of uterus, unspecified: Secondary | ICD-10-CM | POA: Diagnosis not present

## 2020-05-10 LAB — TROPONIN I (HIGH SENSITIVITY): Troponin I (High Sensitivity): 6 ng/L (ref <18)

## 2020-05-10 MED ORDER — PANTOPRAZOLE SODIUM 20 MG PO TBEC
20.0000 mg | DELAYED_RELEASE_TABLET | Freq: Once | ORAL | Status: AC
  Administered 2020-05-10: 20 mg via ORAL
  Filled 2020-05-10: qty 1

## 2020-05-10 MED ORDER — CLONIDINE HCL 0.1 MG PO TABS
0.1000 mg | ORAL_TABLET | Freq: Once | ORAL | Status: AC
  Administered 2020-05-10: 0.1 mg via ORAL
  Filled 2020-05-10: qty 1

## 2020-05-10 MED ORDER — PANTOPRAZOLE SODIUM 20 MG PO TBEC: 20.0000 mg | DELAYED_RELEASE_TABLET | Freq: Every day | ORAL | 0 refills | Status: DC

## 2020-05-10 NOTE — Discharge Instructions (Addendum)
1.  Go back to taking your atenolol 1-1/2 tablets/day.  Monitor your blood pressure and your heart rate.  If your blood pressure remains greater than 374 systolic and your heart rate remains 60 or greater, go to 2 tablets daily. 2.  Start taking Protonix daily as prescribed.  Follow dietary instructions for reflux. 3.  Follow-up with your doctor soon as possible.  Review your blood pressure management and possible continued outpatient evaluation for chest pain.  At this time, your chest pain does not appear to be due to heart attack.  You have had normal heart labs and EKG without heart attack pattern. HOWEVER, if your symptoms are worsening, changing or new and concerning symptoms develop, return to the emergency department for recheck.

## 2020-05-10 NOTE — ED Provider Notes (Signed)
[place] EMERGENCY DEPARTMENT Provider Note   CSN: [id number] Arrival date & time: 05/09/20  1948     History Chief Complaint  Patient presents with  . Abdominal Pain    Christine Steele is a 70 y.o. female.  HPI Patient reports that she developed abdominal pain yesterday.  She noted she had pain in her left upper and mid quadrant.  She reports this seemed to migrate into her central\epigastric area.  She then perceived pain radiating into her chest and central back.  She reports she has had normal bowel movements.  She has not been having constipation.  She has not had vomiting or diarrhea.  Patient reports she does have problems with high blood pressure.  Typically she reports her blood pressures are controlled in the 120s over 70s range.  She reports yesterday and the day before her blood pressures were as high as the 710G systolic.  She reports taking her blood pressure medication this morning at about 8 AM.  Patient reports that she has a small intracerebral aneurysm that gets monitored.  She reports she is never had to have interventional treatment for it.  She has no history of abdominal surgeries.    Past Medical History:  Diagnosis Date  . Aneurysm (HCC)    64mm Right MCA  . Arthritis   . Cataract    Bilateral  . Chronic kidney disease    Dr. Shelia Media (PCP)   . Hypertension   . OSA on CPAP    havent used CPAP in long time per patient due to unable to get filters to change out  . Osteopenia 01/2017   T score -1.4 FRAX 3.4% / 0.4%  . Tachycardia   . Vitamin D deficiency     Patient Active Problem List   Diagnosis Date Noted  . 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 OSA (obstructive sleep apnea) 03/31/2018  . Atypical chest pain 03/31/2018  . Weakness 05/26/2015  . Pure hypercholesterolemia 04/26/2007  . ANEMIA-IRON DEFICIENCY 04/26/2007  . ANXIETY 04/26/2007  . GERD 04/26/2007  . LEG PAIN,  RIGHT 04/26/2007  . ANEMIA 02/10/2007  . Essential hypertension 02/10/2007  . ALLERGY 02/10/2007    Past Surgical History:  Procedure Laterality Date  . BIOPSY BREAST  1970   Right Breast  . COLONOSCOPY    . DILATATION & CURETTAGE/HYSTEROSCOPY WITH MYOSURE N/A 03/06/2017   Procedure: DILATATION & CURETTAGE/HYSTEROSCOPY WITH MYOSURE;  Surgeon: [name], MD;  Location: [place];  Service: Gynecology;  Laterality: N/A;  Request 7:30am in [place] [facility] block time  requests one hour  . DILATATION & CURETTAGE/HYSTEROSCOPY WITH MYOSURE N/A 10/03/2019   Procedure: DILATATION & CURETTAGE/HYSTEROSCOPY WITH MYOSURE;  Surgeon: [name], MD;  Location: [place];  Service: Gynecology;  Laterality: N/A;  request 8:30am OR time in [place] [facility] block requests 45 minutes  . right lumpectomy  1970   benign     OB History    Gravida  2   Para  1   Term      Preterm      AB  1   Living  1     SAB  1   IAB      Ectopic      Multiple      Live Births              Family History  Problem Relation Age of Onset  . Hypertension  Mother   . Heart attack Father   . Stroke Father   . Aneurysm Neg Hx     Social History   Tobacco Use  . Smoking status: Former Smoker    Packs/day: 1.00    Years: 5.00    Pack years: 5.00    Types: Cigarettes    Quit date: 04/22/1979    Years since quitting: 41.0  . Smokeless tobacco: Never Used  Vaping Use  . Vaping Use: Never used  Substance Use Topics  . Alcohol use: No    Alcohol/week: 0.0 standard drinks  . Drug use: No    Home Medications Prior to Admission medications   Medication Sig Start Date End Date Taking? Authorizing Provider  Ascorbic Acid (VITAMIN C PO) Take 1 tablet by mouth daily.   Yes [provider]  aspirin EC 81 MG tablet Take 81 mg by mouth daily.    Yes [provider]  atenolol (TENORMIN) 25 MG tablet Take 1.5 tablets (37.5 mg total) by  mouth daily. Patient taking differently: Take 37.5 mg by mouth every evening. 09/27/19  Yes Skeet Latch, MD  calcium-vitamin D 250-100 MG-UNIT tablet Take 1 tablet by mouth 2 (two) times daily.   Yes [provider]  irbesartan (AVAPRO) 300 MG tablet Take 300 mg by mouth daily.   Yes [provider]  Multiple Vitamin (MULTIVITAMIN WITH MINERALS) TABS tablet Take 1 tablet by mouth daily.   Yes [provider]  pantoprazole (PROTONIX) 20 MG tablet Take 1 tablet (20 mg total) by mouth daily. 05/10/20  Yes Nachelle Negrette, Jeannie Done, MD  rosuvastatin (CRESTOR) 20 MG tablet TAKE 1 TABLET BY MOUTH EVERY DAY Patient taking differently: Take 10 mg by mouth every evening. 01/30/20  Yes Skeet Latch, MD    Allergies    Amlodipine, Chocolate, Coconut fatty acids, Hydralazine, Other, and Peanut-containing drug products  Review of Systems   Review of Systems 10 systems reviewed and negative except as per HPI Physical Exam Updated Vital Signs BP (!) 170/83 (BP Location: Right Arm)   Pulse 67   Temp 97.8 F (36.6 C) (Oral)   Resp 12   Ht 5\' 3"  (1.6 m)   Wt 74.8 kg   SpO2 100%   BMI 29.23 kg/m   Physical Exam Constitutional:      Appearance: She is well-developed and well-nourished.  HENT:     Head: Normocephalic and atraumatic.  Eyes:     Extraocular Movements: EOM normal.     Pupils: Pupils are equal, round, and reactive to light.  Cardiovascular:     Rate and Rhythm: Normal rate and regular rhythm.     Pulses: Intact distal pulses.     Heart sounds: Normal heart sounds.  Pulmonary:     Effort: Pulmonary effort is normal.     Breath sounds: Normal breath sounds.  Abdominal:     General: Bowel sounds are normal. There is no distension.     Palpations: Abdomen is soft.     Tenderness: There is abdominal tenderness.     Comments: Epigastric tenderness to palpation.  Left lower quadrant tender to palpation.  No guarding  Musculoskeletal:        General: No  swelling, tenderness or edema. Normal range of motion.     Cervical back: Neck supple.     Right lower leg: No edema.     Left lower leg: No edema.  Skin:    General: Skin is warm, dry and intact.  Neurological:  Mental Status: She is alert and oriented to person, place, and time.     GCS: GCS eye subscore is 4. GCS verbal subscore is 5. GCS motor subscore is 6.     Coordination: Coordination normal.     Deep Tendon Reflexes: Strength normal.  Psychiatric:        Mood and Affect: Mood and affect and mood normal.     ED Results / Procedures / Treatments   Labs (all labs ordered are listed, but only abnormal results are displayed) Labs Reviewed  COMPREHENSIVE METABOLIC PANEL - Abnormal; Notable for the following components:      Result Value   Creatinine, Ser 1.14 (*)    GFR, Estimated 52 (*)    All other components within normal limits  URINALYSIS, ROUTINE W REFLEX MICROSCOPIC - Abnormal; Notable for the following components:   Color, Urine COLORLESS (*)    Specific Gravity, Urine 1.003 (*)    All other components within normal limits  LIPASE, BLOOD  CBC  TROPONIN I (HIGH SENSITIVITY)    EKG EKG Interpretation  Date/Time:  Wednesday May 09 2020 21:42:44 EST Ventricular Rate:  60 PR Interval:  144 QRS Duration: 78 QT Interval:  424 QTC Calculation: 424 R Axis:   13 Text Interpretation: Normal sinus rhythm Normal ECG No significant change since last tracing Confirmed by Melene Plan [id number]) on 05/11/2020 8:46:00 AM   Radiology CT ABDOMEN PELVIS WO CONTRAST  Result Date: 05/10/2020 CLINICAL DATA:  Abdominal distension and epigastric pain. EXAM: CT ABDOMEN AND PELVIS WITHOUT CONTRAST TECHNIQUE: Multidetector CT imaging of the abdomen and pelvis was performed following the standard protocol without IV contrast. COMPARISON:  None FINDINGS: Lower chest: No acute abnormality. Hepatobiliary: No focal liver abnormality is seen. No gallstones, gallbladder wall thickening, or  biliary dilatation. Pancreas: Unremarkable. No pancreatic ductal dilatation or surrounding inflammatory changes. Spleen: Normal in size without focal abnormality. Adrenals/Urinary Tract: Normal adrenal glands. No kidney stones, hydronephrosis or mass identified. Urinary bladder is unremarkable. Stomach/Bowel: Stomach is normal. The appendix is visualized and appears normal. No bowel wall thickening, inflammation or distension identified. Vascular/Lymphatic: Aortic atherosclerosis. No aneurysm. No abdominopelvic adenopathy identified. Reproductive: Calcified and noncalcified uterine fibroids are identified. Cyst within the left ovary the measures 3.3 cm and 12.6 Hounsfield units, image 63/3. Other: No free fluid or fluid collections. Fat containing umbilical hernia measures 2.7 x 1.4 cm. Musculoskeletal: No acute or suspicious osseous findings. Facet arthropathy is identified at L4-5 and L5-S1. L5-S1 degenerative disc disease is noted. IMPRESSION: 1. No acute findings identified within the abdomen or pelvis. 2. Uterine fibroids. 3. Fat containing umbilical hernia. 4. Aortic atherosclerosis. Aortic Atherosclerosis (ICD10-I70.0). Electronically Signed   By: Signa Kell M.D.   On: 05/10/2020 14:09    Procedures Procedures (including critical care time)  Medications Ordered in ED Medications  pantoprazole (PROTONIX) EC tablet 20 mg (20 mg Oral Given 05/10/20 1510)  cloNIDine (CATAPRES) tablet 0.1 mg (0.1 mg Oral Given 05/10/20 1510)    ED Course  I have reviewed the triage vital signs and the nursing notes.  Pertinent labs & imaging results that were available during my care of the patient were reviewed by me and considered in my medical decision making (see chart for details).    MDM Rules/Calculators/A&P                          Patient presents with some migratory abdominal discomfort.  First she noted a masslike area in  the left upper quadrant that seem to migrate centrally.  He noted some  radiation of pain into her chest as well.  At this time, EKG and troponin rule out acute ischemic event.  This does sound atypical.  I feel patient is stable for outpatient follow-up with her PCP to determine further diagnostic testing on outpatient basis.  Return precautions for chest pain reviewed.  Symptoms are somewhat suggestive of a source.  Will initiate Protonix with instructions for GERD management included in discharge instructions.  She has chronic essential hypertension.  Pressures remained moderately elevated through most of her ED visit despite having taken her medications.  Discussed various possible medication adjustments given her list of allergies to several antihypertensives.  At this time plan will be for her to go back to the 1-1/2 tablet dosing of her atenolol.  Close follow-up is recommended.  Return precautions reviewed. Final Clinical Impression(s) / ED Diagnoses Final diagnoses:  Epigastric abdominal pain  Chest pain, unspecified type  Essential hypertension    Rx / DC Orders ED Discharge Orders         Ordered    pantoprazole (PROTONIX) 20 MG tablet  Daily        05/10/20 1455           Charlesetta Shanks, MD 05/11/20 1055

## 2020-05-23 ENCOUNTER — Ambulatory Visit (INDEPENDENT_AMBULATORY_CARE_PROVIDER_SITE_OTHER): Payer: Medicare Other | Admitting: Family Medicine

## 2020-05-23 ENCOUNTER — Encounter: Payer: Self-pay | Admitting: Family Medicine

## 2020-05-23 ENCOUNTER — Telehealth: Payer: Self-pay | Admitting: Family Medicine

## 2020-05-23 VITALS — BP 161/78 | HR 53 | Ht 63.0 in | Wt 164.0 lb

## 2020-05-23 DIAGNOSIS — I10 Essential (primary) hypertension: Secondary | ICD-10-CM | POA: Diagnosis not present

## 2020-05-23 DIAGNOSIS — I671 Cerebral aneurysm, nonruptured: Secondary | ICD-10-CM | POA: Diagnosis not present

## 2020-05-23 NOTE — Progress Notes (Addendum)
x  Chief Complaint  Patient presents with  . Follow-up    RM 1 Alone  Pt states she is having no weakness      HISTORY OF PRESENT ILLNESS: 05/23/20 ALL: Christine Steele is a 70 y.o. female here today for follow up for cerebral aneurysm. She reports that she is doing very well. She denies any concerns of numbness or weakness. No dizziness or vision changes. BP has been elevated for about 2 weeks. Normally 120's/80's on atenolol. She was seen in the ER last week for abdominal pain and blood pressure was 171/83. In the office today, it is 161/78. She admits that she had been very careful about sodium intake but has had more thatn usual over the past few weeks. She continues close follow up with PCP for management. Last visit 01/2020. She has follow up next week. She is uncertain if she has taken atenolol this morning because she was rushing to get to our office. She usually takes every morning. Rosuvastatin was decreased to 10mg  daily due to muscle aches. LDL 50 on 12/05/2019.    HISTORY (copied from previous note)  HPI: Christine Steele a 70 y.o.femalehere as a referral from Dr. Shelia Media, she has been seen in the past for a constellation of symptoms such as bilateral weakness, orthostasis due to dehydration, we saw an aneurysm on MRA of the head a few years ago and she is here because she is worried about this. She is still having similar symptoms as before, numbness on the right side, all the way to her feet. The symptoms went away for a while but now having them again but no changes in quality or duration, she still reports stress when that happens or when she doesn't drink  Lot she is on her feet a long time, if he lays down she feels better, no dizziness, not lightheaded, never passed out, she does feel weak with the episodes nd the next day she feels a little funny from it. She also feels it on the left side and feels funny, so unlikely seizures, always with stress. She states she had carotid  Dopplers at Dr. Pennie Banter. She denies dizziness recently but she once had dizziness with the episode. Reviewed labs 04/26/19 BUN 10 Creatinine 1.2. No other focal neurologic deficits, associated symptoms, inciting events or modifiable factors.  MRA head 2017: personally reviewed images and agree with the following 1. No acute intracranial abnormality. 2. Unchanged, minimal cerebral white matter disease which is nonspecific but may reflect the sequelae of chronic small vessel ischemia. 3. Unchanged 2 mm right MCA aneurysm. 4. No major intracranial arterial occlusion or significant stenosis.  I reviewed Dr. Katrine Coho notes, he referred her for follow-up of cerebral aneurysm, intermittent dizziness, feeling funny in the head, so she went to bed, comes on when she gets overwhelmed or under pressure, but overall she does not feel generally anxious, she also had an episode where she felt weak on the right side and her face felt funny in the upper chest felt like gas not pain or pressure, she felt short of breath 2 days did not last long, no signs of depression, examination in general appearance normal, they checked an x-ray which was normal, EKG was normal, Dr. Mare Ferrari patient more worried that symptoms could be from the aneurysm and she was sent over to be evaluated.  Interval update 11/28/2015:  She returns for follow up. She has not really had anymore episode. Her blood pressure was elevated and  her heart rate was low. It made her feel bad. She had some right sided weakness and she went to bed and it resolved. She not drink water consistently. She was minimally orthostatic last appointment. She often doesn;t drink any water during the day. At night she drinks more. With the weakness she doesn't feel dizzy or lightheaded but helps to lay down. Sheis more tired at the end of the day. No double vision, blurry vision, eyelid drooping. Will check labs for myasthenia gravis. She feels tingling in the feet and  arm and radiates down the entire right side. She describes funny paresthesias.   Christine F Jonesis a 70 y.o.femalehere as a referral from Dr. Joneen Caraway right-sided feelings of numbness and weakness however she denies actually being weak, just feels like it. PMHx of HTN, untreated OSA, stable small mca aneurysm. She has been having symptoms since 2011, husband endorses she has had these symptoms for years.For a long time her blood pressure would be very low when these episodes would happen but that has not happened since they changed her medications She has had several MRIs and MRAs and they have been stable. She feels weak and numb on the right side. Mostly at night when she gets home and lays on the couch. She says that she works hard without a break. Started again on Sunday night after she took the metoprolol and laid down and that is when she started feeling bad. It may have happened after taking the metoprolol. She has had problems with BP and HR fluctuating. She never wakes up with the weakness. She starts feeling very weak on the right side from the face down to the leg. She has had episodes of low blood pressure in the past, but she checked her BP and it was fine 133 But last night symptoms were on the left side. The right side of the face feels funny. Her face feels funny but denies actual weakness or facial droop. Her husband doesn't notice a difference or sees any weakness. Patient goes and lays down. She can use all her limbs, no weakness but she just feels weak. No vision changes, no speech problems, when she is driving she feels fatigued. Symptoms are not that often. It happens when she is tired or stressed. Majority of time it is on the right side but it is sometimes on the left as well. She feels funny in the stomach. Husband is here, says she doesn't eat any meat and she eats mostly vegetables. Happened Sunday nioght, Thursday night, Friday night. Each time it was after she had lays down  and had already taken her medicine. She can't give me details. She has a funny feeling on her head like water running inside her head. She doesn't take her pulse. Husband says a lot of things in the family have happened and she worries.   Reviewed notes, labs and imaging from outside physicians, which showed:  Personally reviewed MRI of the brain/MRA of the head 05/2015 and agree with the following. They are normal for her age:  IMPRESSION: 1. No acute intracranial abnormality. 2. Unchanged, minimal cerebral white matter disease which is nonspecific but may reflect the sequelae of chronic small vessel ischemia. 3. Unchanged 2 mm right MCA aneurysm. 4. No major intracranial arterial occlusion or significant stenosis.  CK nml    REVIEW OF SYSTEMS: Out of a complete 14 system review of symptoms, the patient complains only of the following symptoms, fatigue, occasional headaches and all other reviewed  systems are negative.    ALLERGIES: Allergies  Allergen Reactions  . Amlodipine Swelling and Other (See Comments)    Leg/feet swelling.  . Chocolate Other (See Comments)    Headache  . Coconut Fatty Acids Other (See Comments)  . Hydralazine Other (See Comments)    ? Fast heart rate  . Other Other (See Comments)    MSG causes headaches  . Peanut-Containing Drug Products Other (See Comments)    All peanuts; headache     HOME MEDICATIONS: Outpatient Medications Prior to Visit  Medication Sig Dispense Refill  . Ascorbic Acid (VITAMIN C PO) Take 1 tablet by mouth daily.    Marland Kitchen aspirin EC 81 MG tablet Take 81 mg by mouth daily.     Marland Kitchen atenolol (TENORMIN) 25 MG tablet Take 1.5 tablets (37.5 mg total) by mouth daily. (Patient taking differently: Take 37.5 mg by mouth every evening.) 135 tablet 3  . cholecalciferol (VITAMIN D3) 25 MCG (1000 UNIT) tablet Take 1,000 Units by mouth daily.    . irbesartan (AVAPRO) 300 MG tablet Take 300 mg by mouth daily.    . Multiple Vitamin (MULTIVITAMIN  WITH MINERALS) TABS tablet Take 1 tablet by mouth daily.    . pantoprazole (PROTONIX) 20 MG tablet Take 1 tablet (20 mg total) by mouth daily. 30 tablet 0  . rosuvastatin (CRESTOR) 20 MG tablet TAKE 1 TABLET BY MOUTH EVERY DAY (Patient taking differently: Take 10 mg by mouth every evening.) 90 tablet 2  . calcium-vitamin D 250-100 MG-UNIT tablet Take 1 tablet by mouth 2 (two) times daily.     No facility-administered medications prior to visit.     PAST MEDICAL HISTORY: Past Medical History:  Diagnosis Date  . Aneurysm (HCC)    63mm Right MCA  . Arthritis   . Cataract    Bilateral  . Chronic kidney disease    Dr. Shelia Media (PCP)   . Hypertension   . OSA on CPAP    havent used CPAP in long time per patient due to unable to get filters to change out  . Osteopenia 01/2017   T score -1.4 FRAX 3.4% / 0.4%  . Tachycardia   . Vitamin D deficiency      PAST SURGICAL HISTORY: Past Surgical History:  Procedure Laterality Date  . BIOPSY BREAST  1970   Right Breast  . COLONOSCOPY    . DILATATION & CURETTAGE/HYSTEROSCOPY WITH MYOSURE N/A 03/06/2017   Procedure: DILATATION & CURETTAGE/HYSTEROSCOPY WITH MYOSURE;  Surgeon: [name], MD;  Location: [facility];  Service: Gynecology;  Laterality: N/A;  Request 7:30am in [facility] block time  requests one hour  . DILATATION & CURETTAGE/HYSTEROSCOPY WITH MYOSURE N/A 10/03/2019   Procedure: DILATATION & CURETTAGE/HYSTEROSCOPY WITH MYOSURE;  Surgeon: [name], MD;  Location: [facility];  Service: Gynecology;  Laterality: N/A;  request 8:30am OR time in [place] [facility] block requests 45 minutes  . right lumpectomy  1970   benign     FAMILY HISTORY: Family History  Problem Relation Age of Onset  . Hypertension Mother   . Heart attack Father   . Stroke Father   . Aneurysm Neg Hx      SOCIAL HISTORY: Social History   Socioeconomic History  . Marital status: Married    Spouse name:  Trilby Drummer  . Number of children: 0  . Years of education: 12+  . Highest education level: Not on file  Occupational History  . Not on file  Tobacco Use  .  Smoking status: Former Smoker    Packs/day: 1.00    Years: 5.00    Pack years: 5.00    Types: Cigarettes    Quit date: 04/22/1979    Years since quitting: 41.1  . Smokeless tobacco: Never Used  Vaping Use  . Vaping Use: Never used  Substance and Sexual Activity  . Alcohol use: No    Alcohol/week: 0.0 standard drinks  . Drug use: No  . Sexual activity: Yes    Partners: Male    Birth control/protection: Post-menopausal    Comment: 1ST Intercourse- 64, partners-  73, married- 71 yrs   Other Topics Concern  . Not on file  Social History Narrative   Lives with husband   Caffeine use: none   Right handed   Social Determinants of Health   Financial Resource Strain: Not on file  Food Insecurity: Not on file  Transportation Needs: Not on file  Physical Activity: Not on file  Stress: Not on file  Social Connections: Not on file  Intimate Partner Violence: Not on file      PHYSICAL EXAM  Vitals:   05/23/20 0920  BP: (!) 161/78  Pulse: (!) 53  Weight: 164 lb (74.4 kg)  Height: 5\' 3"  (1.6 m)   Body mass index is 29.05 kg/m.   Generalized: Well developed, in no acute distress  Cardiology: normal rate and rhythm, no murmur auscultated  Respiratory: clear to auscultation bilaterally    Neurological examination  Mentation: Alert oriented to time, place, history taking. Follows all commands speech and language fluent Cranial nerve II-XII: Pupils were equal round reactive to light. Extraocular movements were full, visual field were full on confrontational test. Facial sensation and strength were normal. Head turning and shoulder shrug  were normal and symmetric. Motor: The motor testing reveals 5 over 5 strength of all 4 extremities. Good symmetric motor tone is noted throughout.  Sensory: Sensory testing is intact to soft  touch on all 4 extremities. No evidence of extinction is noted.  Coordination: Cerebellar testing reveals good finger-nose-finger and heel-to-shin bilaterally.  Gait and station: Gait is normal. Tandem gait is normal. Romberg is negative. No drift is seen.  Reflexes: Deep tendon reflexes are symmetric and normal in bilateral upper extremities, brisk but symmetric in bilateral pattellar.     DIAGNOSTIC DATA (LABS, IMAGING, TESTING) - I reviewed patient records, labs, notes, testing and imaging myself where available.  Lab Results  Component Value Date   WBC 4.5 05/09/2020   HGB 13.0 05/09/2020   HCT 40.9 05/09/2020   MCV 94.7 05/09/2020   PLT 205 05/09/2020      Component Value Date/Time   NA 140 05/09/2020 2119   NA 139 12/05/2019 1257   K 3.9 05/09/2020 2119   CL 105 05/09/2020 2119   CO2 26 05/09/2020 2119   GLUCOSE 88 05/09/2020 2119   BUN 17 05/09/2020 2119   BUN 15 12/05/2019 1257   CREATININE 1.14 (H) 05/09/2020 2119   CALCIUM 9.0 05/09/2020 2119   PROT 6.6 05/09/2020 2119   PROT 6.4 12/05/2019 1257   ALBUMIN 3.9 05/09/2020 2119   ALBUMIN 4.1 12/05/2019 1257   AST 26 05/09/2020 2119   ALT 28 05/09/2020 2119   ALKPHOS 97 05/09/2020 2119   BILITOT 0.5 05/09/2020 2119   BILITOT 0.6 12/05/2019 1257   GFRNONAA 52 (L) 05/09/2020 2119   GFRAA 62 12/05/2019 1257   Lab Results  Component Value Date   CHOL 127 12/05/2019   HDL 67 12/05/2019  LDLCALC 50 12/05/2019   LDLDIRECT 121.6 10/13/2006   TRIG 42 12/05/2019   CHOLHDL 1.9 12/05/2019   Lab Results  Component Value Date   HGBA1C 5.8 (H) 11/28/2015   No results found for: [id number] Lab Results  Component Value Date   TSH 1.480 11/28/2015    No flowsheet data found.   No flowsheet data found.   ASSESSMENT AND PLAN  70 y.o. year old female  has a past medical history of Aneurysm ([facility]), Arthritis, Cataract, Chronic kidney disease, Hypertension, OSA on CPAP, Osteopenia (01/2017), Tachycardia, and  Vitamin D deficiency. here with   Nonruptured cerebral aneurysm - Plan: CT ANGIO HEAD W OR WO CONTRAST  Aneurysm of middle cerebral artery - Plan: CT ANGIO HEAD W OR WO CONTRAST  Benign essential hypertension  Christine Steele is doing well, today. She denies any concerns of numbness, weakness or dizziness since last being seen. Last CT angio 06/2019 showed stable 68mm aneurysm of right MCA. She has noted elevation of blood pressures recently. Could be related to diet changes and she has had an episode of acute abdominal pain. I have educated her on the important of blood pressure and cholesterol management. LDL 50 in 11/2019 on rosuvastatin. She has follow up with Dr Shelia Media next week and will monitor BP closely until then. I have given her information on low sodium diet. She is aware of stroke precautions and when to seek emergency medical attention. She will continue stroke precautions with PCP. We will repeat CT angio for surveillance. Creatinine was 1.14 last week. She will follow up closely with PCP. Healthy lifestyle habits encouraged. She will return to see me in 1 year, sooner if needed.   Orders Placed This Encounter  Procedures  . CT ANGIO HEAD W OR WO CONTRAST    Standing Status:   Future    Standing Expiration Date:   05/23/2021    Order Specific Question:   If indicated for the ordered procedure, I authorize the administration of contrast media per Radiology protocol    Answer:   Yes    Order Specific Question:   Preferred imaging location?    Answer:   External     No orders of the defined types were placed in this encounter.     I spent 20 minutes of face-to-face and non-face-to-face time with patient.  This included previsit chart review, lab review, study review, order entry, electronic health record documentation, patient education.    Debbora Presto, MSN, FNP-C 05/23/2020, 10:19 AM  [facility] [address] [place], [place] [address] [phone]   Made  any corrections needed, and agree with history, physical, neuro exam,assessment and plan as stated.     Sarina Ill, MD [facility]

## 2020-05-23 NOTE — Telephone Encounter (Signed)
Medicare/bcbs fed order sent to GI. No auth they will reach out to the patient to schedule.  °

## 2020-05-23 NOTE — Patient Instructions (Signed)
Below is our plan:  We will reorder CT scan for mid March. Continue close follow up with PCP for BP management. Watch sodium intake. Continue to monitor cholesterol and kidney function.    Please make sure you are staying well hydrated. I recommend 50-60 ounces daily. Well balanced diet and regular exercise encouraged.    Please continue follow up with care team as directed.   Follow up in 1 year   You may receive a survey regarding today's visit. I encourage you to leave honest feed back as I do use this information to improve patient care. Thank you for seeing me today!       Cooking With Less Salt Cooking with less salt is one way to reduce the amount of sodium you get from food. Sodium is one of the elements that make up salt. It is found naturally in foods and is also added to certain foods. Depending on your condition and overall health, your health care provider or dietitian may recommend that you reduce your sodium intake. Most people should have less than 2,300 milligrams (mg) of sodium each day. If you have high blood pressure (hypertension), you may need to limit your sodium to 1,500 mg each day. Follow the tips below to help reduce your sodium intake. What are tips for eating less sodium? Reading food labels  Check the food label before buying or using packaged ingredients. Always check the label for the serving size and sodium content.  Look for products with no more than 140 mg of sodium in one serving.  Check the % Daily Value column to see what percent of the daily recommended amount of sodium is provided in one serving of the product. Foods with 5% or less in this column are considered low in sodium. Foods with 20% or higher are considered high in sodium.  Do not choose foods with salt as one of the first three ingredients on the ingredients list. If salt is one of the first three ingredients, it usually means the item is high in sodium.   Shopping  Buy sodium-free  or low-sodium products. Look for the following words on food labels: ? Low-sodium. ? Sodium-free. ? Reduced-sodium. ? No salt added. ? Unsalted.  Always check the sodium content even if foods are labeled as low-sodium or no salt added.  Buy fresh foods. Cooking  Use herbs, seasonings without salt, and spices as substitutes for salt.  Use sodium-free baking soda when baking.  Grill, braise, or roast foods to add flavor with less salt.  Avoid adding salt to pasta, rice, or hot cereals.  Drain and rinse canned vegetables, beans, and meat before use.  Avoid adding salt when cooking sweets and desserts.  Cook with low-sodium ingredients. What foods are high in sodium? Vegetables Regular canned vegetables (not low-sodium or reduced-sodium). Sauerkraut, pickled vegetables, and relishes. Olives. [place] fries. Onion rings. Regular canned tomato sauce and paste. Regular tomato and vegetable juice. Frozen vegetables in sauces. Grains Instant hot cereals. Bread stuffing, pancake, and biscuit mixes. Croutons. Seasoned rice or pasta mixes. Noodle soup cups. Boxed or frozen macaroni and cheese. Regular salted crackers. Self-rising flour. Rolls. Bagels. Flour tortillas and wraps. Meats and other proteins Meat or fish that is salted, canned, smoked, cured, spiced, or pickled. This includes bacon, ham, sausages, hot dogs, corned beef, chipped beef, meat loaves, salt pork, jerky, pickled herring, anchovies, regular canned tuna, and sardines. Salted nuts. Dairy Processed cheese and cheese spreads. Cheese curds. Blue cheese. Feta cheese.  String cheese. Regular cottage cheese. Buttermilk. Canned milk. The items listed above may not be a complete list of foods high in sodium. Actual amounts of sodium may be different depending on processing. Contact a dietitian for more information. What foods are low in sodium? Fruits Fresh, frozen, or canned fruit with no sauce added. Fruit juice. Vegetables Fresh  or frozen vegetables with no sauce added. "No salt added" canned vegetables. "No salt added" tomato sauce and paste. Low-sodium or reduced-sodium tomato and vegetable juice. Grains Noodles, pasta, quinoa, rice. Shredded or puffed wheat or puffed rice. Regular or quick oats (not instant). Low-sodium crackers. Low-sodium bread. Whole-grain bread and whole-grain pasta. Unsalted popcorn. Meats and other proteins Fresh or frozen whole meats, poultry (not injected with sodium), and fish with no sauce added. Unsalted nuts. Dried peas, beans, and lentils without added salt. Unsalted canned beans. Eggs. Unsalted nut butters. Low-sodium canned tuna or chicken. Dairy Milk. Soy milk. Yogurt. Low-sodium cheeses, such as Swiss, Monterey Jack, [place], and [employer]. Sherbet or ice cream (keep to  cup per serving). Cream cheese. Fats and oils Unsalted butter or margarine. Other foods Homemade pudding. Sodium-free baking soda and baking powder. Herbs and spices. Low-sodium seasoning mixes. Beverages Coffee and tea. Carbonated beverages. The items listed above may not be a complete list of foods low in sodium. Actual amounts of sodium may be different depending on processing. Contact a dietitian for more information. What are some salt alternatives when cooking? The following are herbs, seasonings, and spices that can be used instead of salt to flavor your food. Herbs should be fresh or dried. Do not choose packaged mixes. Next to the name of the herb, spice, or seasoning are some examples of foods you can pair it with. Herbs  Bay leaves - Soups, meat and vegetable dishes, and spaghetti sauce.  Basil - [employer], soups, pasta, and fish dishes.  Cilantro - Meat, poultry, and vegetable dishes.  Chili powder - Marinades and Mexican dishes.  Chives - Salad dressings and potato dishes.  Cumin - Mexican dishes, couscous, and meat dishes.  Dill - Fish dishes, sauces, and salads.  Fennel - Meat and  vegetable dishes, breads, and cookies.  Garlic (do not use garlic salt) - [place] dishes, meat dishes, salad dressings, and sauces.  Marjoram - Soups, potato dishes, and meat dishes.  Oregano - Pizza and spaghetti sauce.  Parsley - Salads, soups, pasta, and meat dishes.  Rosemary - [place] dishes, salad dressings, soups, and red meats.  Saffron - Fish dishes, pasta, and some poultry dishes.  Sage - Stuffings and sauces.  Tarragon - Fish and [employer].  Thyme - Stuffing, meat, and fish dishes. Seasonings  Lemon juice - Fish dishes, poultry dishes, vegetables, and salads.  Vinegar - Salad dressings, vegetables, and fish dishes. Spices  Cinnamon - Sweet dishes, such as cakes, cookies, and puddings.  Cloves - Gingerbread, puddings, and marinades for meats.  Curry - Vegetable dishes, fish and poultry dishes, and stir-fry dishes.  Ginger - Vegetable dishes, fish dishes, and stir-fry dishes.  Nutmeg - Pasta, vegetables, poultry, fish dishes, and custard. Summary  Cooking with less salt is one way to reduce the amount of sodium that you get from food.  Buy sodium-free or low-sodium products.  Check the food label before using or buying packaged ingredients.  Use herbs, seasonings without salt, and spices as substitutes for salt in foods. This information is not intended to replace advice given to you by your health care provider. Make  sure you discuss any questions you have with your health care provider. Document Revised: 03/30/2019 Document Reviewed: 03/30/2019 Elsevier Patient Education  2021 [place].  Hypertension, Adult Hypertension is another name for high blood pressure. High blood pressure forces your heart to work harder to pump blood. This can cause problems over time. There are two numbers in a blood pressure reading. There is a top number (systolic) over a bottom number (diastolic). It is best to have a blood pressure that is below 120/80. Healthy  choices can help lower your blood pressure, or you may need medicine to help lower it. What are the causes? The cause of this condition is not known. Some conditions may be related to high blood pressure. What increases the risk?  Smoking.  Having type 2 diabetes mellitus, high cholesterol, or both.  Not getting enough exercise or physical activity.  Being overweight.  Having too much fat, sugar, calories, or salt (sodium) in your diet.  Drinking too much alcohol.  Having long-term (chronic) kidney disease.  Having a family history of high blood pressure.  Age. Risk increases with age.  Race. You may be at higher risk if you are African American.  Gender. Men are at higher risk than women before age 77. After age 44, women are at higher risk than men.  Having obstructive sleep apnea.  Stress. What are the signs or symptoms?  High blood pressure may not cause symptoms. Very high blood pressure (hypertensive crisis) may cause: ? Headache. ? Feelings of worry or nervousness (anxiety). ? Shortness of breath. ? Nosebleed. ? A feeling of being sick to your stomach (nausea). ? Throwing up (vomiting). ? Changes in how you see. ? Very bad chest pain. ? Seizures. How is this treated?  This condition is treated by making healthy lifestyle changes, such as: ? Eating healthy foods. ? Exercising more. ? Drinking less alcohol.  Your health care provider may prescribe medicine if lifestyle changes are not enough to get your blood pressure under control, and if: ? Your top number is above 130. ? Your bottom number is above 80.  Your personal target blood pressure may vary. Follow these instructions at home: Eating and drinking  If told, follow the DASH eating plan. To follow this plan: ? Fill one half of your plate at each meal with fruits and vegetables. ? Fill one fourth of your plate at each meal with whole grains. Whole grains include whole-wheat pasta, brown rice, and  whole-grain bread. ? Eat or drink low-fat dairy products, such as skim milk or low-fat yogurt. ? Fill one fourth of your plate at each meal with low-fat (lean) proteins. Low-fat proteins include fish, chicken without skin, eggs, beans, and tofu. ? Avoid fatty meat, cured and processed meat, or chicken with skin. ? Avoid pre-made or processed food.  Eat less than 1,500 mg of salt each day.  Do not drink alcohol if: ? Your doctor tells you not to drink. ? You are pregnant, may be pregnant, or are planning to become pregnant.  If you drink alcohol: ? Limit how much you use to:  0-1 drink a day for women.  0-2 drinks a day for men. ? Be aware of how much alcohol is in your drink. In the [place] one drink equals one 12 oz bottle of beer (355 mL), one 5 oz glass of wine (148 mL), or one 1 oz glass of hard liquor (44 mL).   Lifestyle  Work with your doctor to stay  at a healthy weight or to lose weight. Ask your doctor what the best weight is for you.  Get at least 30 minutes of exercise most days of the week. This may include walking, swimming, or biking.  Get at least 30 minutes of exercise that strengthens your muscles (resistance exercise) at least 3 days a week. This may include lifting weights or doing Pilates.  Do not use any products that contain nicotine or tobacco, such as cigarettes, e-cigarettes, and chewing tobacco. If you need help quitting, ask your doctor.  Check your blood pressure at home as told by your doctor.  Keep all follow-up visits as told by your doctor. This is important.   Medicines  Take over-the-counter and prescription medicines only as told by your doctor. Follow directions carefully.  Do not skip doses of blood pressure medicine. The medicine does not work as well if you skip doses. Skipping doses also puts you at risk for problems.  Ask your doctor about side effects or reactions to medicines that you should watch for. Contact a doctor if you:  Think  you are having a reaction to the medicine you are taking.  Have headaches that keep coming back (recurring).  Feel dizzy.  Have swelling in your ankles.  Have trouble with your vision. Get help right away if you:  Get a very bad headache.  Start to feel mixed up (confused).  Feel weak or numb.  Feel faint.  Have very bad pain in your: ? Chest. ? Belly (abdomen).  Throw up more than once.  Have trouble breathing. Summary  Hypertension is another name for high blood pressure.  High blood pressure forces your heart to work harder to pump blood.  For most people, a normal blood pressure is less than 120/80.  Making healthy choices can help lower blood pressure. If your blood pressure does not get lower with healthy choices, you may need to take medicine. This information is not intended to replace advice given to you by your health care provider. Make sure you discuss any questions you have with your health care provider. Document Revised: 12/16/2017 Document Reviewed: 12/16/2017 Elsevier Patient Education  [place].   Cerebral Aneurysm  A cerebral aneurysm is a bulge that occurs in a blood vessel (artery) inside the brain. An aneurysm is caused when a weakened part of the blood vessel expands. The blood vessel expands due to the constant pressure from the flow of blood through the weakened blood vessel. As the aneurysm expands, the walls of the aneurysm become weaker. Aneurysms are dangerous because they can leak or burst (rupture). When a cerebral aneurysm ruptures, it causes bleeding in the brain (subarachnoid hemorrhage). The blood flow to the area of the brain supplied by the artery is also reduced. This can cause a stroke, seizures, or a coma. A ruptured cerebral aneurysm is a medical emergency. This can cause permanent brain damage or death. What are the causes? The exact cause of this condition is not known. What increases the risk? The following factors  may make you more likely to develop this condition:  Being older. The condition is most common in people between the ages of 47 and 35.  Being female.  Having a family history of aneurysm in two or more direct relatives.  Having certain conditions that are passed along from parent to child (inherited). They include: ? Autosomal dominant polycystic kidney disease. This is a condition in which small, fluid-filled sacs (cysts) develop in the kidney. ?  Neurofibromatosis type 1. In this condition, flat spots develop under the skin (pigmentation) and tumors grow along nerves in the skin, brain, and other parts of the body. ? Ehlers-Danlos syndrome. This is a condition in which bad connective tissue causes loose or unstable joints and creates a very soft skin that bruises or tears easily.  Smoking.  Having high blood pressure (hypertension).  Abusing alcohol. What are the signs or symptoms? The symptoms of a cerebral aneurysm that has not leaked or ruptured can depend on its size and rate of growth. A small, unchanging aneurysm generally does not cause symptoms. A larger aneurysm that is steadily growing can increase pressure on the brain or nerves. This increased pressure can cause:  A headache.  Vision problems.  Numbness or weakness in an arm or leg.  Memory problems.  Problems speaking.  Seizures. If an aneurysm leaks or ruptures, it can cause a life-threatening condition, such as a stroke. Symptoms may include:  A sudden, severe headache with no known cause. The headache is often described as the worst headache ever experienced.  Stiff neck.  Nausea or vomiting, especially when combined with other symptoms, such as a headache.  Sudden weakness or numbness of the face, arm, or leg, especially on one side of the body.  Sudden trouble walking or difficulty moving the arms or legs.  Double vision or sudden trouble seeing in one or both eyes.  Trouble speaking or understanding  speech.  Trouble swallowing.  Dizziness.  Loss of balance or coordination.  Intolerance to light.  Sudden confusion or loss of consciousness. How is this diagnosed? This condition is diagnosed using certain tests, including:  CT scan.  Computed tomographic angiogram (CTA). This test uses a dye and a scanner to produce images of your blood vessels.  Magnetic resonance angiogram (MRA). This test uses an MRI machine to produce images of your blood vessels.  Digital subtraction angiogram (DSA). This test involves placing a long, thin tube (catheter) into the artery in your thigh and guiding it up to the arteries in the brain. A dye is then injected into the area, and X-rays are taken to create images of your blood vessels. How is this treated? Unruptured aneurysm Treatment for an aneurysm that is not causing problems will depend on many factors, such as the size and location of the aneurysm, your age, your overall health, and your preferences. Small aneurysms in certain locations of the brain have a very low chance of bleeding or rupturing. These small aneurysms may not need to be treated. Your health care provider may monitor the aneurysm regularly to check for any changes. In some cases, however, treatment may be required because of the size or location of an aneurysm. Treatment options may include:  Coiling. During this procedure, a catheter is inserted and advanced through a blood vessel. Once the catheter reaches the aneurysm, tiny coils are used to block blood flow into the aneurysm. This procedure is sometimes done at the same time as a DSA.  Surgical clipping. During surgery, a clip is placed at the base of the aneurysm. The clip prevents blood from continuing to enter the aneurysm.  Flow diversion. This procedure is used to divert blood flow around the aneurysm with a stent that is placed across the opening of an aneurysm. Ruptured aneurysm For a ruptured aneurysm, emergency  surgery or coiling is often needed right away to help prevent damage to the brain and to reduce the risk of rebleeding. Follow these instructions  at home: If your aneurysm is not treated:  Take over-the-counter and prescription medicines only as told by your health care provider.  Follow a diet suggested by your health care provider. Certain dietary changes may be advised to address hypertension, such as choosing foods that are low in salt (sodium), saturated fat, trans fat, and cholesterol.  Stay physically active. Try to get at least 30 minutes of activity on most or all days of the week.  Do not use any products that contain nicotine or tobacco. These products include cigarettes, chewing tobacco, and vaping devices, such as e-cigarettes. If you need help quitting, ask your health care provider.  If you drink alcohol: ? Limit how much you have to:  0-1 drink a day for women who are not pregnant.  0-2 drinks a day for men. ? Know how much alcohol is in your drink. In the [place] one drink equals one 12 oz bottle of beer (355 mL), one 5 oz glass of wine (148 mL), or one 1 oz glass of hard liquor (44 mL).  Do not use drugs. If you need help quitting, ask your health care provider.  Keep all follow-up visits. This is important. This includes any referrals, imaging studies, and lab tests. Proper follow-up may prevent an aneurysm rupture or a stroke. Get help right away if:  You have a sudden, severe headache with no known cause. This may include a stiff neck.  You have sudden nausea or vomiting with a severe headache.  You have a seizure.  You have other symptoms of a stroke. "BE FAST" is an easy way to remember the main warning signs of a stroke: ? B - Balance. Signs are dizziness, sudden trouble walking, or loss of balance. ? E - Eyes. Signs are trouble seeing or a sudden change in vision. ? F - Face. Signs are sudden weakness or numbness of the face, or the face or eyelid drooping on  one side. ? A - Arms. Signs are weakness or numbness in an arm. This happens suddenly and usually on one side of the body. ? S - Speech. Signs are sudden trouble speaking, slurred speech, or trouble understanding what people say. ? T - Time. Time to call emergency services. Write down what time symptoms started. These symptoms may represent a serious problem that is an emergency. Do not wait to see if the symptoms will go away. Get medical help right away. Call your local emergency services (911 in the [place] Do not drive yourself to the hospital. Summary  A cerebral aneurysm is a bulge that occurs in a blood vessel (artery) inside the brain.  Aneurysms are dangerous because they can leak or burst (rupture). When a cerebral aneurysm ruptures, it causes bleeding in the brain.  Treatment depends on many factors, including the size and location of the aneurysm and whether it is ruptured. A ruptured aneurysm is a medical emergency.  Get help right away if you have symptoms of a stroke. "BE FAST" is an easy way to remember the main warning signs of a stroke. This information is not intended to replace advice given to you by your health care provider. Make sure you discuss any questions you have with your health care provider. Document Revised: 12/27/2019 Document Reviewed: 12/27/2019 Elsevier Patient Education  2021 [employer].

## 2020-05-29 DIAGNOSIS — K219 Gastro-esophageal reflux disease without esophagitis: Secondary | ICD-10-CM | POA: Diagnosis not present

## 2020-05-29 DIAGNOSIS — K429 Umbilical hernia without obstruction or gangrene: Secondary | ICD-10-CM | POA: Diagnosis not present

## 2020-05-29 DIAGNOSIS — I10 Essential (primary) hypertension: Secondary | ICD-10-CM | POA: Diagnosis not present

## 2020-06-13 ENCOUNTER — Other Ambulatory Visit: Payer: Self-pay

## 2020-06-13 ENCOUNTER — Encounter: Payer: Self-pay | Admitting: Obstetrics & Gynecology

## 2020-06-13 ENCOUNTER — Ambulatory Visit (INDEPENDENT_AMBULATORY_CARE_PROVIDER_SITE_OTHER): Payer: Medicare Other | Admitting: Obstetrics & Gynecology

## 2020-06-13 VITALS — BP 120/82 | Ht 62.5 in | Wt 165.0 lb

## 2020-06-13 DIAGNOSIS — Z780 Asymptomatic menopausal state: Secondary | ICD-10-CM

## 2020-06-13 DIAGNOSIS — M85852 Other specified disorders of bone density and structure, left thigh: Secondary | ICD-10-CM

## 2020-06-13 DIAGNOSIS — Z01419 Encounter for gynecological examination (general) (routine) without abnormal findings: Secondary | ICD-10-CM | POA: Diagnosis not present

## 2020-06-13 DIAGNOSIS — E663 Overweight: Secondary | ICD-10-CM

## 2020-06-13 NOTE — Progress Notes (Signed)
Christine Steele March 29, 1951 [id number]   History:    70 y.o.  G2P1A1L1  RP: Established patient presentingfor annual gyn exam   HPI:S/P hysteroscopy with MyoSure excision and D&C on October 03, 2019.  Patho benign endometrial polyp.  Postmenopause. No HRT.  No PMB x HSC 09/2019. No pelvic pain. No pain with IC. Breasts wnl. Mictions/BMs wnl.  BMI improved to 29.7.  Not exercising regularly.  Health labs with Fam MD.   Past medical history,surgical history, family history and social history were all reviewed and documented in the EPIC chart.  Gynecologic History No LMP recorded. Patient is postmenopausal.  Obstetric History OB History  Gravida Para Term Preterm AB Living  2 1     1 1   SAB IAB Ectopic Multiple Live Births  1            # Outcome Date GA Lbr Len/2nd Weight Sex Delivery Anes PTL Lv  2 SAB           1 Para              ROS: A ROS was performed and pertinent positives and negatives are included in the history.  GENERAL: No fevers or chills. HEENT: No change in vision, no earache, sore throat or sinus congestion. NECK: No pain or stiffness. CARDIOVASCULAR: No chest pain or pressure. No palpitations. PULMONARY: No shortness of breath, cough or wheeze. GASTROINTESTINAL: No abdominal pain, nausea, vomiting or diarrhea, melena or bright red blood per rectum. GENITOURINARY: No urinary frequency, urgency, hesitancy or dysuria. MUSCULOSKELETAL: No joint or muscle pain, no back pain, no recent trauma. DERMATOLOGIC: No rash, no itching, no lesions. ENDOCRINE: No polyuria, polydipsia, no heat or cold intolerance. No recent change in weight. HEMATOLOGICAL: No anemia or easy bruising or bleeding. NEUROLOGIC: No headache, seizures, numbness, tingling or weakness. PSYCHIATRIC: No depression, no loss of interest in normal activity or change in sleep pattern.     Exam:   BP 120/82   Ht 5' 2.5" (1.588 m)   Wt 165 lb (74.8 kg)   BMI 29.70 kg/m   Body mass index is 29.7  kg/m.  General appearance : Well developed well nourished female. No acute distress HEENT: Eyes: no retinal hemorrhage or exudates,  Neck supple, trachea midline, no carotid bruits, no thyroidmegaly Lungs: Clear to auscultation, no rhonchi or wheezes, or rib retractions  Heart: Regular rate and rhythm, no murmurs or gallops Breast:Examined in sitting and supine position were symmetrical in appearance, no palpable masses or tenderness,  no skin retraction, no nipple inversion, no nipple discharge, no skin discoloration, no axillary or supraclavicular lymphadenopathy Abdomen: no palpable masses or tenderness, no rebound or guarding Extremities: no edema or skin discoloration or tenderness  Pelvic: Vulva: Normal             Vagina: No gross lesions or discharge  Cervix: No gross lesions or discharge  Uterus  AV, normal size, shape and consistency, non-tender and mobile  Adnexa  Without masses or tenderness  Anus: Normal   Assessment/Plan:  70 y.o. female for annual exam   1. Well female exam with routine gynecological exam Normal gynecologic exam in menopause.  No indication for Pap test this year.  Last Pap test February 2021 was negative.  Breast exam normal.  Patient is overdue for screening mammogram, will schedule now at the breast center.  Will organize a colonoscopy through her family physician.  Health labs with family physician.  2. Postmenopause Well on no hormone  replacement therapy.  No postmenopausal bleeding.  3. Osteopenia of neck of left femur Osteopenia on bone density October 2018.  We will schedule a bone density here now.  Vitamin D supplements, calcium intake of 1.5 g/day and regular weightbearing physical activity is recommended.  4. Overweight (BMI 25.0-29.9) Successful weight loss since last year.  Will continue on a low calorie/carb diet.  Aerobic activities 5 times a week and light weightlifting every 2 days.  [name] MD, 11:56 AM 06/13/2020

## 2020-06-19 DIAGNOSIS — I10 Essential (primary) hypertension: Secondary | ICD-10-CM | POA: Diagnosis not present

## 2020-06-19 DIAGNOSIS — K219 Gastro-esophageal reflux disease without esophagitis: Secondary | ICD-10-CM | POA: Diagnosis not present

## 2020-07-05 ENCOUNTER — Other Ambulatory Visit: Payer: Self-pay | Admitting: Obstetrics & Gynecology

## 2020-07-05 DIAGNOSIS — Z1231 Encounter for screening mammogram for malignant neoplasm of breast: Secondary | ICD-10-CM

## 2020-07-23 ENCOUNTER — Ambulatory Visit
Admission: RE | Admit: 2020-07-23 | Discharge: 2020-07-23 | Disposition: A | Discharge status: Home / Self Care | Payer: Medicare Other | Source: Ambulatory Visit | Attending: Family Medicine | Admitting: Family Medicine

## 2020-07-23 DIAGNOSIS — I671 Cerebral aneurysm, nonruptured: Secondary | ICD-10-CM | POA: Diagnosis not present

## 2020-07-23 MED ORDER — IOPAMIDOL (ISOVUE-370) INJECTION 76%
75.0000 mL | Freq: Once | INTRAVENOUS | Status: AC | PRN
  Administered 2020-07-23: 75 mL via INTRAVENOUS

## 2020-07-27 ENCOUNTER — Other Ambulatory Visit: Payer: Self-pay | Admitting: Cardiovascular Disease

## 2020-08-15 DIAGNOSIS — I10 Essential (primary) hypertension: Secondary | ICD-10-CM | POA: Diagnosis not present

## 2020-08-22 DIAGNOSIS — I671 Cerebral aneurysm, nonruptured: Secondary | ICD-10-CM | POA: Diagnosis not present

## 2020-08-22 DIAGNOSIS — E7800 Pure hypercholesterolemia, unspecified: Secondary | ICD-10-CM | POA: Diagnosis not present

## 2020-08-22 DIAGNOSIS — E559 Vitamin D deficiency, unspecified: Secondary | ICD-10-CM | POA: Diagnosis not present

## 2020-08-22 DIAGNOSIS — N183 Chronic kidney disease, stage 3 unspecified: Secondary | ICD-10-CM | POA: Diagnosis not present

## 2020-08-22 DIAGNOSIS — K219 Gastro-esophageal reflux disease without esophagitis: Secondary | ICD-10-CM | POA: Diagnosis not present

## 2020-08-22 DIAGNOSIS — I10 Essential (primary) hypertension: Secondary | ICD-10-CM | POA: Diagnosis not present

## 2020-08-22 DIAGNOSIS — Z0000 Encounter for general adult medical examination without abnormal findings: Secondary | ICD-10-CM | POA: Diagnosis not present

## 2020-08-22 DIAGNOSIS — Z8739 Personal history of other diseases of the musculoskeletal system and connective tissue: Secondary | ICD-10-CM | POA: Diagnosis not present

## 2020-08-22 DIAGNOSIS — R5383 Other fatigue: Secondary | ICD-10-CM | POA: Diagnosis not present

## 2020-08-23 DIAGNOSIS — R5383 Other fatigue: Secondary | ICD-10-CM | POA: Diagnosis not present

## 2020-08-23 DIAGNOSIS — E559 Vitamin D deficiency, unspecified: Secondary | ICD-10-CM | POA: Diagnosis not present

## 2020-08-23 DIAGNOSIS — Z0000 Encounter for general adult medical examination without abnormal findings: Secondary | ICD-10-CM | POA: Diagnosis not present

## 2020-08-28 ENCOUNTER — Ambulatory Visit
Admission: RE | Admit: 2020-08-28 | Discharge: 2020-08-28 | Disposition: A | Discharge status: Home / Self Care | Payer: Medicare Other | Source: Ambulatory Visit | Attending: Obstetrics & Gynecology | Admitting: Obstetrics & Gynecology

## 2020-08-28 ENCOUNTER — Other Ambulatory Visit: Payer: Self-pay

## 2020-08-28 DIAGNOSIS — Z1231 Encounter for screening mammogram for malignant neoplasm of breast: Secondary | ICD-10-CM

## 2020-10-13 ENCOUNTER — Other Ambulatory Visit: Payer: Self-pay | Admitting: Cardiovascular Disease

## 2020-10-16 NOTE — Telephone Encounter (Signed)
Rx(s) sent to pharmacy electronically.  

## 2021-01-11 ENCOUNTER — Other Ambulatory Visit: Payer: Self-pay | Admitting: Cardiovascular Disease

## 2021-01-24 ENCOUNTER — Other Ambulatory Visit: Payer: Self-pay | Admitting: Cardiovascular Disease

## 2021-01-28 ENCOUNTER — Other Ambulatory Visit: Payer: Self-pay | Admitting: Cardiovascular Disease

## 2021-01-28 NOTE — Telephone Encounter (Signed)
Rx(s) sent to pharmacy electronically.  

## 2021-01-30 DIAGNOSIS — R238 Other skin changes: Secondary | ICD-10-CM | POA: Diagnosis not present

## 2021-01-30 DIAGNOSIS — B001 Herpesviral vesicular dermatitis: Secondary | ICD-10-CM | POA: Diagnosis not present

## 2021-02-04 DIAGNOSIS — R238 Other skin changes: Secondary | ICD-10-CM | POA: Diagnosis not present

## 2021-02-04 DIAGNOSIS — B001 Herpesviral vesicular dermatitis: Secondary | ICD-10-CM | POA: Diagnosis not present

## 2021-02-21 ENCOUNTER — Other Ambulatory Visit: Payer: Self-pay | Admitting: Cardiovascular Disease

## 2021-02-27 DIAGNOSIS — K219 Gastro-esophageal reflux disease without esophagitis: Secondary | ICD-10-CM | POA: Diagnosis not present

## 2021-02-27 DIAGNOSIS — I10 Essential (primary) hypertension: Secondary | ICD-10-CM | POA: Diagnosis not present

## 2021-02-27 DIAGNOSIS — E559 Vitamin D deficiency, unspecified: Secondary | ICD-10-CM | POA: Diagnosis not present

## 2021-02-27 DIAGNOSIS — E7800 Pure hypercholesterolemia, unspecified: Secondary | ICD-10-CM | POA: Diagnosis not present

## 2021-03-01 DIAGNOSIS — I10 Essential (primary) hypertension: Secondary | ICD-10-CM | POA: Diagnosis not present

## 2021-03-01 DIAGNOSIS — E7800 Pure hypercholesterolemia, unspecified: Secondary | ICD-10-CM | POA: Diagnosis not present

## 2021-03-01 DIAGNOSIS — E559 Vitamin D deficiency, unspecified: Secondary | ICD-10-CM | POA: Diagnosis not present

## 2021-05-22 NOTE — Progress Notes (Signed)
x  Chief Complaint  Patient presents with   Follow-up    Pt alone, rm 11. Here for yearly follow up. Pt states overall things are stable. No issues or concerns     HISTORY OF PRESENT ILLNESS: 05/23/21 ALL: Christine Steele returns for follow up cerebral aneurysm. CTA 07/2020 showed stable 29mm aneurysm. Imaging stable since 2011. She reports doing well. No significant changes in medical history. No headaches. She does have some trouble focusing at times. She wears glasses. She has not had a recent eye exam. BP seems well managed. She is taking atenolol 37.5mg  and HCTZ 25mg . She is also rosuvastatin and asa 81mg  daily.   05/22/2020 ALL:  Christine Steele is a 71 y.o. female here today for follow up for cerebral aneurysm. She reports that she is doing very well. She denies any concerns of numbness or weakness. No dizziness or vision changes. BP has been elevated for about 2 weeks. Normally 120's/80's on atenolol. She was seen in the ER last week for abdominal pain and blood pressure was 171/83. In the office today, it is 161/78. She admits that she had been very careful about sodium intake but has had more thatn usual over the past few weeks. She continues close follow up with PCP for management. Last visit 01/2020. She has follow up next week. She is uncertain if she has taken atenolol this morning because she was rushing to get to our office. She usually takes every morning. Rosuvastatin was decreased to 10mg  daily due to muscle aches. LDL 50 on 12/05/2019.    HISTORY (copied from Dr Cathren Laine previous note)  HPI:  Christine Steele is a 71 y.o. female here as a referral from Dr. Shelia Media, she has been seen in the past for a constellation of symptoms such as bilateral weakness, orthostasis due to dehydration, we saw an aneurysm on MRA of the head a few years ago and she is here because she is worried about this. She is still having similar symptoms as before, numbness on the right side, all the way to her feet. The  symptoms went away for a while but now having them again but no changes in quality or duration, she still reports stress when that happens or when she doesn't drink  Lot she is on her feet a long time, if he lays down she feels better, no dizziness, not lightheaded, never passed out, she does feel weak with the episodes nd the next day she feels a little funny from it. She also feels it on the left side and feels funny, so unlikely seizures, always with stress. She states she had carotid Dopplers at Dr. Pennie Banter. She denies dizziness recently but she once had dizziness with the episode. Reviewed labs 04/26/19 BUN 10 Creatinine 1.2. No other focal neurologic deficits, associated symptoms, inciting events or modifiable factors.   MRA head 2017: personally reviewed images and agree with the following 1. No acute intracranial abnormality. 2. Unchanged, minimal cerebral white matter disease which is nonspecific but may reflect the sequelae of chronic small vessel ischemia. 3. Unchanged 2 mm right MCA aneurysm. 4. No major intracranial arterial occlusion or significant stenosis.   I reviewed Dr. Katrine Coho notes, he referred her for follow-up of cerebral aneurysm, intermittent dizziness, feeling funny in the head, so she went to bed, comes on when she gets overwhelmed or under pressure, but overall she does not feel generally anxious, she also had an episode where she felt weak on the right side and  her face felt funny in the upper chest felt like gas not pain or pressure, she felt short of breath 2 days did not last long, no signs of depression, examination in general appearance normal, they checked an x-ray which was normal, EKG was normal, Dr. Mare Ferrari patient more worried that symptoms could be from the aneurysm and she was sent over to be evaluated.   Interval update 11/28/2015:  She returns for follow up. She has not really had anymore episode. Her blood pressure was elevated and her heart rate was low. It  made her feel bad. She had some right sided weakness and she went to bed and it resolved. She not drink water consistently. She was minimally orthostatic last appointment. She often doesn;t drink any water during the day. At night she drinks more. With the weakness she doesn't feel dizzy or lightheaded but helps to lay down. Sheis more tired at the end of the day. No double vision, blurry vision, eyelid drooping. Will check labs for myasthenia gravis. She feels tingling in the feet and arm and radiates down the entire right side. She describes funny paresthesias.    HPI:  Christine Steele is a 71 y.o. female here as a referral from Dr. Jenny Reichmann for right-sided feelings of numbness and weakness however she denies actually being weak, just feels like it. PMHx of HTN, untreated OSA, stable small mca aneurysm.  She has been having symptoms since 2011, husband endorses she has had these symptoms for years.For a long time her blood pressure would be very low when these episodes would happen but that has not happened since they changed her medications  She has had several MRIs and MRAs and they have been stable. She feels weak and numb on the right side. Mostly at night when she gets home and lays on the couch. She says that she works hard without a break. Started again on Sunday night after she took the metoprolol and laid down and that is when she started feeling bad. It may have happened after taking the metoprolol. She has had problems with BP and HR fluctuating. She never wakes up with the weakness. She starts feeling very weak on the right side from the face down to the leg. She has had episodes of low blood pressure in the past, but she checked her BP and it was fine 133  But last night symptoms were on the left side. The right side of the face feels funny. Her face feels funny but denies actual weakness or facial droop. Her husband doesn't notice a difference or sees any weakness. Patient goes and lays down. She can use  all her limbs, no weakness but she just feels weak. No vision changes, no speech problems, when she is driving she feels fatigued. Symptoms are not that often. It happens when she is tired or stressed. Majority of time it is on the right side but it is sometimes on the left as well. She feels funny in the stomach. Husband is here, says she doesn't eat any meat and she eats mostly vegetables. Happened Sunday nioght, Thursday night, Friday night. Each time it was after she had lays down and had already taken her medicine. She can't give me details. She has a funny feeling on her head like water running inside her head.  She doesn't take her pulse. Husband says a lot of things in the family have happened and she worries.    Reviewed notes, labs and imaging from outside  physicians, which showed:   Personally reviewed MRI of the brain/MRA of the head 05/2015 and agree with the following. They are normal for her age:   IMPRESSION: 1. No acute intracranial abnormality. 2. Unchanged, minimal cerebral white matter disease which is nonspecific but may reflect the sequelae of chronic small vessel ischemia. 3. Unchanged 2 mm right MCA aneurysm. 4. No major intracranial arterial occlusion or significant stenosis.   CK nml  REVIEW OF SYSTEMS: Out of a complete 14 system review of symptoms, the patient complains only of the following symptoms, fatigue, and all other reviewed systems are negative.    ALLERGIES: Allergies  Allergen Reactions   Amlodipine Swelling and Other (See Comments)    Leg/feet swelling.   Chocolate Other (See Comments)    Headache   Coconut Fatty Acids Other (See Comments)   Hydralazine Other (See Comments)    ? Fast heart rate   Other Other (See Comments)    MSG causes headaches   Peanut-Containing Drug Products Other (See Comments)    All peanuts; headache     HOME MEDICATIONS: Outpatient Medications Prior to Visit  Medication Sig Dispense Refill   Ascorbic Acid (VITAMIN  C PO) Take 1 tablet by mouth daily.     aspirin EC 81 MG tablet Take 81 mg by mouth daily.      atenolol (TENORMIN) 25 MG tablet Take 1.5 tablets (37.5 mg total) by mouth daily. NEED APPOINTMENT 30 tablet 0   cholecalciferol (VITAMIN D3) 25 MCG (1000 UNIT) tablet Take 1,000 Units by mouth daily.     hydrochlorothiazide (HYDRODIURIL) 25 MG tablet Take 25 mg by mouth daily.     irbesartan (AVAPRO) 300 MG tablet Take 300 mg by mouth daily.     Multiple Vitamin (MULTIVITAMIN WITH MINERALS) TABS tablet Take 1 tablet by mouth daily.     pantoprazole (PROTONIX) 20 MG tablet Take 1 tablet (20 mg total) by mouth daily. 30 tablet 0   rosuvastatin (CRESTOR) 10 MG tablet Take 1 tablet (10 mg total) by mouth daily at 6 PM. Please make overdue appt with Dr. Oval Linsey before anymore refills. Thank you 2nd attempt 15 tablet 0   No facility-administered medications prior to visit.     PAST MEDICAL HISTORY: Past Medical History:  Diagnosis Date   Aneurysm ([place])    17mm Right MCA   Arthritis    Cataract    Bilateral   Chronic kidney disease    Dr. Shelia Media (PCP)    Hypertension    OSA on CPAP    havent used CPAP in long time per patient due to unable to get filters to change out   Osteopenia 01/2017   T score -1.4 FRAX 3.4% / 0.4%   Tachycardia    Vitamin D deficiency      PAST SURGICAL HISTORY: Past Surgical History:  Procedure Laterality Date   BIOPSY BREAST  1970   Right Breast   COLONOSCOPY     DILATATION & CURETTAGE/HYSTEROSCOPY WITH MYOSURE N/A 03/06/2017   Procedure: [place];  Surgeon: [name], MD;  Location: [facility];  Service: Gynecology;  Laterality: N/A;  Request 7:30am in [place] block time  requests one hour   [place] N/A 10/03/2019   Procedure: DILATATION & CURETTAGE/HYSTEROSCOPY WITH MYOSURE;  Surgeon: [name], MD;  Location: [facility];   Service: Gynecology;  Laterality: N/A;  request 8:30am OR time in [place] [facility] block requests 45 minutes  right lumpectomy  1970   benign     FAMILY HISTORY: Family History  Problem Relation Age of Onset   Hypertension Mother    Heart attack Father    Stroke Father    Aneurysm Neg Hx      SOCIAL HISTORY: Social History   Socioeconomic History   Marital status: Married    Spouse name: Trilby Drummer   Number of children: 0   Years of education: 12+   Highest education level: Not on file  Occupational History   Not on file  Tobacco Use   Smoking status: Former    Packs/day: 1.00    Years: 5.00    Pack years: 5.00    Types: Cigarettes    Quit date: 04/22/1979    Years since quitting: 42.1   Smokeless tobacco: Never  Vaping Use   Vaping Use: Never used  Substance and Sexual Activity   Alcohol use: No    Alcohol/week: 0.0 standard drinks   Drug use: No   Sexual activity: Yes    Partners: Male    Birth control/protection: Post-menopausal    Comment: 1ST Intercourse- 65, partners-  53, married- 67 yrs   Other Topics Concern   Not on file  Social History Narrative   Lives with husband   Caffeine use: none   Right handed   Social Determinants of Health   Financial Resource Strain: Not on file  Food Insecurity: Not on file  Transportation Needs: Not on file  Physical Activity: Not on file  Stress: Not on file  Social Connections: Not on file  Intimate Partner Violence: Not on file      PHYSICAL EXAM  Vitals:   05/23/21 0925  BP: 126/76  Pulse: 62  Weight: 182 lb (82.6 kg)  Height: 5\' 3"  (1.6 m)    Body mass index is 32.24 kg/m.   Generalized: Well developed, in no acute distress  Cardiology: normal rate and rhythm, no murmur auscultated  Respiratory: clear to auscultation bilaterally    Neurological examination  Mentation: Alert oriented to time, place, history taking. Follows all commands speech and language fluent Cranial nerve II-XII: Pupils  were equal round reactive to light. Extraocular movements were full, visual field were full on confrontational test. Facial sensation and strength were normal. Head turning and shoulder shrug  were normal and symmetric. Motor: The motor testing reveals 5 over 5 strength of all 4 extremities. Good symmetric motor tone is noted throughout.  Sensory: Sensory testing is intact to soft touch on all 4 extremities. No evidence of extinction is noted.  Coordination: Cerebellar testing reveals good finger-nose-finger and heel-to-shin bilaterally.  Gait and station: Gait is normal.  Reflexes: Deep tendon reflexes are symmetric and normal in bilateral upper extremities, brisk but symmetric in bilateral pattellar.     DIAGNOSTIC DATA (LABS, IMAGING, TESTING) - I reviewed patient records, labs, notes, testing and imaging myself where available.  Lab Results  Component Value Date   WBC 4.5 05/09/2020   HGB 13.0 05/09/2020   HCT 40.9 05/09/2020   MCV 94.7 05/09/2020   PLT 205 05/09/2020      Component Value Date/Time   NA 140 05/09/2020 2119   NA 139 12/05/2019 1257   K 3.9 05/09/2020 2119   CL 105 05/09/2020 2119   CO2 26 05/09/2020 2119   GLUCOSE 88 05/09/2020 2119   BUN 17 05/09/2020 2119   BUN 15 12/05/2019 1257   CREATININE 1.14 (H) 05/09/2020 2119   CALCIUM 9.0 05/09/2020 2119  PROT 6.6 05/09/2020 2119   PROT 6.4 12/05/2019 1257   ALBUMIN 3.9 05/09/2020 2119   ALBUMIN 4.1 12/05/2019 1257   AST 26 05/09/2020 2119   ALT 28 05/09/2020 2119   ALKPHOS 97 05/09/2020 2119   BILITOT 0.5 05/09/2020 2119   BILITOT 0.6 12/05/2019 1257   GFRNONAA 52 (L) 05/09/2020 2119   GFRAA 62 12/05/2019 1257   Lab Results  Component Value Date   CHOL 127 12/05/2019   HDL 67 12/05/2019   LDLCALC 50 12/05/2019   LDLDIRECT 121.6 10/13/2006   TRIG 42 12/05/2019   CHOLHDL 1.9 12/05/2019   Lab Results  Component Value Date   HGBA1C 5.8 (H) 11/28/2015   No results found for: [id number] Lab Results   Component Value Date   TSH 1.480 11/28/2015    No flowsheet data found.   No flowsheet data found.   ASSESSMENT AND PLAN  71 y.o. year old female  has a past medical history of Aneurysm ([place]), Arthritis, Cataract, Chronic kidney disease, Hypertension, OSA on CPAP, Osteopenia (01/2017), Tachycardia, and Vitamin D deficiency. here with   Nonruptured cerebral aneurysm  Christine Steele is doing well, today. She denies any concerns of headaches, numbness, weakness or dizziness since last being seen. Last CT angio 07/2020 showed stable 20mm aneurysm of right MCA. Imaging has been stable since 2011. Blood pressures are well managed. She is followed closely by PCP. I have educated her on the important of blood pressure and cholesterol management. She will continue stroke precautions with PCP. Healthy lifestyle habits encouraged. She will return to see me as needed.    No orders of the defined types were placed in this encounter.    No orders of the defined types were placed in this encounter.     Debbora Presto, MSN, FNP-C 05/23/2021, 9:45 AM  [facility] [address] [place], [place] [address] [phone]

## 2021-05-22 NOTE — Patient Instructions (Signed)
Below is our plan:  We will continue to monitor.   Please make sure you are staying well hydrated. I recommend 50-60 ounces daily. Well balanced diet and regular exercise encouraged. Consistent sleep schedule with 6-8 hours recommended.   Please continue follow up with care team as directed.   Follow up as needed   You may receive a survey regarding today's visit. I encourage you to leave honest feed back as I do use this information to improve patient care. Thank you for seeing me today!

## 2021-05-23 ENCOUNTER — Ambulatory Visit (INDEPENDENT_AMBULATORY_CARE_PROVIDER_SITE_OTHER): Payer: Medicare Other | Admitting: Family Medicine

## 2021-05-23 ENCOUNTER — Encounter: Payer: Self-pay | Admitting: Family Medicine

## 2021-05-23 VITALS — BP 126/76 | HR 62 | Ht 63.0 in | Wt 182.0 lb

## 2021-05-23 DIAGNOSIS — I671 Cerebral aneurysm, nonruptured: Secondary | ICD-10-CM | POA: Diagnosis not present

## 2021-06-04 ENCOUNTER — Telehealth: Payer: Self-pay | Admitting: Family Medicine

## 2021-06-04 NOTE — Telephone Encounter (Signed)
Please let her know that she may continue annual follow up with PCP for aneurysm monitoring as directed by guidelines. Her aneurysm is small (27mm) and has remained stable since 2011. May repeat CTA every 2-5 years per Up to Date guidelines. We discussed this in the office and she asked that I confirm with Dr Jaynee Eagles. Dr Jaynee Eagles agrees with plan. She may call us with any concerns. Follow up with [place] as needed.

## 2021-06-05 NOTE — Telephone Encounter (Signed)
Called and spoke with pt. Relayed AL,NP message. Pt verbalized understanding. She wanted to make sure PCP informed as well. Advised I will confirm this w/ NP.

## 2021-06-14 ENCOUNTER — Encounter: Payer: Self-pay | Admitting: Obstetrics & Gynecology

## 2021-06-14 ENCOUNTER — Other Ambulatory Visit: Payer: Self-pay

## 2021-06-14 ENCOUNTER — Ambulatory Visit (INDEPENDENT_AMBULATORY_CARE_PROVIDER_SITE_OTHER): Payer: Medicare Other | Admitting: Obstetrics & Gynecology

## 2021-06-14 VITALS — BP 120/72 | HR 72 | Resp 16 | Ht 62.25 in | Wt 186.0 lb

## 2021-06-14 DIAGNOSIS — M810 Age-related osteoporosis without current pathological fracture: Secondary | ICD-10-CM

## 2021-06-14 DIAGNOSIS — Z01419 Encounter for gynecological examination (general) (routine) without abnormal findings: Secondary | ICD-10-CM

## 2021-06-14 DIAGNOSIS — M85852 Other specified disorders of bone density and structure, left thigh: Secondary | ICD-10-CM

## 2021-06-14 DIAGNOSIS — Z780 Asymptomatic menopausal state: Secondary | ICD-10-CM

## 2021-06-14 DIAGNOSIS — E66811 Obesity, class 1: Secondary | ICD-10-CM

## 2021-06-14 DIAGNOSIS — E6609 Other obesity due to excess calories: Secondary | ICD-10-CM | POA: Diagnosis not present

## 2021-06-14 DIAGNOSIS — Z6833 Body mass index (BMI) 33.0-33.9, adult: Secondary | ICD-10-CM

## 2021-06-14 DIAGNOSIS — Z9189 Other specified personal risk factors, not elsewhere classified: Secondary | ICD-10-CM

## 2021-06-14 NOTE — Progress Notes (Signed)
Christine Steele [birth year] [id number]   History:    71 y.o. G2P1A1L1 Married.  Caring for her mother who lives with them.   RP:  Established patient presenting for annual gyn exam    HPI:  S/P hysteroscopy with MyoSure excision and D&C on October 03, 2019.  Patho benign endometrial polyp.  Postmenopause.  No HRT.  No PMB x HSC 09/2019.  No pelvic pain.  No pain with IC. Pap Neg 05/2019. Breasts wnl.  Mammo Neg 08/2020.Mictions/BMs wnl.  BMI increased to 33.75.  Not exercising regularly.  BD Osteopenia in 01/2017.  Schedule BD here now.  Health labs with Fam MD.  Arlean Hopping for [facility], schedule with Gertie Fey now.   Past medical history,surgical history, family history and social history were all reviewed and documented in the EPIC chart.  Gynecologic History No LMP recorded. Patient is postmenopausal.  Obstetric History OB History  Gravida Para Term Preterm AB Living  1 0     1    SAB IAB Ectopic Multiple Live Births  1            # Outcome Date GA Lbr Len/2nd Weight Sex Delivery Anes PTL Lv  1 SAB              ROS: A ROS was performed and pertinent positives and negatives are included in the history.  GENERAL: No fevers or chills. HEENT: No change in vision, no earache, sore throat or sinus congestion. NECK: No pain or stiffness. CARDIOVASCULAR: No chest pain or pressure. No palpitations. PULMONARY: No shortness of breath, cough or wheeze. GASTROINTESTINAL: No abdominal pain, nausea, vomiting or diarrhea, melena or bright red blood per rectum. GENITOURINARY: No urinary frequency, urgency, hesitancy or dysuria. MUSCULOSKELETAL: No joint or muscle pain, no back pain, no recent trauma. DERMATOLOGIC: No rash, no itching, no lesions. ENDOCRINE: No polyuria, polydipsia, no heat or cold intolerance. No recent change in weight. HEMATOLOGICAL: No anemia or easy bruising or bleeding. NEUROLOGIC: No headache, seizures, numbness, tingling or weakness. PSYCHIATRIC: No depression, no loss of interest in normal  activity or change in sleep pattern.     Exam:   BP 120/72    Pulse 72    Resp 16    Ht 5' 2.25" (1.581 m)    Wt 186 lb (84.4 kg)    BMI 33.75 kg/m   Body mass index is 33.75 kg/m.  General appearance : Well developed well nourished female. No acute distress HEENT: Eyes: no retinal hemorrhage or exudates,  Neck supple, trachea midline, no carotid bruits, no thyroidmegaly Lungs: Clear to auscultation, no rhonchi or wheezes, or rib retractions  Heart: Regular rate and rhythm, no murmurs or gallops Breast:Examined in sitting and supine position were symmetrical in appearance, no palpable masses or tenderness,  no skin retraction, no nipple inversion, no nipple discharge, no skin discoloration, no axillary or supraclavicular lymphadenopathy Abdomen: no palpable masses or tenderness, no rebound or guarding Extremities: no edema or skin discoloration or tenderness  Pelvic: Vulva: Normal             Vagina: No gross lesions or discharge  Cervix: No gross lesions or discharge  Uterus  AV, normal size, shape and consistency, non-tender and mobile  Adnexa  Without masses or tenderness  Anus: Normal   Assessment/Plan:  71 y.o. female for annual exam   1. Well female exam with routine gynecological exam S/P hysteroscopy with MyoSure excision and D&C on October 03, 2019.  Patho benign endometrial polyp.  Postmenopause.  No HRT.  No PMB x HSC 09/2019.  No pelvic pain.  No pain with IC. Pap Neg 05/2019. Breasts wnl.  Mammo Neg 08/2020.Mictions/BMs wnl.  BMI increased to 33.75.  Not exercising regularly.  BD Osteopenia in 01/2017.  Schedule BD here now.  Health labs with Fam MD.  Arlean Hopping for [facility], schedule with Gertie Fey now.  2. At risk of fracture due to osteopenia  3. Postmenopause Postmenopause.  No HRT.  No PMB x HSC 09/2019.  No pelvic pain.  No pain with IC. - DG Bone Density; Future  4. Osteopenia of neck of left femur BD Osteopenia in 01/2017.  Schedule BD here now. Ca++ total of 1.5 g/d,  Vit D supplement, regular weight bearing physical activities. - DG Bone Density; Future  5. Other specified personal risk factors, not elsewhere classified  6. Class 1 obesity due to excess calories with serious comorbidity and body mass index (BMI) of 33.0 to 33.9 in adult  Low calorie/carb diet.  Increase fitness activities.  [name] MD, 10:24 AM 06/14/2021

## 2021-06-21 ENCOUNTER — Telehealth: Payer: Self-pay | Admitting: Cardiovascular Disease

## 2021-06-21 MED ORDER — ATENOLOL 25 MG PO TABS: 37.5000 mg | ORAL_TABLET | Freq: Every day | ORAL | 0 refills | Status: DC

## 2021-06-21 MED ORDER — ROSUVASTATIN CALCIUM 10 MG PO TABS: 10.0000 mg | ORAL_TABLET | Freq: Every day | ORAL | 0 refills | Status: DC

## 2021-06-21 NOTE — Telephone Encounter (Signed)
?*  STAT* If patient is at the pharmacy, call can be transferred to refill team. ? ? ?1. Which medications need to be refilled? (please list name of each medication and dose if known)  ?rosuvastatin (CRESTOR) 10 MG tablet ?atenolol (TENORMIN) 25 MG tablet ? ?2. Which pharmacy/location (including street and city if local pharmacy) is medication to be sent to? [id number] - [place], [place] - [address] AT [address] ? ?3. Do they need a 30 day or 90 day supply? 90 day  ?

## 2021-06-21 NOTE — Telephone Encounter (Signed)
Rx(s) sent to pharmacy electronically.  

## 2021-07-10 ENCOUNTER — Encounter (HOSPITAL_BASED_OUTPATIENT_CLINIC_OR_DEPARTMENT_OTHER): Payer: Self-pay | Admitting: Family

## 2021-07-10 ENCOUNTER — Other Ambulatory Visit: Payer: Self-pay

## 2021-07-10 ENCOUNTER — Ambulatory Visit (INDEPENDENT_AMBULATORY_CARE_PROVIDER_SITE_OTHER): Payer: Medicare Other | Admitting: Family

## 2021-07-10 VITALS — BP 114/52 | HR 69 | Ht 63.0 in | Wt 187.8 lb

## 2021-07-10 DIAGNOSIS — K219 Gastro-esophageal reflux disease without esophagitis: Secondary | ICD-10-CM | POA: Diagnosis not present

## 2021-07-10 DIAGNOSIS — G4733 Obstructive sleep apnea (adult) (pediatric): Secondary | ICD-10-CM

## 2021-07-10 DIAGNOSIS — E782 Mixed hyperlipidemia: Secondary | ICD-10-CM | POA: Diagnosis not present

## 2021-07-10 DIAGNOSIS — I25118 Atherosclerotic heart disease of native coronary artery with other forms of angina pectoris: Secondary | ICD-10-CM

## 2021-07-10 DIAGNOSIS — I10 Essential (primary) hypertension: Secondary | ICD-10-CM | POA: Diagnosis not present

## 2021-07-10 MED ORDER — IRBESARTAN 300 MG PO TABS: 150.0000 mg | ORAL_TABLET | Freq: Every day | ORAL | Status: DC

## 2021-07-10 MED ORDER — ATENOLOL 25 MG PO TABS: 37.5000 mg | ORAL_TABLET | Freq: Every day | ORAL | 1 refills | Status: DC

## 2021-07-10 MED ORDER — ROSUVASTATIN CALCIUM 10 MG PO TABS: 10.0000 mg | ORAL_TABLET | Freq: Every day | ORAL | 1 refills | Status: DC

## 2021-07-10 MED ORDER — PANTOPRAZOLE SODIUM 20 MG PO TBEC: 20.0000 mg | DELAYED_RELEASE_TABLET | Freq: Every day | ORAL | 2 refills | Status: DC

## 2021-07-10 NOTE — Patient Instructions (Addendum)
Medication Instructions:  ?Your physician has recommended you make the following change in your medication:  ? ?REDUCE Irbesartan to '150mg'$  (half tablet) once per day  ? ?*If you need a refill on your cardiac medications before your next appointment, please call your pharmacy* ? ? ?Lab Work: ?Your physician recommends that you return for lab work one day this week or next for direct LDL, TSH, CBC, CMP. ? ?Please return for Lab work. You may come to the...  ? ?[facility] (3rd floor) ?[address], [place], [place]  ?Open: 8am-Noon and 1pm-4:30pm  ? ?[place] [facility] ?[place]  ? ?[employer]- Any location ? ?**no appointments needed**  ? ?If you have labs (blood work) drawn today and your tests are completely normal, you will receive your results only by: ?MyChart Message (if you have MyChart) OR ?A paper copy in the mail ?If you have any lab test that is abnormal or we need to change your treatment, we will call you to review the results. ? ? ?Testing/Procedures: ?Your EKG today showed normal sinus rhythm which is a good result.  ? ? ?Follow-Up: ?At [facility], you and your health needs are our priority.  As part of our continuing mission to provide you with exceptional heart care, we have created designated Provider Care Teams.  These Care Teams include your primary Cardiologist (physician) and Advanced Practice Providers (APPs -  Physician Assistants and Nurse Practitioners) who all work together to provide you with the care you need, when you need it. ? ?We recommend signing up for the patient portal called "MyChart".  Sign up information is provided on this After Visit Summary.  MyChart is used to connect with patients for Virtual Visits (Telemedicine).  Patients are able to view lab/test results, encounter notes, upcoming appointments, etc.  Non-urgent messages can be sent to your provider as well.   ?To learn more about what you can do with  MyChart, go to [email or web address].   ? ?Your next appointment:   ?6-8 week(s) ? ?The format for your next appointment:   ?In Person ? ?Provider:   ?Skeet Latch, MD or Loel Dubonnet, NP   ? ? ?Other Instructions ? ?Tips to Measure your Blood Pressure Correctly ? ?Here's what you can do to ensure a correct reading: ? Don't drink a caffeinated beverage or smoke during the 30 minutes before the test. ? Sit quietly for five minutes before the test begins. ? During the measurement, sit in a chair with your feet on the floor and your arm supported so your elbow is at about heart level. ? The inflatable part of the cuff should completely cover at least 80% of your upper arm, and the cuff should be placed on bare skin, not over a shirt. ? Don't talk during the measurement. ? Have your blood pressure measured twice, with a brief break in between. If the readings are different by 5 points or more, have it done a third time. ? ?In 2017, new guidelines from the [place], the [employer] of Cardiology, and nine other health organizations lowered the diagnosis of high blood pressure to 130/80 mm Hg or higher for all adults. The guidelines also redefined the various blood pressure categories to now include normal, elevated, Stage 1 hypertension, Stage 2 hypertension, and hypertensive crisis (see "Blood pressure categories"). ? ?Blood pressure categories  ?Blood pressure category SYSTOLIC ?(upper number)  DIASTOLIC ?(lower number)  ?Normal Less than 120  mm Hg and Less than 80 mm Hg  ?Elevated 120-129 mm Hg and Less than 80 mm Hg  ?High blood pressure: Stage 1 hypertension 130-139 mm Hg or 80-89 mm Hg  ?High blood pressure: Stage 2 hypertension 140 mm Hg or higher or 90 mm Hg or higher  ?Hypertensive crisis (consult your doctor immediately) Higher than 180 mm Hg and/or Higher than 120 mm Hg  ?Source: American Heart Association and American Stroke Association. ?For more on getting your blood  pressure under control, buy Controlling Your Blood Pressure, a Special Health Report from [facility]. ? ? ?Blood Pressure Log ? ? ?Date ?  ?Time  ?Blood Pressure  ?Example: Nov 1 9 AM 124/78  ? ?    ? ?    ? ?    ? ?    ? ?    ? ?    ? ?    ? ?    ? ? ?  Food Choices for Gastroesophageal Reflux Disease, Adult ?When you have gastroesophageal reflux disease (GERD), the foods you eat and your eating habits are very important. Choosing the right foods can help ease your discomfort. Think about working with a food expert (dietitian) to help you make good choices. ?What are tips for following this plan? ?Reading food labels ?Look for foods that are low in saturated fat. Foods that may help with your symptoms include: ?Foods that have less than 5% of daily value (DV) of fat. ?Foods that have 0 grams of trans fat. ?Cooking ?Do not fry your food. ?Cook your food by baking, steaming, grilling, or broiling. These are all methods that do not need a lot of fat for cooking. ?To add flavor, try to use herbs that are low in spice and acidity. ?Meal planning ? ?Choose healthy foods that are low in fat, such as: ?Fruits and vegetables. ?Whole grains. ?Low-fat dairy products. ?Lean meats, fish, and poultry. ?Eat small meals often instead of eating 3 large meals each day. Eat your meals slowly in a place where you are relaxed. Avoid bending over or lying down until 2-3 hours after eating. ?Limit high-fat foods such as fatty meats or fried foods. ?Limit your intake of fatty foods, such as oils, butter, and shortening. ?Avoid the following as told by your doctor: ?Foods that cause symptoms. These may be different for different people. Keep a food diary to keep track of foods that cause symptoms. ?Alcohol. ?Drinking a lot of liquid with meals. ?Eating meals during the 2-3 hours before bed. ?Lifestyle ?Stay at a healthy weight. Ask your doctor what weight is healthy for you. If you need to lose weight, work with your doctor to  do so safely. ?Exercise for at least 30 minutes on 5 or more days each week, or as told by your doctor. ?Wear loose-fitting clothes. ?Do not smoke or use any products that contain nicotine or tobacco. If you need help quitting, ask your doctor. ?Sleep with the head of your bed higher than your feet. Use a wedge under the mattress or blocks under the bed frame to raise the head of the bed. ?Chew sugar-free gum after meals. ?What foods should eat? ?Eat a healthy, well-balanced diet of fruits, vegetables, whole grains, low-fat dairy products, lean meats, fish, and poultry. Each person is different. ?Foods that may cause symptoms in one person may not cause any symptoms in another person. Work with your doctor to find foods that are safe for you. ?The items listed above may  not be a complete list of what you can eat and drink. Contact a food expert for more options. ?What foods should I avoid? ?Limiting some of these foods may help in managing the symptoms of GERD. Everyone is different. Talk with a food expert or your doctor to help you find the exact foods to avoid, if any. ?Fruits ?Any fruits prepared with added fat. Any fruits that cause symptoms. For some people, this may include citrus fruits, such as oranges, grapefruit, pineapple, and lemons. ?Vegetables ?Deep-fried vegetables. [place] fries. Any vegetables prepared with added fat. Any vegetables that cause symptoms. For some people, this may include tomatoes and tomato products, chili peppers, onions and garlic, and horseradish. ?Grains ?Pastries or quick breads with added fat. ?Meats and other proteins ?High-fat meats, such as fatty beef or pork, hot dogs, ribs, ham, sausage, salami, and bacon. Fried meat or protein, including fried fish and fried chicken. Nuts and nut butters, in large amounts. ?Dairy ?Whole milk and chocolate milk. Sour cream. Cream. Ice cream. Cream cheese. Milkshakes. ?Fats and oils ?Butter. Margarine. Shortening. Ghee. ?Beverages ?Coffee  and tea, with or without caffeine. Carbonated beverages. Sodas. Energy drinks. Fruit juice made with acidic fruits, such as orange or grapefruit. Tomato juice. Alcoholic drinks. ?Sweets and desserts ?Chocola

## 2021-07-10 NOTE — Progress Notes (Signed)
? ?Office Visit  ?  ?Patient Name: Christine Steele ?Date of Encounter: 07/12/2021 ? ?PCP:  [name], MD ?  ?[place]  ?Cardiologist:  Skeet Latch, MD  ?Advanced Practice Provider:  No care team member to display ?Electrophysiologist:  None  ?   ? ?Chief Complaint  ?  ?Christine Steele is a 71 y.o. female with a hx of CAD, HTN, brain aneurysm, OSA on CPAP presents today for shortness of breath  ? ?Past Medical History  ?  ?Past Medical History:  ?Diagnosis Date  ? Aneurysm ([place])   ? 22m Right MCA  ? Arthritis   ? Cataract   ? Bilateral  ? Chronic kidney disease   ? Dr. PShelia Media(PCP)   ? Hypertension   ? OSA on CPAP   ? havent used CPAP in long time per patient due to unable to get filters to change out  ? Osteopenia 01/2017  ? T score -1.4 FRAX 3.4% / 0.4%  ? Tachycardia   ? Vitamin D deficiency   ? ?Past Surgical History:  ?Procedure Laterality Date  ? BIOPSY BREAST  1970  ? Right Breast  ? COLONOSCOPY    ? DILATATION & CURETTAGE/HYSTEROSCOPY WITH MYOSURE N/A 03/06/2017  ? [place]  Surgeon: LPrincess Bruins MD;  [facility]  Service: Gynecology;  Laterality: N/A;  Request 7:30am in [facility] [place] time ? ?requests one hour  ? DILATATION & CURETTAGE/HYSTEROSCOPY WITH MYOSURE N/A 10/03/2019  ? [place]  Surgeon: LPrincess Bruins MD;  [facility]  Service: Gynecology;  Laterality: N/A;  request 8:30am [place] time in [place] ?requests 45 minutes  ? right lumpectomy  1970  ? benign  ? ? ?Allergies ? ?Allergies  ?Allergen Reactions  ? Amlodipine Swelling and Other (See Comments)  ?  Leg/feet swelling.  ? Chocolate Other (See Comments)  ?  Headache  ? Coconut Fatty Acids Other (See Comments)  ? Hydralazine Other (See Comments)  ?  ? Fast heart rate  ? Other Other (See Comments)  ?  MSG causes headaches  ?  Peanut-Containing Drug Products Other (See Comments)  ?  All peanuts; headache  ? ? ?History of Present Illness  ?  ?LMIKELLE MYRICKis a 71y.o. female with a hx of moderate coronary disease, brain aneurysm, hypertension, OSA on CPAP last seen 11/2019. ? ?Seen [id number] chest discomfort with myoview 03/2018 negative for ischemia. Due to continued atypical symptoms cardiac CTA performed 06/2019 with moderate LAD disease and calcium score 98th percentile.  ? ?Prior adjustments to antihypertensive regimen include discontinuation of HCTZ due to weakness, transition from Losartan to Irbesartan. Hydralazine added 08/2019 due to uncontrolled BP but did not tolerate and Atenolol instead increased. She [facility] PREP [facility]  ? ?Presents today for follow up. Shares with me in May she started caretaking for her mother who has dementia. Not exercising regularly recently. Notes increased exertional dyspnea which she attributes to inactivity. No chest pain, orthopnea, edema, PND, palpitations. Notes her BP has been running low with SBP 110s associated with fatigue.  ? ?EKGs/Labs/Other Studies Reviewed:  ? ?The following studies were reviewed today: ? ?Cardiac CTA 06/2019 ?FINDINGS: ?[facility]card[facility] ?  ?Pulmonary veins drain normally to the left atrium. ?  ?Calcium Score: 968 Agatston units. ?  ?Coronary Arteries: Right dominant with high anterior take-off of  the ?RCA above the right cusp. ?  ?LM: Calcified plaque distal left main, mild (<50%) stenosis. ?  ?LAD system: Extensive calcified plaque ostial/proximal LAD. There is ?significant blooming artifact, very difficult to comment on degree ?of stenosis. Could be moderate range (51-69%), does not appear to be ?critical. ?  ?Circumflex system: No significant plaque or stenosis. ?  ?RCA system: High anterior take-off above the right cusp. Calcified ?plaque proximal RCA with mild (<50%) stenosis. ?  ?IMPRESSION: ?1. Coronary  artery calcium score 968 Agatston units. This places the ?patient in the 98th percentile for age and gender, suggesting high ?risk for future cardiac events. ?  ?2.  Suspect mild proximal RCA stenosis. ?  ?3. Extensive calcified plaque in the ostial/proximal LAD. Blooming ?artifact makes degree of stenosis hard to comment upon. Possible ?moderate-range stenosis, doubt critical. Will send for FFR. ? ?FFR 06/2019 ?TECHNIQUE: ?The coronary CTA was sent for FFR. ?  ?FINDINGS: ?Unable to interpret study due to extent of motion artifact. ?  ?Lexiscan Myoview  04/02/18: ?Nuclear stress EF: 52%. The left ventricular ejection fraction is mildly decreased (45-54%). ?T wave inversion was noted during stress in the V4, V5, aVF and V3 leads. ?This is a low risk study. No evidence of ischemia. No previous MI ?The study is normal. ?  ? ?EKG:  EKG is ordered today.  The ekg ordered today demonstrates NSR 67 bpm with no acute ST/T wave changes.  ? ?Recent Labs: ?No results found for requested labs within last 8760 hours.  ?Recent Lipid Panel ?   ?Component Value Date/Time  ? CHOL 127 12/05/2019 1257  ? TRIG 42 12/05/2019 1257  ? HDL 67 12/05/2019 1257  ? CHOLHDL 1.9 12/05/2019 1257  ? CHOLHDL 2.6 10/28/2009 0455  ? VLDL 11 10/28/2009 0455  ? LDLCALC 50 12/05/2019 1257  ? LDLDIRECT 121.6 10/13/2006 1729  ? ?Home Medications  ? ?Current Meds  ?Medication Sig  ? Ascorbic Acid (VITAMIN C PO) Take 1 tablet by mouth daily.  ? aspirin EC 81 MG tablet Take 81 mg by mouth daily.   ? cholecalciferol (VITAMIN D3) 25 MCG (1000 UNIT) tablet Take 1,000 Units by mouth daily.  ? hydrochlorothiazide (HYDRODIURIL) 25 MG tablet Take 25 mg by mouth daily.  ? Multiple Vitamin (MULTIVITAMIN WITH MINERALS) TABS tablet Take 1 tablet by mouth daily.  ? pantoprazole (PROTONIX) 20 MG tablet Take 1 tablet (20 mg total) by mouth daily.  ? [DISCONTINUED] atenolol (TENORMIN) 25 MG tablet Take 1.5 tablets (37.5 mg total) by mouth daily. Keep appointment for further  refills  ? [DISCONTINUED] irbesartan (AVAPRO) 300 MG tablet Take 300 mg by mouth daily.  ? [DISCONTINUED] rosuvastatin (CRESTOR) 10 MG tablet Take 1 tablet (10 mg total) by mouth daily at 6 PM. Keep appointment for further refills  ?  ? ?Review of Systems  ?    ?All other systems reviewed and are otherwise negative except as noted above. ? ?Physical Exam  ?  ?VS:  BP (!) 114/52   Pulse 69   Ht '5\' 3"'$  (1.6 m)   Wt 187 lb 12.8 oz (85.2 kg)   SpO2 99%   BMI 33.27 kg/m?  , BMI Body mass index is 33.27 kg/m?. ? ?Wt Readings from Last 3 Encounters:  ?07/10/21 187 lb 12.8 oz (85.2 kg)  ?06/14/21 186 lb (84.4 kg)  ?05/23/21 182 lb (82.6 kg)  ?  ?GEN: Well nourished, overweight, well developed, in no acute distress. ?HEENT: normal. ?Neck: Supple, no JVD, carotid bruits, or  masses. ?Cardiac: RRR, no murmurs, rubs, or gallops. No clubbing, cyanosis, edema. Radials/PT 2+ and equal bilaterally.  ?Respiratory:  Respirations regular and unlabored, clear to auscultation bilaterally. ?GI: Soft, nontender, nondistended. ?MS: No deformity or atrophy. ?Skin: Warm and dry, no rash. ?Neuro:  Strength and sensation are intact. ?Psych: Normal affect. ? ?Assessment & Plan  ?  ?HTN  / Lightheadedness -endorses lightheadedness associated with hypotension.  Reduce irbesartan to 150 mg daily. CBC, CMP today.  ? ?CAD - Stable with no anginal symptoms. No indication for ischemic evaluation.  GDMT includes Atenolol, aspirin, rosuvastatin.  ? ?Fatigue - Likely due to hypotension. Will update TSH to rule out thyroid abnormality. ? ?Overweight - Likely etiology of her exertional dyspnea as most notable when less active over the last few months. Weight loss via diet and exercise encouraged. Discussed the impact being overweight would have on cardiovascular risk.  ? ?HLD - update direct LDL today. LDL goal <70. Continue Rosuvastatin '10mg'$  QD. ? ?Disposition: Follow up  6-8 weeks  with Skeet Latch, MD or APP. ? ?Signed, ?Loel Dubonnet,  NP ?07/12/2021, 5:02 PM ?[place] ?

## 2021-07-18 ENCOUNTER — Encounter (HOSPITAL_BASED_OUTPATIENT_CLINIC_OR_DEPARTMENT_OTHER): Payer: Self-pay

## 2021-08-21 DIAGNOSIS — R5383 Other fatigue: Secondary | ICD-10-CM | POA: Diagnosis not present

## 2021-08-21 DIAGNOSIS — E559 Vitamin D deficiency, unspecified: Secondary | ICD-10-CM | POA: Diagnosis not present

## 2021-08-21 DIAGNOSIS — I10 Essential (primary) hypertension: Secondary | ICD-10-CM | POA: Diagnosis not present

## 2021-08-28 DIAGNOSIS — I671 Cerebral aneurysm, nonruptured: Secondary | ICD-10-CM | POA: Diagnosis not present

## 2021-08-28 DIAGNOSIS — I10 Essential (primary) hypertension: Secondary | ICD-10-CM | POA: Diagnosis not present

## 2021-08-28 DIAGNOSIS — E559 Vitamin D deficiency, unspecified: Secondary | ICD-10-CM | POA: Diagnosis not present

## 2021-08-28 DIAGNOSIS — Z0000 Encounter for general adult medical examination without abnormal findings: Secondary | ICD-10-CM | POA: Diagnosis not present

## 2021-08-28 DIAGNOSIS — E7800 Pure hypercholesterolemia, unspecified: Secondary | ICD-10-CM | POA: Diagnosis not present

## 2021-08-28 DIAGNOSIS — I2510 Atherosclerotic heart disease of native coronary artery without angina pectoris: Secondary | ICD-10-CM | POA: Diagnosis not present

## 2021-08-28 DIAGNOSIS — N1831 Chronic kidney disease, stage 3a: Secondary | ICD-10-CM | POA: Diagnosis not present

## 2021-08-28 DIAGNOSIS — Z6832 Body mass index (BMI) 32.0-32.9, adult: Secondary | ICD-10-CM | POA: Diagnosis not present

## 2021-08-28 DIAGNOSIS — E6609 Other obesity due to excess calories: Secondary | ICD-10-CM | POA: Diagnosis not present

## 2021-08-29 DIAGNOSIS — M25571 Pain in right ankle and joints of right foot: Secondary | ICD-10-CM | POA: Diagnosis not present

## 2021-09-08 IMAGING — MG MM DIGITAL SCREENING BILAT W/ TOMO AND CAD
6 of 10 series · 6 of 30 positions shown · non-contrast
Comparison: Previous exam(s).

CLINICAL DATA: Screening.

EXAM:
DIGITAL SCREENING BILATERAL MAMMOGRAM WITH TOMOSYNTHESIS AND CAD
TECHNIQUE: Bilateral screening digital craniocaudal and mediolateral oblique
mammograms were obtained. Bilateral screening digital breast
tomosynthesis was performed. The images were evaluated with
computer-aided detection.

[L MLO synth-2D]
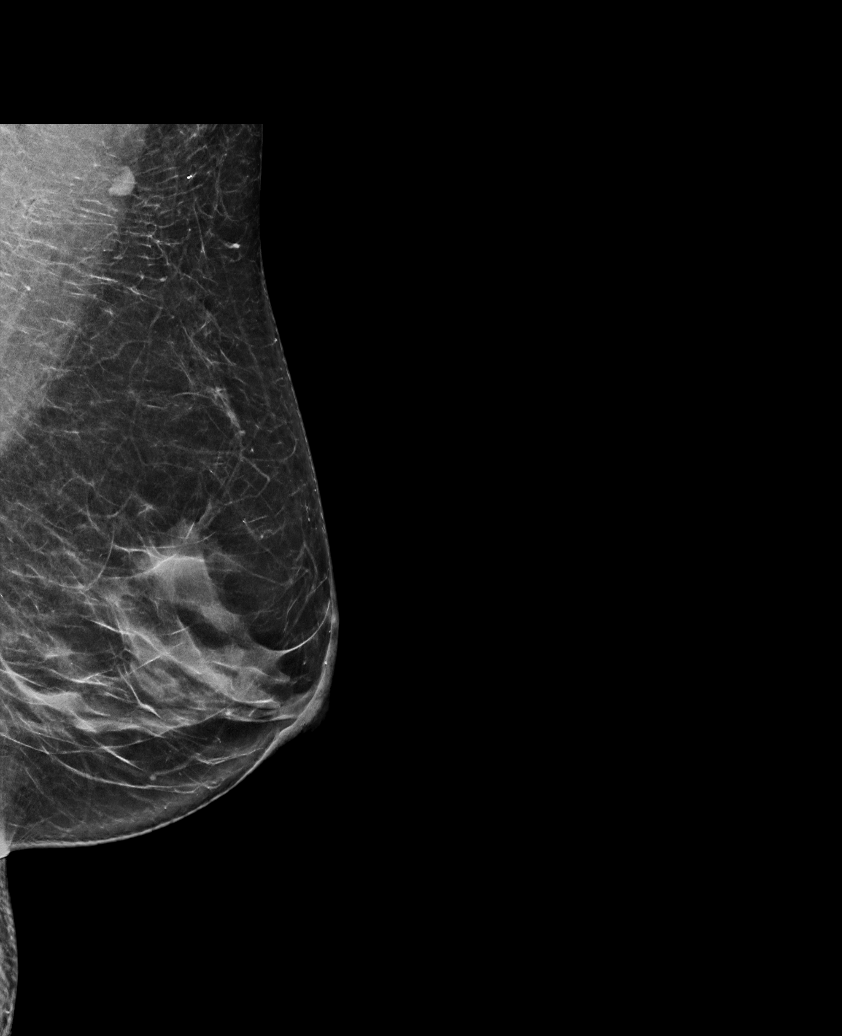

[L CC synth-2D (1 of 2)]
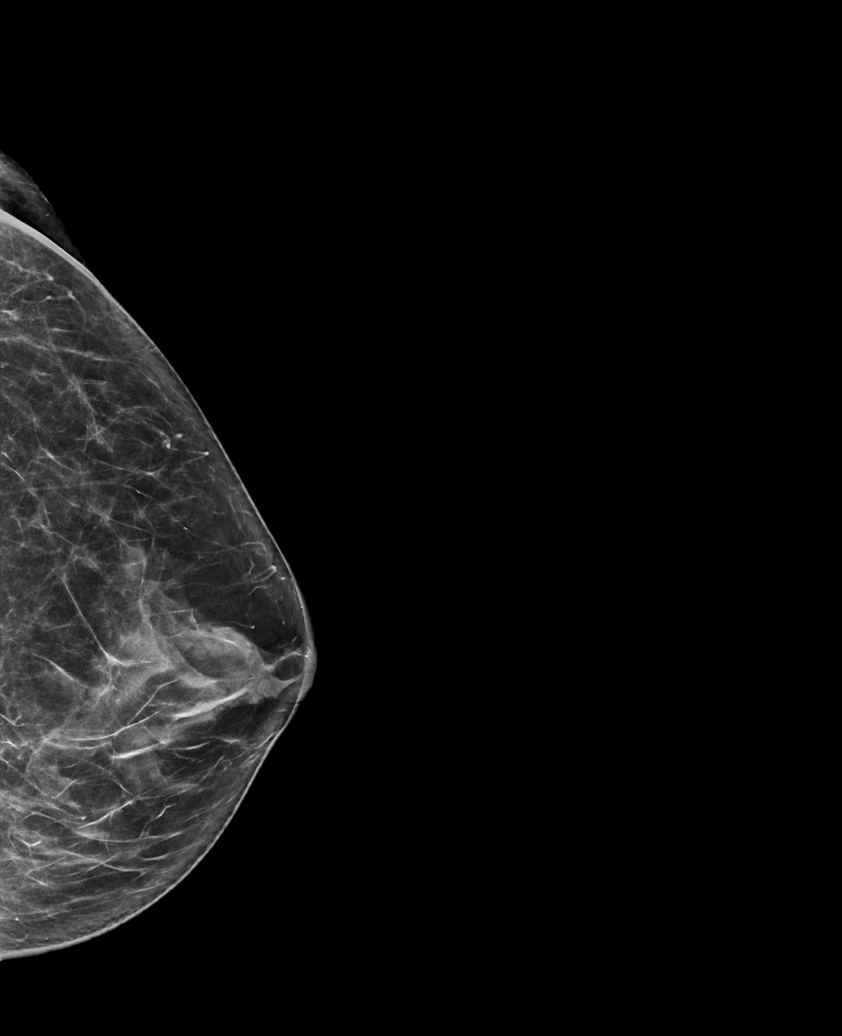

[R CC synth-2D]
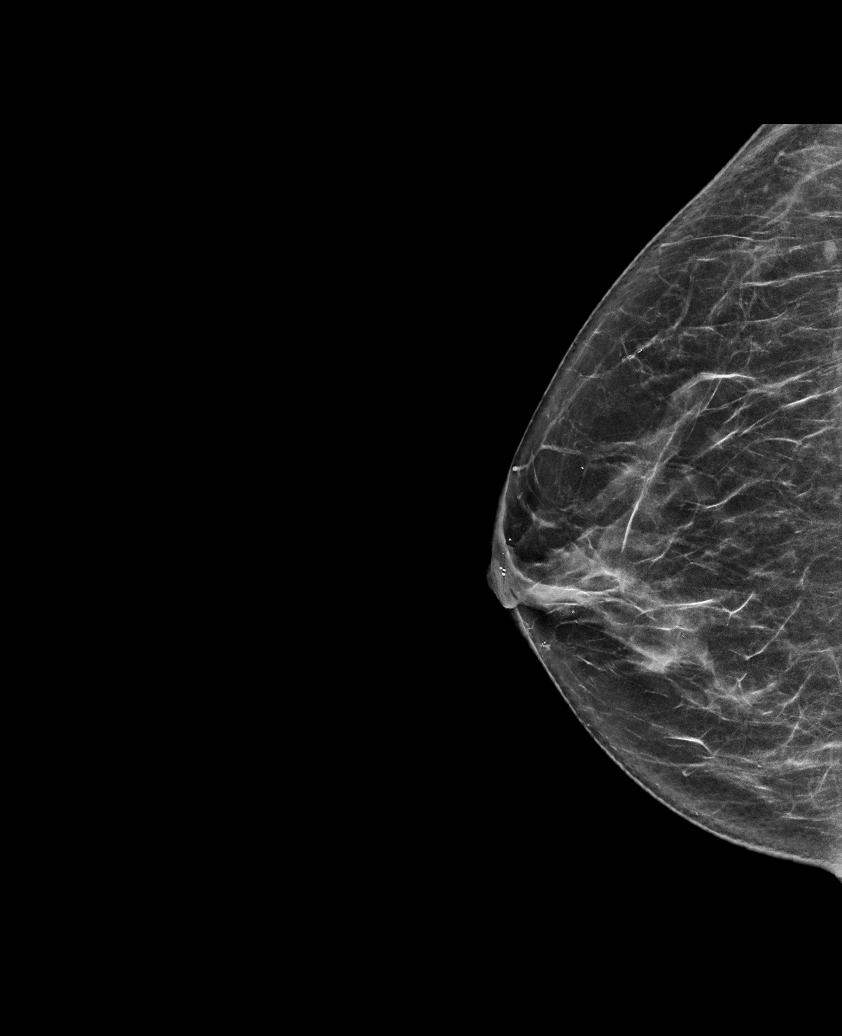

[R MLO synth-2D]
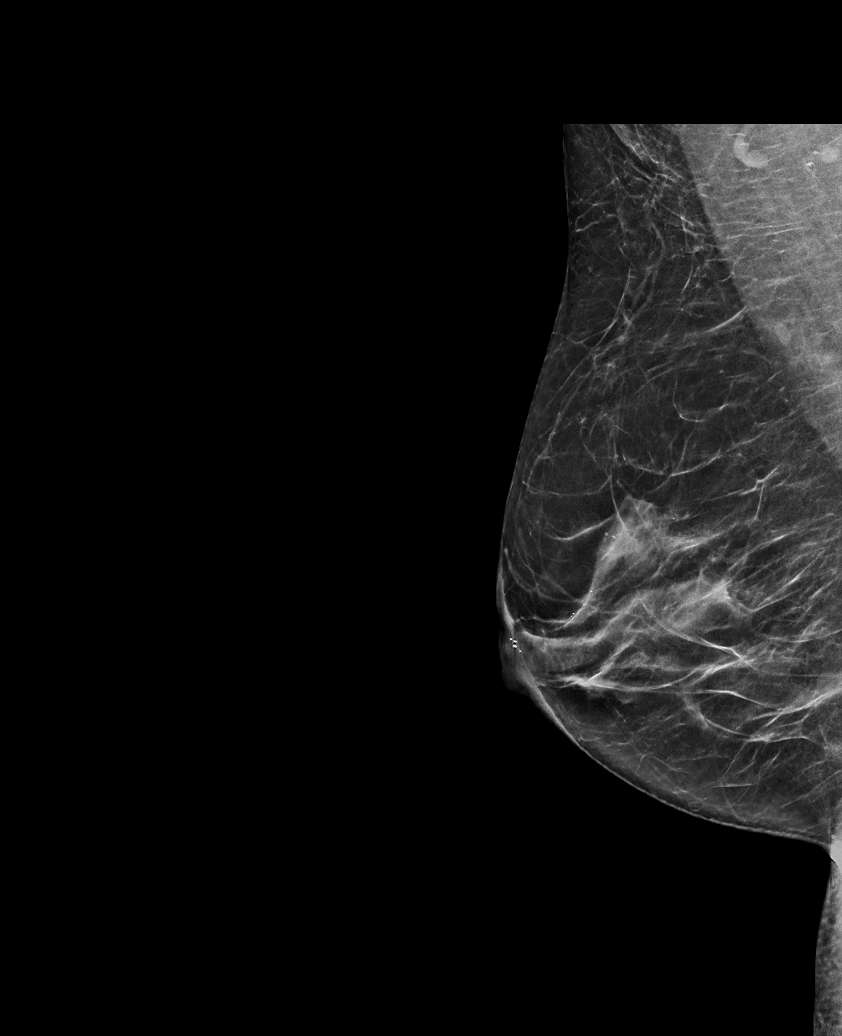

[L CC synth-2D (2 of 2)]
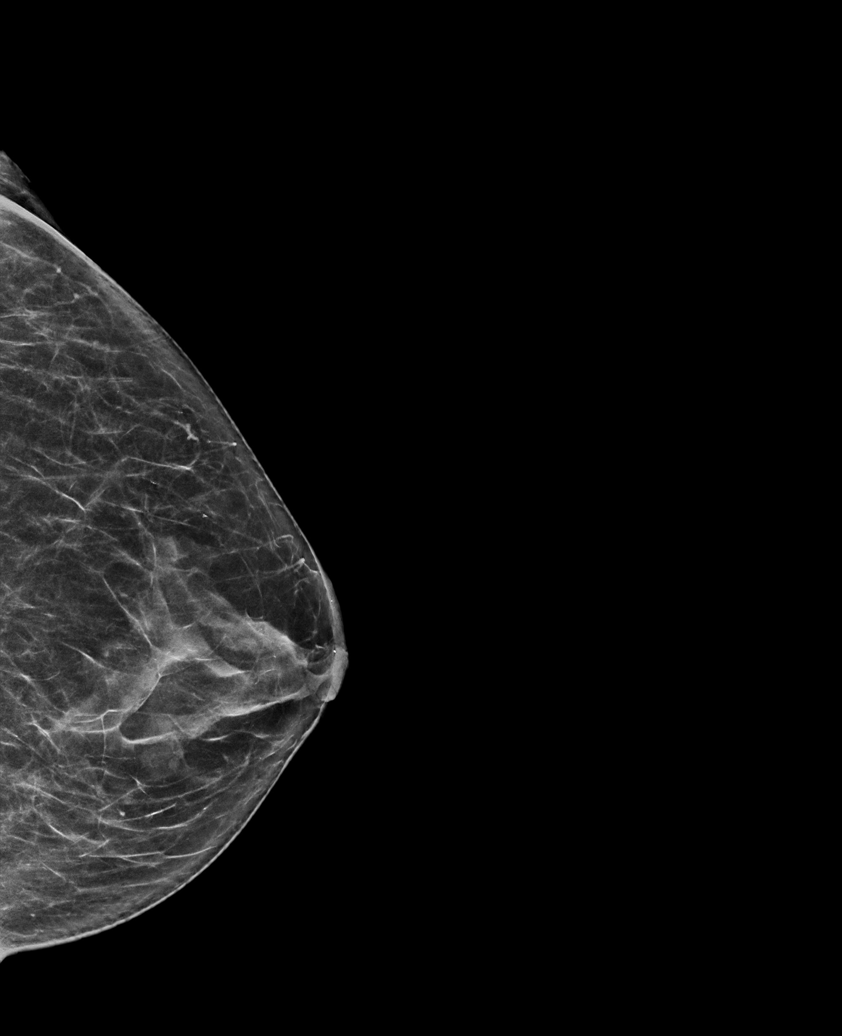

[L CC tomo · tomo slice 32/63.0]
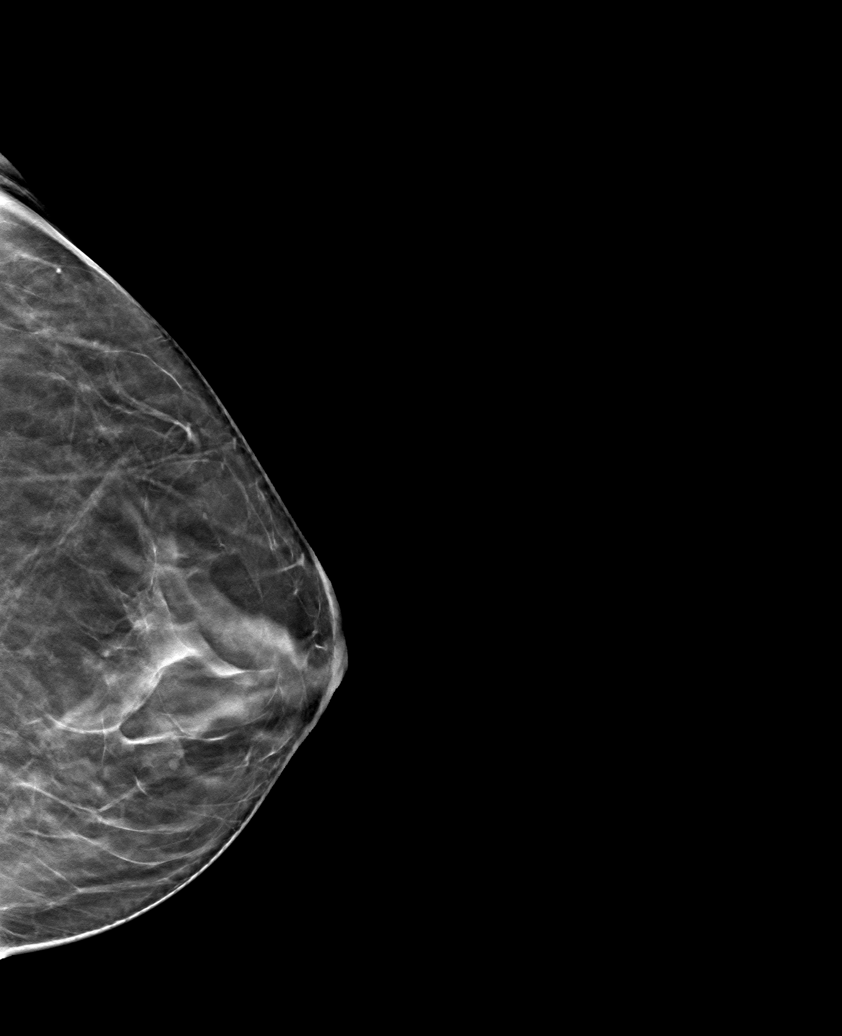

[6 of 30 positions shown; findings below may reference images not displayed]

ACR Breast Density Category c: The breast tissue is heterogeneously
dense, which may obscure small masses.
FINDINGS: There are no findings suspicious for malignancy. The images were
evaluated with computer-aided detection.
IMPRESSION: No mammographic evidence of malignancy. A result letter of this
screening mammogram will be mailed directly to the patient.

RECOMMENDATION:
Screening mammogram in one year. (Code:T4-5-GWO)

BI-RADS CATEGORY  1: Negative.

## 2021-09-11 ENCOUNTER — Ambulatory Visit (INDEPENDENT_AMBULATORY_CARE_PROVIDER_SITE_OTHER): Payer: Medicare Other | Admitting: Family

## 2021-09-11 ENCOUNTER — Encounter (HOSPITAL_BASED_OUTPATIENT_CLINIC_OR_DEPARTMENT_OTHER): Payer: Self-pay | Admitting: Family

## 2021-09-11 VITALS — BP 124/76 | HR 64 | Ht 63.0 in | Wt 187.8 lb

## 2021-09-11 DIAGNOSIS — E785 Hyperlipidemia, unspecified: Secondary | ICD-10-CM

## 2021-09-11 DIAGNOSIS — I25118 Atherosclerotic heart disease of native coronary artery with other forms of angina pectoris: Secondary | ICD-10-CM

## 2021-09-11 DIAGNOSIS — I10 Essential (primary) hypertension: Secondary | ICD-10-CM

## 2021-09-11 MED ORDER — IRBESARTAN 150 MG PO TABS: 150.0000 mg | ORAL_TABLET | Freq: Every day | ORAL | 3 refills | Status: DC

## 2021-09-11 NOTE — Patient Instructions (Signed)
Medication Instructions:  Continue your current medications.   We will send in the '150mg'$  tablet of Irbesartan so that you will only have to take one tablet per day rather than a half tablet of the '300mg'$  tablet.   *If you need a refill on your cardiac medications before your next appointment, please call your pharmacy*   Lab Work: None ordered today.   Testing/Procedures: None ordered today.   Follow-Up: At [facility], you and your health needs are our priority.  As part of our continuing mission to provide you with exceptional heart care, we have created designated Provider Care Teams.  These Care Teams include your primary Cardiologist (physician) and Advanced Practice Providers (APPs -  Physician Assistants and Nurse Practitioners) who all work together to provide you with the care you need, when you need it.  We recommend signing up for the patient portal called "MyChart".  Sign up information is provided on this After Visit Summary.  MyChart is used to connect with patients for Virtual Visits (Telemedicine).  Patients are able to view lab/test results, encounter notes, upcoming appointments, etc.  Non-urgent messages can be sent to your provider as well.   To learn more about what you can do with MyChart, go to [email or web address].    Your next appointment:   6 month(s)  The format for your next appointment:   In Person  Provider:   Skeet Latch, MD or Loel Dubonnet, NP    Other Instructions  Heart Healthy Diet Recommendations: A low-salt diet is recommended. Meats should be grilled, baked, or boiled. Avoid fried foods. Focus on lean protein sources like fish or chicken with vegetables and fruits. The American Heart Association is a Microbiologist!  American Heart Association Diet and Lifeystyle Recommendations    Exercise recommendations: The American Heart Association recommends 150 minutes of moderate intensity exercise weekly. Try 30 minutes of moderate  intensity exercise 4-5 times per week. This could include walking, jogging, or swimming.   Important Information About Sugar

## 2021-09-11 NOTE — Progress Notes (Signed)
Office Visit    Patient Name: Christine Steele Date of Encounter: 09/11/2021  PCP:  [name], [place]  Cardiologist:  Skeet Latch, MD  Advanced Practice Provider:  No care team member to display Electrophysiologist:  None     Chief Complaint    Christine Steele is a 71 y.o. female with a hx of CAD, HTN, brain aneurysm, OSA on CPAP presents today for hypertension  Past Medical History    Past Medical History:  Diagnosis Date   Aneurysm ([place])    17m Right MCA   Arthritis    Cataract    Bilateral   Chronic kidney disease    Dr. PShelia Media(PCP)    Hypertension    OSA on CPAP    havent used CPAP in long time per patient due to unable to get filters to change out   Osteopenia 01/2017   T score -1.4 FRAX 3.4% / 0.4%   Tachycardia    Vitamin D deficiency    Past Surgical History:  Procedure Laterality Date   BIOPSY BREAST  1970   Right Breast   COLONOSCOPY     DILATATION & CURETTAGE/HYSTEROSCOPY WITH MYOSURE N/A 03/06/2017   [place]  Surgeon: LPrincess Bruins MD;  [place]  Service: Gynecology;  Laterality: N/A;  Request 7[place]am [place]  requests one hour   [place]/A 10/03/2019   Procedure: DILATATION & CURETTAGE/HYSTEROSCOPY WITH MYOSURE;  Surgeon: LPrincess Bruins MD;  [place]  Service: Gynecology;  Laterality: N/A;  request 8:30am OR time in [place] requests 45 minutes   right lumpectomy  1970   benign    Allergies  Allergies  Allergen Reactions   Amlodipine Swelling and Other (See Comments)    Leg/feet swelling.   Chocolate Other (See Comments)    Headache   Chocolate Flavor     Other reaction(s): headaches   Coconut Fatty Acids Other (See Comments)   Hydralazine Other (See Comments)    ? Fast heart rate   Hydralazine Hcl     Other  reaction(s): heart rate increase   Other Other (See Comments)    MSG causes headaches   Peanut-Containing Drug Products Other (See Comments)    All peanuts; headache    History of Present Illness    Christine DRESSELis a 71y.o. female with a hx of moderate coronary disease, brain aneurysm, hypertension, OSA on CPAP last seen 07/10/21  Seen in2019 for chest discomfort with myoview 03/2018 negative for ischemia. Due to continued atypical symptoms cardiac CTA performed 06/2019 with moderate LAD disease and calcium score 98th percentile.   Prior adjustments to antihypertensive regimen include discontinuation of HCTZ due to weakness, transition from Losartan to Irbesartan. Hydralazine added 08/2019 due to uncontrolled BP but did not tolerate and Atenolol instead increased. 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 Her BP was running 110s associated with fatigue. Irbesartan reduced to '150mg'$  QD.  She presents today for follow up independently. 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 Tells me her blood pressure will drop on occasion but overall blood pressure has been much better controlled with fewer episodes of hypotension. She feels well on updated medication regimen. Reports no shortness of breath nor  dyspnea on exertion. Reports no chest pain, pressure, or tightness. No edema, orthopnea, PND. Reports no palpitations.    Labs via PCP  08/21/2021 hemoglobin 12.5, ALT 28, AST 20, creatinine 1.21, K4.0, GFR 53 Total cholesterol 132, HDL 69, LDL 53, triglycerides 49  EKGs/Labs/Other Studies Reviewed:   The following studies were reviewed today:  Cardiac CTA 06/2019 FINDINGS: Non-cardiac: See separate report from [facility].   Pulmonary veins drain normally to the left atrium.   Calcium Score: 968 Agatston units.   Coronary Arteries: Right dominant with  high anterior take-off of the RCA above the right cusp.   LM: Calcified plaque distal left main, mild (<50%) stenosis.   LAD system: Extensive calcified plaque ostial/proximal LAD. There is significant blooming artifact, very difficult to comment on degree of stenosis. Could be moderate range (51-69%), does not appear to be critical.   Circumflex system: No significant plaque or stenosis.   RCA system: High anterior take-off above the right cusp. Calcified plaque proximal RCA with mild (<50%) stenosis.   IMPRESSION: 1. Coronary artery calcium score 968 Agatston units. This places the patient in the 98th percentile for age and gender, suggesting high risk for future cardiac events.   2.  Suspect mild proximal RCA stenosis.   3. Extensive calcified plaque in the ostial/proximal LAD. Blooming artifact makes degree of stenosis hard to comment upon. Possible moderate-range stenosis, doubt critical. Will send for FFR.  FFR 06/2019 TECHNIQUE: The coronary CTA was sent for FFR.   FINDINGS: Unable to interpret study due to extent of motion artifact.   Lexiscan Myoview  04/02/18: Nuclear stress EF: 52%. The left ventricular ejection fraction is mildly decreased (45-54%). T wave inversion was noted during stress in the V4, V5, aVF and V3 leads. This is a low risk study. No evidence of ischemia. No previous MI The study is normal.   EKG:  No EKG today.  Recent Labs: No results found for requested labs within last 8760 hours.  Recent Lipid Panel    Component Value Date/Time   CHOL 127 12/05/2019 1257   TRIG 42 12/05/2019 1257   HDL 67 12/05/2019 1257   CHOLHDL 1.9 12/05/2019 1257   CHOLHDL 2.6 10/28/2009 0455   VLDL 11 10/28/2009 0455   LDLCALC 50 12/05/2019 1257   LDLDIRECT 121.6 10/13/2006 1729   Home Medications   Current Meds  Medication Sig   Ascorbic Acid (VITAMIN C PO) Take 1 tablet by mouth daily.   aspirin EC 81 MG tablet Take 81 mg by mouth daily.    atenolol  (TENORMIN) 25 MG tablet Take 1.5 tablets (37.5 mg total) by mouth daily.   cholecalciferol (VITAMIN D3) 25 MCG (1000 UNIT) tablet Take 1,000 Units by mouth daily.   hydrochlorothiazide (HYDRODIURIL) 25 MG tablet Take 25 mg by mouth daily.   irbesartan (AVAPRO) 300 MG tablet Take 0.5 tablets (150 mg total) by mouth daily.   Multiple Vitamin (MULTIVITAMIN WITH MINERALS) TABS tablet Take 1 tablet by mouth daily.   pantoprazole (PROTONIX) 20 MG tablet Take 1 tablet (20 mg total) by mouth daily.   rosuvastatin (CRESTOR) 10 MG tablet Take 1 tablet (10 mg total) by mouth daily at 6 PM.     Review of Systems      All other systems reviewed and are otherwise negative except as noted above.  Physical Exam    VS:  BP 124/76   Pulse 64   Ht '5\' 3"'$  (1.6 m)   Wt 187 lb 12.8 oz (85.2  kg)   BMI 33.27 kg/m  , BMI Body mass index is 33.27 kg/m.  Wt Readings from Last 3 Encounters:  09/11/21 187 lb 12.8 oz (85.2 kg)  07/10/21 187 lb 12.8 oz (85.2 kg)  06/14/21 186 lb (84.4 kg)    GEN: Well nourished, overweight, well developed, in no acute distress. HEENT: normal. Neck: Supple, no JVD, carotid bruits, or masses. Cardiac: RRR, no murmurs, rubs, or gallops. No clubbing, cyanosis, edema. Radials/PT 2+ and equal bilaterally.  Respiratory:  Respirations regular and unlabored, clear to auscultation bilaterally. GI: Soft, nontender, nondistended. MS: No deformity or atrophy. Skin: Warm and dry, no rash. Neuro:  Strength and sensation are intact. Psych: Normal affect.  Assessment & Plan    HTN  / Lightheadedness - Since reduced dose Irbesartan, lightheadedness has resolved. BP well controlled. Continue current antihypertensive regimen of Irbesartan '150mg'$  QD, Atenolol 1.5 mg QD, HCTZ '25mg'$  QD.   CAD - Stable with no anginal symptoms. No indication for ischemic evaluation.  GDMT includes Atenolol, aspirin, rosuvastatin. Heart healthy diet and regular cardiovascular exercise encouraged.    Overweight -  Weight loss via diet and exercise encouraged. Discussed the impact being overweight would have on cardiovascular risk.   HLD - 08/2021 LDL 53. LDL goal <70. Continue Rosuvastatin '10mg'$  QD.  Disposition: Follow up in 6 months with Skeet Latch, MD or APP.  Signed, Loel Dubonnet, NP 09/11/2021, [place]

## 2021-09-12 ENCOUNTER — Encounter (HOSPITAL_BASED_OUTPATIENT_CLINIC_OR_DEPARTMENT_OTHER): Payer: Self-pay

## 2021-10-09 DIAGNOSIS — L309 Dermatitis, unspecified: Secondary | ICD-10-CM | POA: Diagnosis not present

## 2022-01-08 ENCOUNTER — Other Ambulatory Visit (HOSPITAL_BASED_OUTPATIENT_CLINIC_OR_DEPARTMENT_OTHER): Payer: Self-pay | Admitting: Family

## 2022-01-08 DIAGNOSIS — I10 Essential (primary) hypertension: Secondary | ICD-10-CM

## 2022-01-08 NOTE — Telephone Encounter (Signed)
Rx(s) sent to pharmacy electronically.  

## 2022-01-15 ENCOUNTER — Other Ambulatory Visit (HOSPITAL_BASED_OUTPATIENT_CLINIC_OR_DEPARTMENT_OTHER): Payer: Self-pay | Admitting: Family

## 2022-01-15 NOTE — Telephone Encounter (Signed)
Rx(s) sent to pharmacy electronically.  

## 2022-02-21 DIAGNOSIS — M7989 Other specified soft tissue disorders: Secondary | ICD-10-CM | POA: Diagnosis not present

## 2022-02-24 DIAGNOSIS — E7800 Pure hypercholesterolemia, unspecified: Secondary | ICD-10-CM | POA: Diagnosis not present

## 2022-02-24 DIAGNOSIS — E559 Vitamin D deficiency, unspecified: Secondary | ICD-10-CM | POA: Diagnosis not present

## 2022-03-03 DIAGNOSIS — I2510 Atherosclerotic heart disease of native coronary artery without angina pectoris: Secondary | ICD-10-CM | POA: Diagnosis not present

## 2022-03-03 DIAGNOSIS — E559 Vitamin D deficiency, unspecified: Secondary | ICD-10-CM | POA: Diagnosis not present

## 2022-03-03 DIAGNOSIS — M79671 Pain in right foot: Secondary | ICD-10-CM | POA: Diagnosis not present

## 2022-03-03 DIAGNOSIS — E7800 Pure hypercholesterolemia, unspecified: Secondary | ICD-10-CM | POA: Diagnosis not present

## 2022-03-03 DIAGNOSIS — I10 Essential (primary) hypertension: Secondary | ICD-10-CM | POA: Diagnosis not present

## 2022-03-03 DIAGNOSIS — N1831 Chronic kidney disease, stage 3a: Secondary | ICD-10-CM | POA: Diagnosis not present

## 2022-03-17 ENCOUNTER — Encounter (HOSPITAL_BASED_OUTPATIENT_CLINIC_OR_DEPARTMENT_OTHER): Payer: Self-pay | Admitting: Family

## 2022-03-17 ENCOUNTER — Ambulatory Visit (HOSPITAL_BASED_OUTPATIENT_CLINIC_OR_DEPARTMENT_OTHER): Payer: Medicare Other | Admitting: Family

## 2022-03-17 ENCOUNTER — Ambulatory Visit (INDEPENDENT_AMBULATORY_CARE_PROVIDER_SITE_OTHER): Payer: Medicare Other | Admitting: Family

## 2022-03-17 VITALS — BP 120/64 | HR 69 | Ht 63.0 in | Wt 192.0 lb

## 2022-03-17 DIAGNOSIS — E785 Hyperlipidemia, unspecified: Secondary | ICD-10-CM | POA: Diagnosis not present

## 2022-03-17 DIAGNOSIS — Z6834 Body mass index (BMI) 34.0-34.9, adult: Secondary | ICD-10-CM | POA: Diagnosis not present

## 2022-03-17 DIAGNOSIS — K219 Gastro-esophageal reflux disease without esophagitis: Secondary | ICD-10-CM

## 2022-03-17 DIAGNOSIS — I25118 Atherosclerotic heart disease of native coronary artery with other forms of angina pectoris: Secondary | ICD-10-CM | POA: Diagnosis not present

## 2022-03-17 DIAGNOSIS — I10 Essential (primary) hypertension: Secondary | ICD-10-CM

## 2022-03-17 MED ORDER — PANTOPRAZOLE SODIUM 20 MG PO TBEC: 20.0000 mg | DELAYED_RELEASE_TABLET | Freq: Every day | ORAL | 3 refills | Status: DC

## 2022-03-17 NOTE — Patient Instructions (Addendum)
Medication Instructions:  Your physician has recommended you make the following change in your medication:   RESUME Pantoprazole (Protonix) '20mg'$  daily  *If you need a refill on your cardiac medications before your next appointment, please call your pharmacy*  Lab Work/Testing/Procedures: Your EKG today looked great!  Follow-Up: At [facility], you and your health needs are our priority.  As part of our continuing mission to provide you with exceptional heart care, we have created designated Provider Care Teams.  These Care Teams include your primary Cardiologist (physician) and Advanced Practice Providers (APPs -  Physician Assistants and Nurse Practitioners) who all work together to provide you with the care you need, when you need it.  We recommend signing up for the patient portal called "MyChart".  Sign up information is provided on this After Visit Summary.  MyChart is used to connect with patients for Virtual Visits (Telemedicine).  Patients are able to view lab/test results, encounter notes, upcoming appointments, etc.  Non-urgent messages can be sent to your provider as well.   To learn more about what you can do with MyChart, go to [email or web address].    Your next appointment:   As scheduled with Dr. Oval Linsey and Dr. Claiborne Billings  Other Instructions  Heart Healthy Diet Recommendations: A low-salt diet is recommended. Meats should be grilled, baked, or boiled. Avoid fried foods. Focus on lean protein sources like fish or chicken with vegetables and fruits. The American Heart Association is a Microbiologist!  American Heart Association Diet and Lifeystyle Recommendations   Exercise recommendations: The American Heart Association recommends 150 minutes of moderate intensity exercise weekly. Try 30 minutes of moderate intensity exercise 4-5 times per week. This could include walking, jogging, or swimming.  We have referred you to the PREP program at the [facility].  If you are not  able to do this again, we will instead refer you to Right Start at [facility].   To prevent or reduce lower extremity swelling: Eat a low salt diet. Salt makes the body hold onto extra fluid which causes swelling. Sit with legs elevated. For example, in the recliner or on an [place].  Wear knee-high compression stockings during the daytime. Ones labeled 15-20 mmHg provide good compression.

## 2022-03-17 NOTE — Progress Notes (Signed)
Office Visit    Patient Name: Christine Steele Date of Encounter: 03/17/2022  PCP:  [name], [place]  Cardiologist:  Skeet Latch, MD  Advanced Practice Provider:  No care team member to display Electrophysiologist:  None     Chief Complaint    Christine Steele is a 71 y.o. female with a hx of CAD, HTN, brain aneurysm, OSA on CPAP presents today for hypertension follow up  Past Medical History    Past Medical History:  Diagnosis Date   Aneurysm ([place])    70m Right MCA   Arthritis    Cataract    Bilateral   Chronic kidney disease    Dr. PShelia Media(PCP)    Hypertension    OSA on CPAP    havent used CPAP in long time per patient due to unable to get filters to change out   Osteopenia 01/2017   T score -1.4 FRAX 3.4% / 0.4%   Tachycardia    Vitamin D deficiency    Past Surgical History:  Procedure Laterality Date   BIOPSY BREAST  1970   Right Breast   COLONOSCOPY     DILATATION & CURETTAGE/HYSTEROSCOPY WITH MYOSURE N/A 03/06/2017   [place]  Surgeon: LPrincess Bruins MD;  [place]  Service: Gynecology;  Laterality: N/A;  Request 7:30am in [place] time  requests one hour   [place]   Procedure: DILATATION & CURETTAGE/HYSTEROSCOPY WITH MYOSURE;  Surgeon: LPrincess Bruins MD;  [place]  Service: Gynecology;  Laterality: N/A;  request 8:30am OR time in [place] block requests 45 minutes   right lumpectomy  1970   benign    Allergies  Allergies  Allergen Reactions   Amlodipine Swelling and Other (See Comments)    Leg/feet swelling.   Chocolate Other (See Comments)    Headache   Chocolate Flavor     Other reaction(s): headaches   Coconut Fatty Acids Other (See Comments)   Hydralazine Other (See Comments)    ? Fast heart rate   Hydralazine Hcl      Other reaction(s): heart rate increase   Other Other (See Comments)    MSG causes headaches   Peanut-Containing Drug Products Other (See Comments)    All peanuts; headache    History of Present Illness    LKIMMERLY LORAis a 71y.o. female with a hx of moderate coronary disease, brain aneurysm, hypertension, OSA on CPAP last seen 07/10/21  Seen in2019 for chest discomfort with myoview 03/2018 negative for ischemia. Due to continued atypical symptoms cardiac CTA performed 06/2019 with moderate LAD disease and calcium score 98th percentile.   Prior adjustments to antihypertensive regimen include discontinuation of HCTZ due to weakness, transition from Losartan to Irbesartan. Hydralazine added 08/2019 due to uncontrolled BP but did not tolerate and Atenolol instead increased. [facility]  In clinic 07/10/21 Irbesartan dose reduced due to SBP 110s associated with fatigue. Last seen 09/11/21 - she was doing well from cardiac perspective and recommended to follow up in one year.   She presents today for follow up independently. 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 [place]. Does note some difficulties with acid reflux but has not taken her Protonix for a couple months  per her report. BP has been well controlled at home. No exertional chest discomfort. Does note some exertional dyspnea with more than usual activity for a few months which she attributes to not being as active. Notes after her mom moved in with her she stopped exercising as much and feels her progress since PREP program completion in 2021 has regressed.  Labs via PCP  08/21/2021 hemoglobin 12.5, ALT 28, AST 20, creatinine 1.21, K4.0, GFR 53 Total cholesterol 132, HDL 69, LDL 53, triglycerides 49  EKGs/Labs/Other Studies Reviewed:   The following studies were reviewed today:  Cardiac CTA  06/2019 FINDINGS: Non-cardiac: See separate report from [facility].   Pulmonary veins drain normally to the left atrium.   Calcium Score: 968 Agatston units.   Coronary Arteries: Right dominant with high anterior take-off of the RCA above the right cusp.   LM: Calcified plaque distal left main, mild (<50%) stenosis.   LAD system: Extensive calcified plaque ostial/proximal LAD. There is significant blooming artifact, very difficult to comment on degree of stenosis. Could be moderate range (51-69%), does not appear to be critical.   Circumflex system: No significant plaque or stenosis.   RCA system: High anterior take-off above the right cusp. Calcified plaque proximal RCA with mild (<50%) stenosis.   IMPRESSION: 1. Coronary artery calcium score 968 Agatston units. This places the patient in the 98th percentile for age and gender, suggesting high risk for future cardiac events.   2.  Suspect mild proximal RCA stenosis.   3. Extensive calcified plaque in the ostial/proximal LAD. Blooming artifact makes degree of stenosis hard to comment upon. Possible moderate-range stenosis, doubt critical. Will send for FFR.  FFR 06/2019 TECHNIQUE: The coronary CTA was sent for FFR.   FINDINGS: Unable to interpret study due to extent of motion artifact.   Lexiscan Myoview  04/02/18: Nuclear stress EF: 52%. The left ventricular ejection fraction is mildly decreased (45-54%). T wave inversion was noted during stress in the V4, V5, aVF and V3 leads. This is a low risk study. No evidence of ischemia. No previous MI The study is normal.   EKG:  No EKG today.  Recent Labs: No results found for requested labs within last 365 days.  Recent Lipid Panel    Component Value Date/Time   CHOL 127 12/05/2019 1257   TRIG 42 12/05/2019 1257   HDL 67 12/05/2019 1257   CHOLHDL 1.9 12/05/2019 1257   CHOLHDL 2.6 10/28/2009 0455   VLDL 11 10/28/2009 0455   LDLCALC 50 12/05/2019 1257    LDLDIRECT 121.6 10/13/2006 1729   Home Medications   Current Meds  Medication Sig   Ascorbic Acid (VITAMIN C PO) Take 1 tablet by mouth daily.   aspirin EC 81 MG tablet Take 81 mg by mouth daily.    atenolol (TENORMIN) 25 MG tablet TAKE 1.5 TABLETS BY MOUTH DAILY.   cholecalciferol (VITAMIN D3) 25 MCG (1000 UNIT) tablet Take 1,000 Units by mouth daily.   hydrochlorothiazide (HYDRODIURIL) 25 MG tablet Take 0.5 tablets by mouth every morning.   irbesartan (AVAPRO) 300 MG tablet Take 150 mg by mouth daily.   Multiple Vitamin (MULTIVITAMIN WITH MINERALS) TABS tablet Take 1 tablet by mouth daily.   rosuvastatin (CRESTOR) 10 MG tablet TAKE 1 TABLET BY MOUTH DAILY AT 6 PM.   [DISCONTINUED] hydrochlorothiazide (HYDRODIURIL) 25 MG tablet Take 25 mg by mouth daily.   [DISCONTINUED] pantoprazole (PROTONIX) 20 MG tablet Take 1 tablet (20 mg total) by mouth daily.  Review of Systems      All other systems reviewed and are otherwise negative except as noted above.  Physical Exam    VS:  BP 120/64   Pulse 69   Ht '5\' 3"'$  (1.6 m)   Wt 192 lb (87.1 kg)   BMI 34.01 kg/m  , BMI Body mass index is 34.01 kg/m.  Wt Readings from Last 3 Encounters:  03/17/22 192 lb (87.1 kg)  09/11/21 187 lb 12.8 oz (85.2 kg)  07/10/21 187 lb 12.8 oz (85.2 kg)    GEN: Well nourished, overweight, well developed, in no acute distress. HEENT: normal. Neck: Supple, no JVD, carotid bruits, or masses. Cardiac: RRR, no murmurs, rubs, or gallops. No clubbing, cyanosis, edema. Radials/PT 2+ and equal bilaterally.  Respiratory:  Respirations regular and unlabored, clear to auscultation bilaterally. GI: Soft, nontender, nondistended. MS: No deformity or atrophy. Skin: Warm and dry, no rash. Neuro:  Strength and sensation are intact. Psych: Normal affect.  Assessment & Plan    HTN  -Continue current antihypertensive regimen of Irbesartan '150mg'$  QD, Atenolol 37.5 mg QD, HCTZ 12.'5mg'$  QD.   CAD - Stable with no anginal  symptoms. No indication for ischemic evaluation.  GDMT includes Atenolol, aspirin, rosuvastatin. Heart healthy diet and regular cardiovascular exercise encouraged.    Overweight - Weight loss via diet and exercise encouraged. Discussed the impact being overweight would have on cardiovascular risk. Referred to PREP program at [facility]. Has previously completed PREP >2 years ago but feels she has regressed and interested in participating again. If not able, will refer to [facility] Start at [facility] instead.   OSA - Has not been on CPAP for a few years. Notes snoring and daytime somnolence. Interested in being treated again. Previously had difficulty with CPAP with nose burning. Previously followed with Dr. Claiborne Billings 2021. She still has CPAP machine but no supplies.  Appt scheduled with Dr. Claiborne Billings and will reach out to him and sleep team to determine if she needs repeat sleep study or just new supplies. Has not used CPAP in >1 year.   HLD - 08/2021 LDL 53. LDL goal <70. Continue Rosuvastatin '10mg'$  QD.  Disposition: Follow up in 6 months with Skeet Latch, MD or APP.  Signed, Loel Dubonnet, NP 03/17/2022, [place]

## 2022-03-18 ENCOUNTER — Ambulatory Visit (INDEPENDENT_AMBULATORY_CARE_PROVIDER_SITE_OTHER): Payer: Medicare Other | Admitting: Podiatry

## 2022-03-18 ENCOUNTER — Encounter: Payer: Self-pay | Admitting: Podiatry

## 2022-03-18 ENCOUNTER — Other Ambulatory Visit: Payer: Self-pay | Admitting: Podiatry

## 2022-03-18 DIAGNOSIS — M10471 Other secondary gout, right ankle and foot: Secondary | ICD-10-CM | POA: Diagnosis not present

## 2022-03-18 DIAGNOSIS — M7671 Peroneal tendinitis, right leg: Secondary | ICD-10-CM | POA: Diagnosis not present

## 2022-03-18 MED ORDER — METHYLPREDNISOLONE 4 MG PO TBPK: ORAL_TABLET | ORAL | 0 refills | Status: DC

## 2022-03-18 NOTE — Patient Instructions (Signed)

## 2022-03-19 LAB — URIC ACID: Uric Acid: 7.5 mg/dL — ABNORMAL HIGH (ref 3.0–7.2)

## 2022-03-19 NOTE — Progress Notes (Signed)
  Subjective:  Patient ID: Stephania Fragmin, female    DOB: 04-Nov-1950,  MRN: [id number]  Chief Complaint  Patient presents with   Foot Pain    (np) to manage persistant right foot pain - she has a history of gout and plantar fasciitis - had xrays a week ago with [facility]- she has had this pain in the lateral side of her foot for about 3 weeks    71 y.o. female presents with the above complaint. History confirmed with patient.  This started in the side of the foot and radiates up the leg.  She also has had ankle swelling and pain.  She already had x-rays and this did not show any fractures or dislocation  Objective:  Physical Exam: warm, good capillary refill, no trophic changes or ulcerative lesions, normal DP and PT pulses, and normal sensory exam. Left Foot: normal exam, no swelling, tenderness, instability; ligaments intact, full range of motion of all ankle/foot joints Right Foot:  Tenderness at the fifth metatarsal insertion of the peroneus brevis with resisted eversion and slightly in the retromalleolar groove, some edema and tenderness in the sinus tarsi and anterior ankle  Assessment:   1. Other secondary acute gout of right ankle   2. Peroneal tendinitis, right      Plan:  Patient was evaluated and treated and all questions answered.  I recommended evaluating for the possibility of a gout flare and a uric acid level was ordered.  Possible that she improved temporarily with the prednisone taper she had previously and she needs further anti-inflammatory so I prescribed her further methylprednisolone taper for 1 week.  I do think component of this is peroneus brevis insertional tendinitis as well.  I think the methylprednisolone should help with this.  I recommend immobilization and support in a Tri-Lock ankle brace and home physical therapy and a home exercise plan was given to her.  Return in about 2 months (around 05/18/2022) for follow up peroneal tendinitis,  gout .

## 2022-03-21 ENCOUNTER — Telehealth: Payer: Self-pay

## 2022-03-21 NOTE — Telephone Encounter (Signed)
Call placed to pt reference new PREP referral. 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 Would like to repeat to begin to refocus on her health. She now has help to assist with care. Can do [facility] as a location. Anticipating start of class to be 12/12.  Coach will contact.

## 2022-04-01 NOTE — Progress Notes (Signed)
[facility] PREP Evaluation  Patient Details  Name: Christine Steele MRN: [id number] Date of Birth: 25-May-1950 Age: 71 y.o. PCP: [name], MD  Vitals:   04/01/22 1201  BP: 132/60  Pulse: 70  SpO2: 98%  Weight: 191 lb 12.8 oz (87 kg)     [facility] Eval - 04/01/22 1200       [facility] "PREP" Location   [facility] "PREP" Location [facility]      Referral    Referring Provider --   C. 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 3    I have no trouble taking out the trash. 4    I do housework such as vacuuming and dusting on my own without difficulty. 2    I can easily lift a gallon of milk (8lbs). 4    I can easily walk a mile. 3    I have no trouble reaching into high cupboards or reaching down to pick up something from the floor. 3    I do not have trouble doing out-door work such as Armed forces logistics/support/administrative officer, raking leaves, or gardening. 2      Mobility and Daily Activities   I feel younger than my age. 2    I feel independent. 2    I feel energetic. 2    I live an active life.  2    I feel strong. 2    I feel healthy. 2    I feel active as other people my age. 2      How fit and strong are you.   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 PShelia Media(PCP)    Hypertension    OSA on CPAP    havent used CPAP in long time per patient due to unable to get filters to change out   Osteopenia 01/2017   T score -1.4 FRAX 3.4% / 0.4%   Tachycardia    Vitamin D deficiency    Past Surgical History:  Procedure Laterality Date   BIOPSY BREAST  1970   Right Breast   COLONOSCOPY     DILATATION & CURETTAGE/HYSTEROSCOPY WITH MYOSURE N/A 03/06/2017   Procedure: [facility]  Surgeon: LPrincess Bruins MD;  [place]  Service: Gynecology;  Laterality: N/A;  [place] [facility] [place]  requests one hour   DILATATION &  CURETTAGE/HYSTEROSCOPY WITH MYOSURE N/A 10/03/2019   Procedure: DILATATION & CURETTAGE/HYSTEROSCOPY WITH MYOSURE;  Surgeon: [name], MD;  Location: [place];  Service: Gynecology;  Laterality: N/A;  request 8:30am OR time in [place] [facility] block requests 45 minutes   right lumpectomy  1970   benign   Social History   Tobacco Use  Smoking Status Former   Packs/day: 1.00   Years: 5.00   Total pack years: 5.00   Types: Cigarettes   Quit date: 04/22/1979   Years since quitting: 42.9  Smokeless Tobacco Never  To begin PREP today at [employer], every T/Th 10-11:15  Christine Steele 04/01/2022, 12:05 PM

## 2022-04-08 NOTE — Progress Notes (Signed)
[facility] PRE[facility] Weekly Session  Patient Details  Name: Christine Steele MRN: [id number] Date of Birth: 05/17/1950 Age: 71 y.o. PCP: [name], MD  Vitals:   04/08/22 1349  Weight: 190 lb (86.2 kg)     [facility] Weekly seesion - 04/08/22 1300       [facility] "PREP" Location   [facility]PREP[facility] Location [facility]      Weekly Session   Topic Discussed Importance of resistance training;Other ways to be active   Goal to work up to 150 min/cardio a week; strength training 2-3 times a week for 20-40 minutes; sitting no longer than 30 minutes   Minutes exercised this week 140 minutes    Classes attended to date [place] 04/08/2022, 1:50 PM

## 2022-04-15 NOTE — Progress Notes (Signed)
[facility] PREP Weekly Session  Patient Details  Name: Christine Steele MRN: [id number] Date of Birth: 08-23-1950 Age: 71 y.o. PCP: [name], MD  Vitals:   04/15/22 1135  Weight: 192 lb (87.1 kg)     [facility] Weekly seesion - 04/15/22 1100       [facility] "PREP" Location   [facility] "PREP" Location [facility]      Weekly Session   Topic Discussed Healthy eating tips   YUKA app, foods to avoid, foods/nutrients to increase            Halina Asano B Tylia Ewell 04/15/2022, 11:36 AM

## 2022-04-22 NOTE — Progress Notes (Signed)
[facility] PRE[facility] Weekly Session  Patient Details  Name: Christine WILLIAMSEN MRN: [id number] Date of Birth: [birth year] Age: 72 y.o. PCP: [name], MD  Vitals:   04/22/22 1508  Weight: 188 lb 8 oz (85.5 kg)     [facility] Weekly seesion - 04/22/22 1500       [facility] "PREP" Location   [facility] "PREP[facility] Location [place]      Weekly Session   Topic Discussed Health habits   Sugar demo, importance of water: goal is 1/2 body in oz/daily   Minutes exercised this week 255 minutes    Classes attended to date [place] 04/22/2022, 3:09 PM

## 2022-04-29 NOTE — Progress Notes (Signed)
[facility] PREP Weekly Session  Patient Details  Name: PAYLIN HAILU MRN: [id number] Date of Birth: 1950-08-17 Age: 72 y.o. PCP: [name], MD  Vitals:   04/29/22 1325  Weight: 187 lb 8 oz (85 kg)     [facility] Weekly seesion - 04/29/22 1300       [facility] "PREP" Location   [facility]PREP[facility] Location [facility]      Weekly Session   Topic Discussed Restaurant Eating   Salt demo; label review---importance of serviing size and ingredients listed.   Minutes exercised this week 240 minutes    Classes attended to date [place] 04/29/2022, 1:26 PM

## 2022-05-05 ENCOUNTER — Encounter (HOSPITAL_BASED_OUTPATIENT_CLINIC_OR_DEPARTMENT_OTHER): Payer: Self-pay

## 2022-05-06 NOTE — Progress Notes (Signed)
[facility] PREP Weekly Session  Patient Details  Name: ARIYANNAH PAULING MRN: [id number] Date of Birth: [birth year] Age: 72 y.o. PCP: [name], MD  Vitals:   05/06/22 1127  Weight: 187 lb (84.8 kg)     [facility] Weekly seesion - 05/06/22 1100       [facility] "PREP" Location   [facility] "PREP[facility] Location [facility]      Weekly Session   Topic Discussed Stress management and problem solving   importance of sleep; finger tip mudra, shared electronic sleep guidelines and information on Melt Method for hands/feet   Minutes exercised this week 220 minutes    Classes attended to date [place] 05/06/2022, 11:27 AM

## 2022-05-13 NOTE — Progress Notes (Signed)
[facility] PREP Weekly Session  Patient Details  Name: Christine Steele MRN: [id number] Date of Birth: 07-Feb-1951 Age: 72 y.o. PCP: [name], MD  Vitals:   05/13/22 1117  Weight: 186 lb (84.4 kg)     [facility] Weekly seesion - 05/13/22 1100       [facility] "PREP" Location   [facility] "PREP" Location [facility]      Weekly Session   Topic Discussed Expectations and non-scale victories   Staying positive; halfway thru program, review/revisit/restate goals   Minutes exercised this week 195 minutes    Classes attended to date [place] 05/13/2022, 11:18 AM

## 2022-05-20 ENCOUNTER — Ambulatory Visit (INDEPENDENT_AMBULATORY_CARE_PROVIDER_SITE_OTHER): Payer: Medicare Other | Admitting: Podiatry

## 2022-05-20 DIAGNOSIS — M10471 Other secondary gout, right ankle and foot: Secondary | ICD-10-CM

## 2022-05-20 DIAGNOSIS — M7671 Peroneal tendinitis, right leg: Secondary | ICD-10-CM | POA: Diagnosis not present

## 2022-05-20 NOTE — Progress Notes (Signed)
[facility] PREP Weekly Session  Patient Details  Name: Christine Steele MRN: [id number] Date of Birth: 10/17/1950 Age: 72 y.o. PCP: [name], MD  Vitals:   05/20/22 1122  Weight: 183 lb 8 oz (83.2 kg)     [facility] Weekly seesion - 05/20/22 1100       [facility] "PREP" Location   [facility] "PREP" Location [facility]      Weekly Session   Topic Discussed Other   Portion conrol; visualize your portion size demo; confusing food label review; to bring food labels to class next week.   Minutes exercised this week 205 minutes    Classes attended to date [place] 05/20/2022, 11:23 AM

## 2022-05-22 NOTE — Progress Notes (Signed)
  Subjective:  Patient ID: Christine Steele, female    DOB: 05-20-1950,  MRN: [id number]  Chief Complaint  Patient presents with   Tendonitis    follow up peroneal tendinitis, gout .    72 y.o. female presents with the above complaint. History confirmed with patient.  She is doing much better not having any pain  Objective:  Physical Exam: warm, good capillary refill, no trophic changes or ulcerative lesions, normal DP and PT pulses, and normal sensory exam. Left Foot: normal exam, no swelling, tenderness, instability; ligaments intact, full range of motion of all ankle/foot joints Right Foot: No pain to palpation today, minimal edema good strength in eversion Assessment:   1. Other secondary acute gout of right ankle   2. Peroneal tendinitis, right      Plan:  Patient was evaluated and treated and all questions answered.  Overall doing much better today and has improved quite a bit.  We discussed the possibility of recurrence of a gout flare in chronic gout management.  We also discussed continue to use the ankle brace and home therapy plan as needed.  She will return to see me as needed  Return if symptoms worsen or fail to improve.

## 2022-05-27 NOTE — Progress Notes (Signed)
[facility] PREP Weekly Session  Patient Details  Name: Christine Steele MRN: [id number] Date of Birth: 1950/04/25 Age: 72 y.o. PCP: [name], MD  Vitals:   05/27/22 1129  Weight: 183 lb (83 kg)     [facility] Weekly seesion - 05/27/22 1100       [facility] "PREP" Location   [facility] "PREP" Location [facility]      Weekly Session   Topic Discussed Finding support   review of labels from foods brought in by PREP participants   Minutes exercised this week 275 minutes    Classes attended to date [place] 05/27/2022, 11:30 AM

## 2022-06-03 ENCOUNTER — Ambulatory Visit: Payer: Medicare Other | Attending: Cardiovascular Disease | Admitting: Cardiovascular Disease

## 2022-06-03 ENCOUNTER — Encounter: Payer: Self-pay | Admitting: Cardiovascular Disease

## 2022-06-03 VITALS — BP 124/74 | HR 65 | Ht 63.0 in | Wt 183.0 lb

## 2022-06-03 DIAGNOSIS — G4733 Obstructive sleep apnea (adult) (pediatric): Secondary | ICD-10-CM | POA: Insufficient documentation

## 2022-06-03 DIAGNOSIS — I10 Essential (primary) hypertension: Secondary | ICD-10-CM | POA: Diagnosis not present

## 2022-06-03 DIAGNOSIS — E785 Hyperlipidemia, unspecified: Secondary | ICD-10-CM | POA: Insufficient documentation

## 2022-06-03 DIAGNOSIS — I2510 Atherosclerotic heart disease of native coronary artery without angina pectoris: Secondary | ICD-10-CM | POA: Diagnosis not present

## 2022-06-03 DIAGNOSIS — E669 Obesity, unspecified: Secondary | ICD-10-CM | POA: Diagnosis not present

## 2022-06-03 NOTE — Patient Instructions (Signed)
Medication Instructions:  NO CHANGES  *If you need a refill on your cardiac medications before your next appointment, please call your pharmacy*   Follow-Up: At [facility], you and your health needs are our priority.  As part of our continuing mission to provide you with exceptional heart care, we have created designated Provider Care Teams.  These Care Teams include your primary Cardiologist (physician) and Advanced Practice Providers (APPs -  Physician Assistants and Nurse Practitioners) who all work together to provide you with the care you need, when you need it.  We recommend signing up for the patient portal called "MyChart".  Sign up information is provided on this After Visit Summary.  MyChart is used to connect with patients for Virtual Visits (Telemedicine).  Patients are able to view lab/test results, encounter notes, upcoming appointments, etc.  Non-urgent messages can be sent to your provider as well.   To learn more about what you can do with MyChart, go to [email or web address].    Your next appointment:    6-8 weeks with Dr. Claiborne Billings -- sleep

## 2022-06-03 NOTE — Progress Notes (Signed)
Cardiology Office Note    Date:  06/09/2022   ID:  Christine, Steele [birth year], MRN [id number]  PCP:  [name], MD  Cardiologist:  Shelva Majestic, MD (sleep); Dr. Oval Linsey   32 month follow-up sleep evaluation  History of Present Illness:  Christine Steele is a 72 y.o. female who is followed by Dr. Skeet Latch for her cardiology care.  She has a history of hypertension, as well as a brain aneurysm.  Approximately 10 years ago she had a sleep study in [place].  She initiated CPAP therapy but only used it for a short duration.  She never saw a sleep doctor in follow-up.  The company ultimately dissolved and she ultimately stopped using CPAP.   She has seen Dr. Oval Linsey and because of complaints of loud snoring, nonrestorative sleep, frequent awakenings she was referred for a sleep study.   A diagnostic polysomnogram was done on May 17, 2018 which confirmed significant sleep apnea.  Overall sleep apnea was moderate with an AHI of 22.4/h.  However during REM sleep, sleep apnea was severe with an AHI of 49.8/h.  She had significant oxygen desaturation to a nadir of 78%.  There was moderate snoring. On March 1,2020 she underwent a CPAP titration and CPAP was titrated up to 10 cm water pressure.  Oxygen nadir was 95% and AHI during that study at 10 cm was 0.   She initiated CPAP therapy in late April 2020.   I saw her for initial telemedicine evaluation during Covid pandemic on September 23, 2018.  She was sleeping significantly better since initiating CPAP therapy.   Her DME company is [facility].  A download was obtained from Aug 24, 2018 through September 22, 2018.  She is meeting compliance standards and usage days was 90%.  3 days that she did not use therapy was because she had cleaned her tubing and it was still significantly wet and therefore she did not use it those nights.  At 10 cm water pressure, AHI is improved but still elevated at 10.9/h.  She has been using nasal pillows.     During that evaluation, I made some changes and switched her to an auto mode initiating a 10 cm up to 20 cm and increased to restart up pressure.  Over the past year, she has seen Dr. Oval Linsey for cardiovascular care and her atenolol dose was reduced to 37.5 mg it was recommended she continue irbesartan at her 300 mg dose.  She has been on rosuvastatin for hyperlipidemia.  I last evaluated her on October 13, 2019.  I obtained a new download in the office from May 25 through October 12, 2019.  She is meeting compliance standards however usage was only 73 days.  Average usage was just under 8 hours at 7 hours and 53 minutes.  The days she did not use it was when she was sick but otherwise her use has been 100%.  She continues to sleep better with CPAP.  AHI was 6.9 and her 95th percentile pressure was 13.5.  Maximum average pressure was 14.9.  She did have central apnea of 4.1.  She has been using nasal pillow mask.  She is unaware of breakthrough snoring.  An Epworth Sleepiness Scale score was calculated today which endorsed at 0 argue against any daytime sleepiness.    Since I last saw her, she has stopped using CPAP therapy over the prior year.  Previously she had nasal pillows  but this resulted in nose burning symptoms.  She has been caring for her mother and due to being busy never reinstituted treatment.  Presently she admits to snoring, frequent awakenings, and has been having nocturia at least 2-4 times per night.  She still has her machine and would like to restart therapy.  She presents for a almost 3-year evaluation.  Past Medical History:  Diagnosis Date   Aneurysm ([place])    21m Right MCA   Arthritis    Cataract    Bilateral   Chronic kidney disease    Dr. PShelia Media(PCP)    Hypertension    OSA on CPAP    havent used CPAP in long time per patient due to unable to get filters to change out   Osteopenia 01/2017   T score -1.4 FRAX 3.4% / 0.4%   Tachycardia    Vitamin D deficiency     Past  Surgical History:  Procedure Laterality Date   BIOPSY BREAST  1970   Right Breast   COLONOSCOPY     DILATATION & CURETTAGE/HYSTEROSCOPY WITH MYOSURE N/A 03/06/2017   [place]  Surgeon: LPrincess Bruins MD;  [facility] [facility]  Service: Gynecology;  Laterality: N/A;  Request 7[place]am [place]  [place] one hour   [place] 10/03/2019   Procedure: DILATATION & CURETTAGE/HYSTEROSCOPY WITH MYOSURE;  Surgeon: LPrincess Bruins MD;  [facility]: [facility]  Service: Gynecology;  Laterality: N/A;  request 8:30am OR time in [place] requests 45 minutes   right lumpectomy  1970   benign    Current Medications: Outpatient Medications Prior to Visit  Medication Sig Dispense Refill   Ascorbic Acid (VITAMIN C PO) Take 1 tablet by mouth daily.     aspirin EC 81 MG tablet Take 81 mg by mouth daily.      atenolol (TENORMIN) 25 MG tablet TAKE 1.5 TABLETS BY MOUTH DAILY. 135 tablet 1   hydrochlorothiazide (HYDRODIURIL) 25 MG tablet Take 0.5 tablets by mouth every morning.     irbesartan (AVAPRO) 300 MG tablet Take 150 mg by mouth daily.     Multiple Vitamin (MULTIVITAMIN WITH MINERALS) TABS tablet Take 1 tablet by mouth daily.     rosuvastatin (CRESTOR) 10 MG tablet TAKE 1 TABLET BY MOUTH DAILY AT 6 PM. 90 tablet 1   cholecalciferol (VITAMIN D3) 25 MCG (1000 UNIT) tablet Take 1,000 Units by mouth daily.     clobetasol cream (TEMOVATE) 0AB-123456789% 1 application Externally Twice a day for 10 day(s)     methylPREDNISolone (MEDROL DOSEPAK) 4 MG TBPK tablet 6 day dose pack - take as directed 21 tablet 0   pantoprazole (PROTONIX) 20 MG tablet Take 1 tablet (20 mg total) by mouth daily. 90 tablet 3   No facility-administered medications prior to visit.     Allergies:   Amlodipine, Chocolate, Chocolate flavor, Coconut fatty acids, Hydralazine,  Hydralazine hcl, Other, and Peanut-containing drug products   Social History   Socioeconomic History   Marital status: Married    Spouse name: MTrilby Steele  Number of children: 0   Years of education: 12+   Highest education level: Not on file  Occupational History   Not on file  Tobacco Use   Smoking status: Former    Packs/day: 1.00    Years: 5.00    Total pack years: 5.00    Types: Cigarettes    Quit date: 04/22/1979  Years since quitting: 43.1   Smokeless tobacco: Never  Vaping Use   Vaping Use: Never used  Substance and Sexual Activity   Alcohol use: No    Alcohol/week: 0.0 standard drinks of alcohol   Drug use: No   Sexual activity: Yes    Partners: Male    Birth control/protection: Post-menopausal    Comment: 1ST Intercourse- 10, partners-  55  Other Topics Concern   Not on file  Social History Narrative   Lives with husband   Caffeine use: none   Right handed   Social Determinants of Health   Financial Resource Strain: Not on file  Food Insecurity: Not on file  Transportation Needs: Not on file  Physical Activity: Not on file  Stress: Not on file  Social Connections: Not on file     Family History:  The patient's family history includes Heart attack in her father; Hypertension in her mother; Stroke in her father.   ROS General: Negative; No fevers, chills, or night sweats;  HEENT: Negative; No changes in vision or hearing, sinus congestion, difficulty swallowing Pulmonary: Negative; No cough, wheezing, shortness of breath, hemoptysis Cardiovascular: Hypertension, no chest pain GI: Negative; No nausea, vomiting, diarrhea, or abdominal pain GU: Negative; No dysuria, hematuria, or difficulty voiding Musculoskeletal: Negative; no myalgias, joint pain, or weakness Hematologic/Oncology: Negative; no easy bruising, bleeding Endocrine: Negative; no heat/cold intolerance; no diabetes Neuro: Negative; no changes in balance, headaches Skin: Negative; No rashes or  skin lesions Psychiatric: Negative; No behavioral problems, depression Sleep: Positive for OSA was on CPAP therapy but discontinued 1 to 2 years ago.  She has symptoms of snoring, fatigability, daytime sleepiness, frequent nocturia.   No bruxism, restless legs, hypnogognic hallucinations, no cataplexy  An Epworth Sleepiness Scale score was calculated in the office today and this endorsed at 9.  Other comprehensive 14 point system review is negative.   PHYSICAL EXAM:   VS:  BP 124/74   Pulse 65   Ht 5' 3"$  (1.6 m)   Wt 183 lb (83 kg)   SpO2 99%   BMI 32.42 kg/m     Repeat blood pressure by me was 124/70   Wt Readings from Last 3 Encounters:  06/03/22 181 lb 8 oz (82.3 kg)  06/03/22 183 lb (83 kg)  05/27/22 183 lb (83 kg)    General: Alert, oriented, no distress.  Skin: normal turgor, no rashes, warm and dry HEENT: Normocephalic, atraumatic. Pupils equal round and reactive to light; sclera anicteric; extraocular muscles intact;  Nose without nasal septal hypertrophy Mouth/Parynx benign; Mallinpatti scale 3 Neck: No JVD, no carotid bruits; normal carotid upstroke Lungs: clear to ausculatation and percussion; no wheezing or rales Chest wall: without tenderness to palpitation Heart: PMI not displaced, RRR, s1 s2 normal, 1/6 systolic murmur, no diastolic murmur, no rubs, gallops, thrills, or heaves Abdomen: soft, nontender; no hepatosplenomehaly, BS+; abdominal aorta nontender and not dilated by palpation. Back: no CVA tenderness Pulses 2+ Musculoskeletal: full range of motion, normal strength, no joint deformities Extremities: no clubbing cyanosis or edema, Homan's sign negative  Neurologic: grossly nonfocal; Cranial nerves grossly wnl Psychologic: Normal mood and affect    Studies/Labs Reviewed:   June 03, 2022 ECG (independently read by me): NSR at 65, mild sinus arrythmia  I personally reviewed the ECG from May 04, 2019 which showed sinus bradycardia 57.  Normal  intervals.  No ectopy.  Recent Labs:    Latest Ref Rng & Units 05/09/2020    9:19 PM 12/05/2019  12:57 PM 10/13/2019    9:45 AM  BMP  Glucose 70 - 99 mg/dL 88  78  76   BUN 8 - 23 mg/dL 17  15  18   $ Creatinine 0.44 - 1.00 mg/dL 1.14  1.07  1.12   BUN/Creat Ratio 12 - 28  14  16   $ Sodium 135 - 145 mmol/L 140  139  142   Potassium 3.5 - 5.1 mmol/L 3.9  4.5  4.2   Chloride 98 - 111 mmol/L 105  105  107   CO2 22 - 32 mmol/L 26  22  22   $ Calcium 8.9 - 10.3 mg/dL 9.0  9.0  9.3         Latest Ref Rng & Units 05/09/2020    9:19 PM 12/05/2019   12:57 PM 10/13/2019    9:45 AM  Hepatic Function  Total Protein 6.5 - 8.1 g/dL 6.6  6.4  6.5   Albumin 3.5 - 5.0 g/dL 3.9  4.1  4.2   AST 15 - 41 U/L 26  20  20   $ ALT 0 - 44 U/L 28  27  35   Alk Phosphatase 38 - 126 U/L 97  124  123   Total Bilirubin 0.3 - 1.2 mg/dL 0.5  0.6  0.3        Latest Ref Rng & Units 05/09/2020    9:19 PM 09/29/2019    1:44 PM 03/04/2017    9:08 AM  CBC  WBC 4.0 - 10.5 K/uL 4.5  5.7  4.7   Hemoglobin 12.0 - 15.0 g/dL 13.0  11.8  12.7   Hematocrit 36.0 - 46.0 % 40.9  37.5  37.9   Platelets 150 - 400 K/uL 205  295  249    Lab Results  Component Value Date   MCV 94.7 05/09/2020   MCV 93.3 09/29/2019   MCV 90.2 03/04/2017   Lab Results  Component Value Date   TSH 1.480 11/28/2015   Lab Results  Component Value Date   HGBA1C 5.8 (H) 11/28/2015     BNP No results found for: "BNP"  ProBNP No results found for: "PROBNP"   Lipid Panel     Component Value Date/Time   CHOL 127 12/05/2019 1257   TRIG 42 12/05/2019 1257   HDL 67 12/05/2019 1257   CHOLHDL 1.9 12/05/2019 1257   CHOLHDL 2.6 10/28/2009 0455   VLDL 11 10/28/2009 0455   LDLCALC 50 12/05/2019 1257   LDLDIRECT 121.6 10/13/2006 1729   LABVLDL 10 12/05/2019 1257     RADIOLOGY: No results found.   Additional studies/ records that were reviewed today include:  A new download was obtained in the office from Sep 11, 2019 through October 10, 2019.  ASSESSMENT:    1. OSA (obstructive sleep apnea)   2. Essential hypertension   3. Hyperlipidemia LDL goal <70   4. CAD in native artery   5. Mild obesity     PLAN:  Ms. Katilynn Sweitzer is a 72 year old female who has a history of hypertension, moderate CAD, brain aneurysm, as well as obstructive sleep apnea who previously was on CPAP therapy but stopped treatment over 1-2 years ago.  On her initial evaluation on May 17, 2018, she was found to have moderate overall sleep apnea with an AHI of 22.4/h and RDI of 26.8/h.  Sleep apnea was severe during REM sleep with an AHI of 49.8/h and there was severe oxygen desaturation to 78%.  Following her CPAP titration  study on June 20, 2018 she initiated CPAP therapy at 10 cm of water and was using a nasal pillow AirFit P30 mask.  On subsequent evaluation, she was changed to an auto mode.  It appears that her last CPAP use was in May 2021.  She now is symptomatic and admits to snoring, frequent nocturia, nonrestorative sleep and has interest to reinitiating therapy.  She is on Medicare/[employer].  She will need to be switched to a new DME company to obtain supplies since [facility] no longer is doing CPAP.  She still has her machine and we will reinstitute therapy with a ramp start pressure at 6 and an auto ramp.  Her CPAP will be set at a pressure range of 11 to 20 cm of water based on her prior data.  We will need to show compliance with treatment and obtain a download verifying compliance over the next 30 days.  I will see her in 6 to 8 weeks for follow-up evaluation or sooner as needed.  Previously, I had recommended changing from nasal pillows to a ResMed AirFit N30i mask to reduce potential nasal irritation.  She continues to be followed by Dr. Skeet Latch for cardiology care.  She currently is on atenolol 37.5 mg daily, HCTZ 12.5 mg, and irbesartan 300 mg daily for hypertension.  She is on rosuvastatin 10 mg for hyperlipidemia.   I will see her in 6 to 8 weeks for follow-up evaluation or sooner as needed.     Medication Adjustments/Labs and Tests Ordered: Current medicines are reviewed at length with the patient today.  Concerns regarding medicines are outlined above.  Medication changes, Labs and Tests ordered today are listed in the Patient Instructions below. Patient Instructions  Medication Instructions:  NO CHANGES  *If you need a refill on your cardiac medications before your next appointment, please call your pharmacy*   Follow-Up: At [facility], you and your health needs are our priority.  As part of our continuing mission to provide you with exceptional heart care, we have created designated Provider Care Teams.  These Care Teams include your primary Cardiologist (physician) and Advanced Practice Providers (APPs -  Physician Assistants and Nurse Practitioners) who all work together to provide you with the care you need, when you need it.  We recommend signing up for the patient portal called "MyChart".  Sign up information is provided on this After Visit Summary.  MyChart is used to connect with patients for Virtual Visits (Telemedicine).  Patients are able to view lab/test results, encounter notes, upcoming appointments, etc.  Non-urgent messages can be sent to your provider as well.   To learn more about what you can do with MyChart, go to [email or web address].    Your next appointment:    6-8 weeks with Dr. Claiborne Billings -- sleep    Signed, Shelva Majestic, MD, [facility], [place], American Board of Sleep Medicine  06/09/2022 [place] [address] [place], [place], [place]  [address] Phone: [phone]

## 2022-06-03 NOTE — Progress Notes (Signed)
[facility] PREP Weekly Session  Patient Details  Name: Christine Steele MRN: [id number] Date of Birth: 1951/03/01 Age: 72 y.o. PCP: [name], MD  Vitals:   06/03/22 1136  Weight: 181 lb 8 oz (82.3 kg)     [facility] Weekly seesion - 06/03/22 1100       [facility] "PREP" Location   [facility] "PREP" Location [facility]      Weekly Session   Topic Discussed Calorie breakdown   Carbs (simple vs. complex), fats, proteins; quality of the calorie   Minutes exercised this week 215 minutes    Classes attended to date [place] 06/03/2022, 11:37 AM

## 2022-06-05 ENCOUNTER — Telehealth: Payer: Self-pay | Admitting: *Deleted

## 2022-06-05 NOTE — Telephone Encounter (Signed)
Patient came into the office and was given a heated hose for her CPAP machine. She was also given both a small and medium size ResMed Airtouch F20- mask to try so that she can restart her CPAP treatment. In order for her insurance to cover her supplies she will need to restart her treatment and become compliant. She will let me know if she has issues with this mask. If she does she will be given a ResMed F30i to try.

## 2022-06-09 ENCOUNTER — Encounter: Payer: Self-pay | Admitting: Cardiovascular Disease

## 2022-06-10 NOTE — Progress Notes (Signed)
[facility] PREP Weekly Session  Patient Details  Name: Christine Steele MRN: [id number] Date of Birth: [birth year] Age: 72 y.o. PCP: [name], MD  Vitals:   06/10/22 1132  Weight: 182 lb 8 oz (82.8 kg)     [facility] Weekly seesion - 06/10/22 1100       [facility] "PREP" Location   [facility] "PREP" Location [facility]      Weekly Session   Topic Discussed Hitting roadblocks   Review of goals and activity plan for next 90 days after program is over; asked to complete program survery and bring both to final assessment vist next Thursday   Minutes exercised this week 215 minutes    Classes attended to date [place] 06/10/2022, 11:33 AM

## 2022-06-17 NOTE — Progress Notes (Signed)
[facility] PREP Weekly Session  Patient Details  Name: AISLEEN BONET MRN: [id number] Date of Birth: 1950-09-20 Age: 72 y.o. PCP: [name], MD  Vitals:   06/17/22 1202  Weight: 181 lb (82.1 kg)     [facility] Weekly seesion - 06/17/22 1200       [facility] "PREP" Location   [facility] "PREP" Location [facility]      Weekly Session   Topic Discussed Other   How fit and strong survey completed; fit testing completed: final assessment visits scheduled for Thursday; review of goal and activity plan and PREP survey to bring on Thursday; able to practice yoga with fruit bowl meditation '@end'$  of class   Classes attended to date [place] 06/17/2022, 12:03 PM

## 2022-06-19 NOTE — Progress Notes (Signed)
[facility] PREP Evaluation  Patient Details  Name: Christine Steele MRN: [id number] Date of Birth: [birth year] Age: 72 y.o. PCP: [name], MD  Vitals:   06/19/22 0943  BP: (!) 112/52  Pulse: 61  SpO2: 99%  Weight: 181 lb (82.1 kg)     [facility] Eval - 06/19/22 0900       [facility] "PREP" Location   [facility] "PREP" Location [facility]      Referral    Referring Provider Walker    Program Start Date 04/01/22    Program End Date 06/19/22      Measurement   Waist Circumference 43.5 inches    Waist Circumference End Program 39 inches    Hip Circumference 47.5 inches    Hip Circumference End Program 45.5 inches    Body fat 44.5 percent      Mobility and Daily Activities   I find it easy to walk up or down two or more flights of stairs. 3    I have no trouble taking out the trash. 4    I do housework such as vacuuming and dusting on my own without difficulty. 4    I can easily lift a gallon of milk (8lbs). 4    I can easily walk a mile. 4    I have no trouble reaching into high cupboards or reaching down to pick up something from the floor. 4    I do not have trouble doing out-door work such as Armed forces logistics/support/administrative officer, raking leaves, or gardening. 2      Mobility and Daily Activities   I feel younger than my age. 3    I feel independent. 3    I feel energetic. 3    I live an active life.  4    I feel strong. 3    I feel healthy. 3    I feel active as other people my age. 3      How fit and strong are you.   Fit and Strong Total Score 47            Past Medical History:  Diagnosis Date   Aneurysm ([place])    64m Right MCA   Arthritis    Cataract    Bilateral   Chronic kidney disease    Dr. PShelia Media(PCP)    Hypertension    OSA on CPAP    havent used CPAP in long time per patient due to unable to get filters to change out   Osteopenia 01/2017   T score -1.4 FRAX 3.4% / 0.4%   Tachycardia    Vitamin D deficiency    Past Surgical History:  Procedure Laterality Date   BIOPSY  BREAST  1970   Right Breast   COLONOSCOPY     DILATATION & CURETTAGE/HYSTEROSCOPY WITH MYOSURE N/A 03/06/2017   [place]: [place]  Surgeon: LPrincess Bruins MD;  Location: [facility]  Service: Gynecology;  Laterality: N/A;  Request 7:30am in [place] time  requests one hour   [place] 10/03/2019   Procedure: DILATATION & CURETTAGE/HYSTEROSCOPY WITH MYOSURE;  Surgeon: LPrincess Bruins MD;  [place] [facility]  Service: Gynecology;  Laterality: N/A;  request 8:30am OR time in [facility] block requests 45 minutes   right lumpectomy  1970   benign   Social History   Tobacco Use  Smoking Status Former   Packs/day: 1.00   Years: 5.00  Total pack years: 5.00   Types: Cigarettes   Quit date: 04/22/1979   Years since quitting: 43.1  Smokeless Tobacco Never  Wt loss: 10.8 pounds Inches lost: 6.5 How fit and strong: 12/12/232: 35 06/19/22: 47 Educations sessions attended: 12 Workouts completed: [place] 06/19/2022, 9:45 AM

## 2022-07-09 ENCOUNTER — Other Ambulatory Visit (HOSPITAL_BASED_OUTPATIENT_CLINIC_OR_DEPARTMENT_OTHER): Payer: Self-pay | Admitting: Family

## 2022-07-09 DIAGNOSIS — I10 Essential (primary) hypertension: Secondary | ICD-10-CM

## 2022-07-09 NOTE — Telephone Encounter (Signed)
Rx request sent to pharmacy.  

## 2022-07-10 ENCOUNTER — Telehealth: Payer: Self-pay | Admitting: *Deleted

## 2022-07-10 NOTE — Telephone Encounter (Signed)
Patient called in to report that her mask seems to be blowing off of her face. She has a Airtouch F-20 Medium mask. During the conversation she stated to me when asked if she is washing her face before bed that she is, however she puts face cream on her face afterwards. I recommended to her to wash her face and her mask thoroughly. DO NOT put the face cream on. This can possibly be the cause of her mask not to seal properly. Patient voiced understanding. I told her to call back if this does not help. She may need a new cushion. There may be a build up of the face cream on the mask that cannot be removed with wiping it down.

## 2022-08-06 ENCOUNTER — Ambulatory Visit: Payer: Medicare Other | Attending: Cardiovascular Disease | Admitting: Cardiovascular Disease

## 2022-08-06 ENCOUNTER — Encounter: Payer: Self-pay | Admitting: Cardiovascular Disease

## 2022-08-06 VITALS — BP 124/68 | HR 53 | Ht 63.0 in | Wt 180.0 lb

## 2022-08-06 DIAGNOSIS — E669 Obesity, unspecified: Secondary | ICD-10-CM

## 2022-08-06 DIAGNOSIS — G4733 Obstructive sleep apnea (adult) (pediatric): Secondary | ICD-10-CM | POA: Diagnosis not present

## 2022-08-06 DIAGNOSIS — E785 Hyperlipidemia, unspecified: Secondary | ICD-10-CM | POA: Diagnosis not present

## 2022-08-06 DIAGNOSIS — I2510 Atherosclerotic heart disease of native coronary artery without angina pectoris: Secondary | ICD-10-CM

## 2022-08-06 DIAGNOSIS — I10 Essential (primary) hypertension: Secondary | ICD-10-CM | POA: Diagnosis not present

## 2022-08-06 NOTE — Patient Instructions (Signed)
Medication Instructions:  The current medical regimen is effective;  continue present plan and medications.  *If you need a refill on your cardiac medications before your next appointment, please call your pharmacy*   Follow-Up: At [facility], you and your health needs are our priority.  As part of our continuing mission to provide you with exceptional heart care, we have created designated Provider Care Teams.  These Care Teams include your primary Cardiologist (physician) and Advanced Practice Providers (APPs -  Physician Assistants and Nurse Practitioners) who all work together to provide you with the care you need, when you need it.  We recommend signing up for the patient portal called "MyChart".  Sign up information is provided on this After Visit Summary.  MyChart is used to connect with patients for Virtual Visits (Telemedicine).  Patients are able to view lab/test results, encounter notes, upcoming appointments, etc.  Non-urgent messages can be sent to your provider as well.   To learn more about what you can do with MyChart, go to [email or web address].    Your next appointment:   12 month(s)  Provider:   Thomas Kelly, MD (sleep)   

## 2022-08-06 NOTE — Progress Notes (Signed)
Cardiology Office Note    Date:  08/07/2022   ID:  Christine Steele, Christine Steele 1950/05/07, MRN [id number]  PCP:  Merri Brunette, MD  Cardiologist:  Nicki Guadalajara, MD (sleep); Dr. Duke Salvia  14 month follow-up sleep evaluation  History of Present Illness:  Christine Steele is a 72 y.o. female who is followed by Dr. Chilton Si for her cardiology care.  She has a history of hypertension, as well as a brain aneurysm.  Approximately 10 years ago she had a sleep study in [place].  She initiated CPAP therapy but only used it for a short duration.  She never saw a sleep doctor in follow-up.  The company ultimately dissolved and she ultimately stopped using CPAP.   She has seen Dr. Duke Salvia and because of complaints of loud snoring, nonrestorative sleep, frequent awakenings she was referred for a sleep study.   A diagnostic polysomnogram was done on May 17, 2018 which confirmed significant sleep apnea.  Overall sleep apnea was moderate with an AHI of 22.4/h.  However during REM sleep, sleep apnea was severe with an AHI of 49.8/h.  She had significant oxygen desaturation to a nadir of 78%.  There was moderate snoring. On March 1,2020 she underwent a CPAP titration and CPAP was titrated up to 10 cm water pressure.  Oxygen nadir was 95% and AHI during that study at 10 cm was 0.   She initiated CPAP therapy in late April 2020.   I saw her for initial telemedicine evaluation during Covid pandemic on September 23, 2018.  She was sleeping significantly better since initiating CPAP therapy.   Her DME company is [employer].  A download was obtained from Aug 24, 2018 through September 22, 2018.  She is meeting compliance standards and usage days was 90%.  3 days that she did not use therapy was because she had cleaned her tubing and it was still significantly wet and therefore she did not use it those nights.  At 10 cm water pressure, AHI is improved but still elevated at 10.9/h.  She has been using nasal pillows.     During that evaluation, I made some changes and switched her to an auto mode initiating a 10 cm up to 20 cm and increased to restart up pressure.  Over the past year, she has seen Dr. Duke Salvia for cardiovascular care and her atenolol dose was reduced to 37.5 mg it was recommended she continue irbesartan at her 300 mg dose.  She has been on rosuvastatin for hyperlipidemia.  I evaluated her on October 13, 2019.  I obtained a new download in the office from May 25 through October 12, 2019.  She is meeting compliance standards however usage was only 73 days.  Average usage was just under 8 hours at 7 hours and 53 minutes.  The days she did not use it was when she was sick but otherwise her use has been 100%.  She continues to sleep better with CPAP.  AHI was 6.9 and her 95th percentile pressure was 13.5.  Maximum average pressure was 14.9.  She did have central apnea of 4.1.  She has been using nasal pillow mask.  She is unaware of breakthrough snoring.  An Epworth Sleepiness Scale score was calculated today which endorsed at 0 argue against any daytime sleepiness.    I last saw her on June 03, 2022 after not having seen her in almost 3 years.  Since I last saw her,  she has stopped using CPAP therapy over the prior year.  Previously she had nasal pillows but this resulted in nose burning symptoms.  She has been caring for her mother and due to being busy never reinstituted treatment.  Presently she admits to snoring, frequent awakenings, and has been having nocturia at least 2-4 times per night.  She still has her machine and would like to restart therapy.  That evaluation, I set her CPAP at a pressure range of 11 to 20 cm of water based on prior data.  I discussed the importance of meeting compliance.  She continued to be on atenolol 37.5 mg, HCTZ 12.5 mg, and irbesartan 300 mg daily for hypertension.  She was on rosuvastatin 10 mg for hyperlipidemia.  Presently, Christine Steele has been using CPAP intermittently.   She typically goes to bed between midnight and 1 AM and wakes up at 6 AM.  Apparently she has been washing her face at night and then putting cream on.  He was then put her mask on off and there was significant mask leak as a result of the cream on her face.  A download was obtained from March 18 through August 05, 2022.  Compliance was suboptimal with only 47% of usage days.  Average use was 5 hours and 47 minutes.  At a pressure range of 11 to 20 cm, AHI was elevated at 13.1.  He admits to occasional ankle swelling.  She presents for evaluation.   Past Medical History:  Diagnosis Date   Aneurysm    2mm Right MCA   Arthritis    Cataract    Bilateral   Chronic kidney disease    Dr. Renne Crigler (PCP)    Hypertension    OSA on CPAP    havent used CPAP in long time per patient due to unable to get filters to change out   Osteopenia 01/2017   T score -1.4 FRAX 3.4% / 0.4%   Tachycardia    Vitamin D deficiency     Past Surgical History:  Procedure Laterality Date   BIOPSY BREAST  1970   Right Breast   COLONOSCOPY     DILATATION & CURETTAGE/HYSTEROSCOPY WITH MYOSURE N/A 03/06/2017   Procedure: DILATATION & CURETTAGE/HYSTEROSCOPY WITH MYOSURE;  Surgeon: Genia Del, MD;  Location: [facility];  Service: Gynecology;  Laterality: N/A;  Request 7:30am in [place] [facility] block time  requests one hour   DILATATION & CURETTAGE/HYSTEROSCOPY WITH MYOSURE N/A 10/03/2019   Procedure: DILATATION & CURETTAGE/HYSTEROSCOPY WITH MYOSURE;  Surgeon: Genia Del, MD;  Location: [facility];  Service: Gynecology;  Laterality: N/A;  request 8:30am OR time in [place] [facility] block requests 45 minutes   right lumpectomy  1970   benign    Current Medications: Outpatient Medications Prior to Visit  Medication Sig Dispense Refill   Ascorbic Acid (VITAMIN C PO) Take 1 tablet by mouth daily.     aspirin EC 81 MG tablet Take 81 mg by mouth daily.      atenolol (TENORMIN) 25  MG tablet TAKE 1 AND 1/2 TABLETS DAILY BY MOUTH 135 tablet 1   hydrochlorothiazide (HYDRODIURIL) 25 MG tablet Take 0.5 tablets by mouth every morning.     irbesartan (AVAPRO) 300 MG tablet Take 150 mg by mouth daily.     Multiple Vitamin (MULTIVITAMIN WITH MINERALS) TABS tablet Take 1 tablet by mouth daily.     rosuvastatin (CRESTOR) 10 MG tablet TAKE 1 TABLET BY MOUTH DAILY AT 6 PM. 90  tablet 1   No facility-administered medications prior to visit.     Allergies:   Amlodipine, Chocolate, Chocolate flavor, Coconut fatty acids, Hydralazine, Hydralazine hcl, Other, and Peanut-containing drug products   Social History   Socioeconomic History   Marital status: Married    Spouse name: Alinda Money   Number of children: 0   Years of education: 12+   Highest education level: Not on file  Occupational History   Not on file  Tobacco Use   Smoking status: Former    Packs/day: 1.00    Years: 5.00    Additional pack years: 0.00    Total pack years: 5.00    Types: Cigarettes    Quit date: 04/22/1979    Years since quitting: 43.3   Smokeless tobacco: Never  Vaping Use   Vaping Use: Never used  Substance and Sexual Activity   Alcohol use: No    Alcohol/week: 0.0 standard drinks of alcohol   Drug use: No   Sexual activity: Yes    Partners: Male    Birth control/protection: Post-menopausal    Comment: 1ST Intercourse- 18, partners-  6  Other Topics Concern   Not on file  Social History Narrative   Lives with husband   Caffeine use: none   Right handed   Social Determinants of Health   Financial Resource Strain: Not on file  Food Insecurity: Not on file  Transportation Needs: Not on file  Physical Activity: Not on file  Stress: Not on file  Social Connections: Not on file     Family History:  The patient's family history includes Heart attack in her father; Hypertension in her mother; Stroke in her father.   ROS General: Negative; No fevers, chills, or night sweats;  HEENT:  Negative; No changes in vision or hearing, sinus congestion, difficulty swallowing Pulmonary: Negative; No cough, wheezing, shortness of breath, hemoptysis Cardiovascular: Hypertension, no chest pain GI: Negative; No nausea, vomiting, diarrhea, or abdominal pain GU: Negative; No dysuria, hematuria, or difficulty voiding Musculoskeletal: Negative; no myalgias, joint pain, or weakness Hematologic/Oncology: Negative; no easy bruising, bleeding Endocrine: Negative; no heat/cold intolerance; no diabetes Neuro: Negative; no changes in balance, headaches Skin: Negative; No rashes or skin lesions Psychiatric: Negative; No behavioral problems, depression Sleep: Positive for OSA was on CPAP therapy but discontinued 1 to 2 years ago.  She has symptoms of snoring, fatigability, daytime sleepiness, frequent nocturia.   No bruxism, restless legs, hypnogognic hallucinations, no cataplexy  An Epworth Sleepiness Scale score was calculated in the office today and this endorsed at 6.  Other comprehensive 14 point system review is negative.   PHYSICAL EXAM:   VS:  BP 124/68 (BP Location: Left Arm, Patient Position: Sitting, Cuff Size: Normal)   Pulse (!) 53   Ht 5\' 3"  (1.6 m)   Wt 180 lb (81.6 kg)   BMI 31.89 kg/m     Repeat blood pressure by me was 142/70  Wt Readings from Last 3 Encounters:  08/06/22 180 lb (81.6 kg)  06/19/22 181 lb (82.1 kg)  06/17/22 181 lb (82.1 kg)    General: Alert, oriented, no distress.  Skin: normal turgor, no rashes, warm and dry HEENT: Normocephalic, atraumatic. Pupils equal round and reactive to light; sclera anicteric; extraocular muscles intact;  Nose without nasal septal hypertrophy Mouth/Parynx benign; Mallinpatti scale 3 Neck: No JVD, no carotid bruits; normal carotid upstroke Lungs: clear to ausculatation and percussion; no wheezing or rales Chest wall: without tenderness to palpitation Heart: PMI not displaced, RRR,  s1 s2 normal, 1/6 systolic murmur, no  diastolic murmur, no rubs, gallops, thrills, or heaves Abdomen: soft, nontender; no hepatosplenomehaly, BS+; abdominal aorta nontender and not dilated by palpation. Back: no CVA tenderness Pulses 2+ Musculoskeletal: full range of motion, normal strength, no joint deformities Extremities: no clubbing cyanosis or edema, Homan's sign negative  Neurologic: grossly nonfocal; Cranial nerves grossly wnl Psychologic: Normal mood and affect   Studies/Labs Reviewed:   April 17, 2024ECG (independently read by me): Sinus brdycardia at 62, LVH  June 03, 2022 ECG (independently read by me): NSR at 65, mild sinus arrythmia  I personally reviewed the ECG from May 04, 2019 which showed sinus bradycardia 57.  Normal intervals.  No ectopy.  Recent Labs:    Latest Ref Rng & Units 05/09/2020    9:19 PM 12/05/2019   12:57 PM 10/13/2019    9:45 AM  BMP  Glucose 70 - 99 mg/dL 88  78  76   BUN 8 - 23 mg/dL Creatinine 0.44 - 1.00 mg/dL 1.91  4.78  2.95   BUN/Creat Ratio 12 - Sodium 135 - 145 mmol/L 140  139  142   Potassium 3.5 - 5.1 mmol/L 3.9  4.5  4.2   Chloride 98 - 111 mmol/L 105  105  107   CO2 22 - 32 mmol/L Calcium 8.9 - 10.3 mg/dL 9.0  9.0  9.3         Latest Ref Rng & Units 05/09/2020    9:19 PM 12/05/2019   12:57 PM 10/13/2019    9:45 AM  Hepatic Function  Total Protein 6.5 - 8.1 g/dL 6.6  6.4  6.5   Albumin 3.5 - 5.0 g/dL 3.9  4.1  4.2   AST 15 - 41 U/L ALT 0 - 44 U/L 28  27  35   Alk Phosphatase 38 - 126 U/L 97  124  123   Total Bilirubin 0.3 - 1.2 mg/dL 0.5  0.6  0.3        Latest Ref Rng & Units 05/09/2020    9:19 PM 09/29/2019    1:44 PM 03/04/2017    9:08 AM  CBC  WBC 4.0 - 10.5 K/uL 4.5  5.7  4.7   Hemoglobin 12.0 - 15.0 g/dL 62.1  30.8  65.7   Hematocrit 36.0 - 46.0 % 40.9  37.5  37.9   Platelets 150 - 400 K/uL 205  295  249    Lab Results  Component Value Date   MCV 94.7 05/09/2020   MCV 93.3 09/29/2019    MCV 90.2 03/04/2017   Lab Results  Component Value Date   TSH 1.480 11/28/2015   Lab Results  Component Value Date   HGBA1C 5.8 (H) 11/28/2015     BNP No results found for: "BNP"  ProBNP No results found for: "PROBNP"   Lipid Panel     Component Value Date/Time   CHOL 127 12/05/2019 1257   TRIG 42 12/05/2019 1257   HDL 67 12/05/2019 1257   CHOLHDL 1.9 12/05/2019 1257   CHOLHDL 2.6 10/28/2009 0455   VLDL 11 10/28/2009 0455   LDLCALC 50 12/05/2019 1257   LDLDIRECT 121.6 10/13/2006 1729   LABVLDL 10 12/05/2019 1257     RADIOLOGY: No results found.   Additional studies/ records that were reviewed today include:  A new download  was obtained in the office from Sep 11, 2019 through October 10, 2019.  Download was obtained from March 18 through August 05, 2022.  ASSESSMENT:    1. OSA (obstructive sleep apnea)   2. Essential hypertension   3. Hyperlipidemia LDL goal <70   4. CAD in native artery   5. Mild obesity      PLAN:  Ms. Arsema Tusing is a 72 year old female who has a history of hypertension, moderate CAD, brain aneurysm, as well as obstructive sleep apnea who previously was on CPAP therapy and remotely discontinued therapy.  On her initial evaluation on May 17, 2018, she was found to have moderate overall sleep apnea with an AHI of 22.4/h and RDI of 26.8/h.  Sleep apnea was severe during REM sleep with an AHI of 49.8/h and there was severe oxygen desaturation to 78%.  Following her CPAP titration study on June 20, 2018 she initiated CPAP therapy at 10 cm of water and was using a nasal pillow AirFit P30 mask.  On subsequent evaluation, she was changed to an auto mode.  At her evaluation in February 2024, she apparently had been off CPAP therapy for several years.  She reinstituted therapy and at that office visit, her CPAP pressures were adjusted to a range of 11 to 20 cm.  During that evaluation, I had an extensive discussion with her regarding the importance of  resumption of therapy and potential adverse cardiovascular consequences if CPAP was not utilized.  Her mask was changed to a ResMed AirFit N30i mask to reduce potential nasal irritation from her previous nasal pillows.  Since her February 2024 office visit, she has resumed therapy but continues to be suboptimal with only 14 of 30 days of use over the past month.  She is averaging 5 hours and 47 minutes.  AHI is increased to 13.1.  As result I will change her CPAP pressure range to 13 - 20, I recommended that she not apply cream on her face after she washes her face at night since this is contributing to poor mask seal and increase leak.  Typically she has been going to bed between midnight and 1 AM and wakes up at 6 AM.  I discussed optimal sleep duration at least 7 hours with 7 and 9 hours as normal.  She had used [employer] as her DME company and she will need to be referred to a new DME company based on her insurance.  Her blood pressure today on repeat by me was mildly increased.  She has been on irbesartan 300 mg, HCTZ 12.5 mg, atenolol 37.5 mg daily.  If she does have ankle swelling, she can take an extra 12.5 mg of HCTZ on an as-needed basis.  She continues to be on rosuvastatin 10 mg daily.  She will follow-up with Dr. Chilton Si for cardiology care to see her in 1 year for reevaluation or as needed.   Medication Adjustments/Labs and Tests Ordered: Current medicines are reviewed at length with the patient today.  Concerns regarding medicines are outlined above.  Medication changes, Labs and Tests ordered today are listed in the Patient Instructions below. Patient Instructions  Medication Instructions:  The current medical regimen is effective;  continue present plan and medications.  *If you need a refill on your cardiac medications before your next appointment, please call your pharmacy*   Follow-Up: At [facility], you and your health needs are our priority.  As part of our  continuing mission to provide you  with exceptional heart care, we have created designated Provider Care Teams.  These Care Teams include your primary Cardiologist (physician) and Advanced Practice Providers (APPs -  Physician Assistants and Nurse Practitioners) who all work together to provide you with the care you need, when you need it.  We recommend signing up for the patient portal called "MyChart".  Sign up information is provided on this After Visit Summary.  MyChart is used to connect with patients for Virtual Visits (Telemedicine).  Patients are able to view lab/test results, encounter notes, upcoming appointments, etc.  Non-urgent messages can be sent to your provider as well.   To learn more about what you can do with MyChart, go to [email or web address].    Your next appointment:   12 month(s)  Provider:   Nicki Guadalajara, MD (sleep)     Signed, Nicki Guadalajara, MD, [facility], ABSM Diplomate, American Board of Sleep Medicine  08/07/2022 5:57 PM    [facility] [address], [place], [place]  [address] Phone: [phone]

## 2022-08-07 ENCOUNTER — Encounter: Payer: Self-pay | Admitting: Cardiovascular Disease

## 2022-08-28 DIAGNOSIS — E7800 Pure hypercholesterolemia, unspecified: Secondary | ICD-10-CM | POA: Diagnosis not present

## 2022-08-28 DIAGNOSIS — R5383 Other fatigue: Secondary | ICD-10-CM | POA: Diagnosis not present

## 2022-08-28 DIAGNOSIS — E559 Vitamin D deficiency, unspecified: Secondary | ICD-10-CM | POA: Diagnosis not present

## 2022-08-28 DIAGNOSIS — I10 Essential (primary) hypertension: Secondary | ICD-10-CM | POA: Diagnosis not present

## 2022-08-28 DIAGNOSIS — R350 Frequency of micturition: Secondary | ICD-10-CM | POA: Diagnosis not present

## 2022-09-09 DIAGNOSIS — Z0000 Encounter for general adult medical examination without abnormal findings: Secondary | ICD-10-CM | POA: Diagnosis not present

## 2022-09-09 DIAGNOSIS — I10 Essential (primary) hypertension: Secondary | ICD-10-CM | POA: Diagnosis not present

## 2022-09-09 DIAGNOSIS — E7800 Pure hypercholesterolemia, unspecified: Secondary | ICD-10-CM | POA: Diagnosis not present

## 2022-09-09 DIAGNOSIS — M8589 Other specified disorders of bone density and structure, multiple sites: Secondary | ICD-10-CM | POA: Diagnosis not present

## 2022-09-09 DIAGNOSIS — Z01419 Encounter for gynecological examination (general) (routine) without abnormal findings: Secondary | ICD-10-CM | POA: Diagnosis not present

## 2022-09-09 DIAGNOSIS — I2510 Atherosclerotic heart disease of native coronary artery without angina pectoris: Secondary | ICD-10-CM | POA: Diagnosis not present

## 2022-09-09 DIAGNOSIS — I671 Cerebral aneurysm, nonruptured: Secondary | ICD-10-CM | POA: Diagnosis not present

## 2022-09-09 DIAGNOSIS — N183 Chronic kidney disease, stage 3 unspecified: Secondary | ICD-10-CM | POA: Diagnosis not present

## 2022-09-09 DIAGNOSIS — Z780 Asymptomatic menopausal state: Secondary | ICD-10-CM | POA: Diagnosis not present

## 2022-09-09 DIAGNOSIS — E559 Vitamin D deficiency, unspecified: Secondary | ICD-10-CM | POA: Diagnosis not present

## 2022-09-23 ENCOUNTER — Encounter (HOSPITAL_BASED_OUTPATIENT_CLINIC_OR_DEPARTMENT_OTHER): Payer: Self-pay

## 2022-09-23 ENCOUNTER — Encounter (HOSPITAL_BASED_OUTPATIENT_CLINIC_OR_DEPARTMENT_OTHER): Payer: Self-pay | Admitting: Cardiovascular Disease

## 2022-09-23 ENCOUNTER — Telehealth (HOSPITAL_BASED_OUTPATIENT_CLINIC_OR_DEPARTMENT_OTHER): Payer: Self-pay | Admitting: Cardiovascular Disease

## 2022-09-23 ENCOUNTER — Telehealth: Payer: Self-pay | Admitting: Cardiovascular Disease

## 2022-09-23 ENCOUNTER — Ambulatory Visit (INDEPENDENT_AMBULATORY_CARE_PROVIDER_SITE_OTHER): Payer: Medicare Other | Admitting: Cardiovascular Disease

## 2022-09-23 VITALS — BP 130/70 | HR 60 | Ht 63.0 in | Wt 176.0 lb

## 2022-09-23 DIAGNOSIS — G4733 Obstructive sleep apnea (adult) (pediatric): Secondary | ICD-10-CM | POA: Diagnosis not present

## 2022-09-23 DIAGNOSIS — E7800 Pure hypercholesterolemia, unspecified: Secondary | ICD-10-CM

## 2022-09-23 DIAGNOSIS — I10 Essential (primary) hypertension: Secondary | ICD-10-CM | POA: Diagnosis not present

## 2022-09-23 DIAGNOSIS — I671 Cerebral aneurysm, nonruptured: Secondary | ICD-10-CM

## 2022-09-23 MED ORDER — ATENOLOL 25 MG PO TABS: 25.0000 mg | ORAL_TABLET | Freq: Every day | ORAL | 3 refills | Status: DC

## 2022-09-23 NOTE — Progress Notes (Deleted)
Cardiology Office Note   Date:  09/23/2022   ID:  Christine Steele, Christine Steele [birth year], MRN [id number]  Patient Location: Home Provider Location: Office  PCP:  Merri Brunette, MD  Cardiologist:  Chilton Si, MD  Electrophysiologist:  None   Evaluation Performed:  Follow-Up Visit  Chief Complaint:  hypertension  History of Present Illness:    Christine Steele is a 72 y.o. female with moderate CAD, hypertension, brain aneurysm, and OSA on CPAP here for follow up.  She was initially seen 03/2018 for chest tightness.  Christine Steele reported episodes of chest discomfort associated with L arm weakness.  In general she has been feeling weak and tired.  The chest discomfort comes and goes.  She first noticed it after she switched from bisoprolol to atenolol.  The pain is up to 8/10 in severity and is not associated with shortness of breath, nausea or diaphoresis.  She was referred for a Lexiscan Myoview 03/2018 that was negative for ischemia.  She continued to have atypical symptoms and had a coronary CT-A 06/2019 that showed moderate LAD disease.  Calcium score was 98th percentile.    Christine Steele has been experiencing spells of feeling weak and then radiating into her chest and left side.  Her PCP stopped HCTZ to see if that helped.  Her BP became elevated.  Losartan was switched to irbesartan.  She was seen in the ED 09/20/2019 with BP in the 160s. She has been participating in the PREP program to [facility].  Her blood pressures are noted to be high there as well.  Hydralazine was added to her regimen 08/2019.  She followed up with Dr. Renne Steele and hydralazine was stopped.  Atenolol was increased to 50mg  and then her BP was running in the 80s-110s/50-60s and she was feeling better but tired.  At her last visit she had done a great job of weight loss and blood pressures had stabilized.  Past Medical History:  Diagnosis Date   Aneurysm (HCC)    2mm Right MCA   Arthritis    Cataract    Bilateral   Chronic kidney  disease    Dr. Renne Steele (PCP)    Hypertension    OSA on CPAP    havent used CPAP in long time per patient due to unable to get filters to change out   Osteopenia 01/2017   T score -1.4 FRAX 3.4% / 0.4%   Tachycardia    Vitamin D deficiency     Past Surgical History:  Procedure Laterality Date   BIOPSY BREAST  1970   Right Breast   COLONOSCOPY     DILATATION & CURETTAGE/HYSTEROSCOPY WITH MYOSURE N/A 03/06/2017   Procedure: DILATATION & CURETTAGE/HYSTEROSCOPY WITH MYOSURE;  Surgeon: Genia Del, MD;  Location: [facility];  Service: Gynecology;  Laterality: N/A;  Request 7:30am in [place] [facility] block time  requests one hour   DILATATION & CURETTAGE/HYSTEROSCOPY WITH MYOSURE N/A 10/03/2019   Procedure: DILATATION & CURETTAGE/HYSTEROSCOPY WITH MYOSURE;  Surgeon: Genia Del, MD;  Location: [facility] [place];  Service: Gynecology;  Laterality: N/A;  request 8:30am OR time in [place] [facility] block requests 45 minutes   right lumpectomy  1970   benign     Current Outpatient Medications  Medication Sig Dispense Refill   Ascorbic Acid (VITAMIN C PO) Take 1 tablet by mouth daily.     aspirin EC 81 MG tablet Take 81 mg by mouth daily.      hydrochlorothiazide (HYDRODIURIL) 25 MG  tablet Take 0.5 tablets by mouth every morning.     irbesartan (AVAPRO) 300 MG tablet Take 150 mg by mouth daily.     Multiple Vitamin (MULTIVITAMIN WITH MINERALS) TABS tablet Take 1 tablet by mouth daily.     rosuvastatin (CRESTOR) 10 MG tablet TAKE 1 TABLET BY MOUTH DAILY AT 6 PM. 90 tablet 1   atenolol (TENORMIN) 25 MG tablet Take 1 tablet (25 mg total) by mouth daily. 90 tablet 3   Calcium Carbonate-Vit D-Min (CALCIUM 1200 PO) Take 1 tablet by mouth daily. (Patient not taking: Reported on 09/23/2022)     Cholecalciferol (VITAMIN D3) 250 MCG (10000 UT) capsule Take 10,000 Units by mouth once a week. (Patient not taking: Reported on 09/23/2022)     No current facility-administered  medications for this visit.    Allergies:   Amlodipine, Chocolate, Chocolate flavor, Coconut fatty acids, Hydralazine, Hydralazine hcl, Other, and Peanut-containing drug products    Social History:  The patient  reports that she quit smoking about 43 years ago. Her smoking use included cigarettes. She has a 5.00 pack-year smoking history. She has never used smokeless tobacco. She reports that she does not drink alcohol and does not use drugs.   Family History:  The patient's family history includes Heart attack in her father; Hypertension in her mother; Stroke in her father.    ROS:  Please see the history of present illness.   Otherwise, review of systems are positive for none.   All other systems are reviewed and negative.    PHYSICAL EXAM: VS:  BP 130/70   Pulse 60   Ht 5\' 3"  (1.6 m)   Wt 176 lb (79.8 kg)   BMI 31.18 kg/m  , BMI Body mass index is 31.18 kg/m. GENERAL:  Well appearing HEENT: Pupils equal round and reactive, fundi not visualized, oral mucosa unremarkable NECK:  No jugular venous distention, waveform within normal limits, carotid upstroke brisk and symmetric, no bruits, no thyromegaly LYMPHATICS:  No cervical adenopathy LUNGS:  Clear to auscultation bilaterally HEART:  RRR.  PMI not displaced or sustained,S1 and S2 within normal limits, no S3, no S4, no clicks, no rubs, no murmurs ABD:  Flat, positive bowel sounds normal in frequency in pitch, no bruits, no rebound, no guarding, no midline pulsatile mass, no hepatomegaly, no splenomegaly EXT:  2 plus pulses throughout, no edema, no cyanosis no clubbing SKIN:  No rashes no nodules NEURO:  Cranial nerves II through XII grossly intact, motor grossly intact throughout PSYCH:  Cognitively intact, oriented to person place and time  EKG:  EKG is not ordered today. 05/04/19: Sinus bradycardia.  Rate 57 bpm.   Lexiscan Myoview  04/02/18: Nuclear stress EF: 52%. The left ventricular ejection fraction is mildly decreased  (45-54%). T wave inversion was noted during stress in the V4, V5, aVF and V3 leads. This is a low risk study. No evidence of ischemia. No previous MI The study is normal.    Recent Labs: No results found for requested labs within last 365 days.    Lipid Panel    Component Value Date/Time   CHOL 127 12/05/2019 1257   TRIG 42 12/05/2019 1257   HDL 67 12/05/2019 1257   CHOLHDL 1.9 12/05/2019 1257   CHOLHDL 2.6 10/28/2009 0455   VLDL 11 10/28/2009 0455   LDLCALC 50 12/05/2019 1257   LDLDIRECT 121.6 10/13/2006 1729      Wt Readings from Last 3 Encounters:  09/23/22 176 lb (79.8 kg)  08/06/22 180  lb (81.6 kg)  06/19/22 181 lb (82.1 kg)      ASSESSMENT AND PLAN:  Assessment and Plan    Chest discomfort: Occurs when patient is fatigued and lasts for a couple of days. No associated exertional symptoms. Likely not cardiac in origin. -Encouraged adequate rest and sleep.  Hypertension: Blood pressure readings vary, with some readings being low. Patient experiences occasional lightheadedness and blurred vision, possibly related to low blood pressure. -Reduce Atenolol from 1.5 tablets to 1 tablet daily. -Continue Hydrochlorothiazide and Irbesartan as currently prescribed. -Check blood pressure twice daily and record readings for one month.  Sleep Apnea: Patient has been unable to use CPAP machine due to lack of supplies. -Advised patient to contact Dr. Landry Dyke office to obtain necessary supplies.  Hyperlipidemia: LDL cholesterol is well controlled (59). -Continue Rosuvastatin.  GERD: Patient concerned about potential side effects of Pantoprazole. -Reassured patient that Pantoprazole is safe for her to use and does not increase her risk of heart attack or stroke.  Follow-up: -See pharmacist in one month to review blood pressure readings and adjust medication as necessary. -Regular appointment in one year, or sooner if needed.         Current medicines are reviewed at  length with the patient today.  The patient does not have concerns regarding medicines.  The following changes have been made:  Reduced atenolol to 25 mg daily  Labs/ tests ordered today include:   Orders Placed This Encounter  Procedures   AMB Referral to [facility] Pharm-D     Disposition:   FU with Kiko Ripp C. Duke Salvia, MD, [facility] in 1 year.  PharmD in one month   Signed, Yuna Pizzolato C. Duke Salvia, MD, [facility]  09/23/2022 10:17 AM    [facility]

## 2022-09-23 NOTE — Telephone Encounter (Signed)
Spoke to the patient, she currently uses CPAP machine, had the same mask since Feb of this year, [employer] was the supplier for her CPAP supplies. However, this company is no longer in business. [facility] is the new supplier. Called [employer], spoke with Lorelle Formosa with the supply team, she has created an account for the patient, she will send Dr. Tresa Endo e-script, once that has been received and processed then [facility] will send CPAP supplies to the patient.  Called the patient explained the process, gave her the phone number to [employer], and pt voiced understanding.  Will forward to sleep study.

## 2022-09-23 NOTE — Telephone Encounter (Signed)
Calling with questions about her cpap machine. Please advise

## 2022-09-23 NOTE — Patient Instructions (Signed)
Medication Instructions:  DECREASE YOUR ATENOLOL TO 1 TABLET DAILY   *If you need a refill on your cardiac medications before your next appointment, please call your pharmacy*   Lab Work: NONE If you have labs (blood work) drawn today and your tests are completely normal, you will receive your results only by: MyChart Message (if you have MyChart) OR A paper copy in the mail If you have any lab test that is abnormal or we need to change your treatment, we will call you to review the results.   Testing/Procedures: NONE   Follow-Up: At [facility], you and your health needs are our priority.  As part of our continuing mission to provide you with exceptional heart care, we have created designated Provider Care Teams.  These Care Teams include your primary Cardiologist (physician) and Advanced Practice Providers (APPs -  Physician Assistants and Nurse Practitioners) who all work together to provide you with the care you need, when you need it.  We recommend signing up for the patient portal called "MyChart".  Sign up information is provided on this After Visit Summary.  MyChart is used to connect with patients for Virtual Visits (Telemedicine).  Patients are able to view lab/test results, encounter notes, upcoming appointments, etc.  Non-urgent messages can be sent to your provider as well.   To learn more about what you can do with MyChart, go to [email or web address].    Your next appointment:   12 month(s)  Provider:   Chilton Si, MD    PHARM D 1-2 MONTHS  Other Instructions MONITOR YOUR BLOOD PRESSURE TWICE A DAY, LOG ON SHEETS PROVIDED. BRING LOG AND YOUR BLOOD PRESSURE MACHINE TO YOUR FOLLOW UP IN 1-2 MONTHS

## 2022-09-23 NOTE — Progress Notes (Signed)
Cardiology Office Note   Date:  09/23/2022   ID:  Christine Steele [birth year], MRN [id number]  PCP:  Merri Brunette, MD  Cardiologist:  Chilton Si, MD  Electrophysiologist:  None   Evaluation Performed:  Follow-Up Visit  Chief Complaint:  hypertension  History of Present Illness:    Christine Steele is a 72 y.o. female with moderate CAD, hypertension, brain aneurysm, and OSA on CPAP here for follow up.  She was initially seen 03/2018 for chest tightness.  [name] reported episodes of chest discomfort associated with L arm weakness.  In general she has been feeling weak and tired.  The chest discomfort comes and goes.  She first noticed it after she switched from bisoprolol to atenolol.  The pain is up to 8/10 in severity and is not associated with shortness of breath, nausea or diaphoresis.  She was referred for a Lexiscan Myoview 03/2018 that was negative for ischemia.  She continued to have atypical symptoms and had a coronary CT-A 06/2019 that showed moderate LAD disease.  Calcium score was 98th percentile.    [name] has been experiencing spells of feeling weak and then radiating into her chest and left side.  Her PCP stopped HCTZ to see if that helped.  Her BP became elevated.  Losartan was switched to irbesartan.  She was seen in the ED 09/20/2019 with BP in the 160s. She has been participating in the PREP program to [facility] [address] [facility]  Her blood pressures are noted to be high there as well.  Hydralazine was added to her regimen 08/2019.  She followed up with Dr. Renne Steele and hydralazine was stopped.  Atenolol was increased to 50mg  and then her BP was running in the 80s-110s/50-60s and she was feeling better but tired.  At her last visit she had done a great job of weight loss and blood pressures had stabilized.  Today, she states she is doing alright. However, whenever she overworks herself or doesn't get enough rest she notices increased chest discomfort. Initially she will feel weak with a "bad  feeling" in her chest. Her symptoms will improve somewhat with lying down and resting, but she continues to feel the discomfort for a couple days before it subsides on its own. Of note, she has been helping to care for her mother who has dementia. She is a light sleeper and often wakes up with every sound. She was using her CPAP regularly. Lately she hasn't been able to obtain replacement supplies due to backorder. In clinic today her blood pressure is 130/70. On average, she sees blood pressures of 120-135 prior to her meds, and lower readings like 116 systolic at night. At home, she sometimes doesn't take her antihypertensives due to low blood pressures such as 107 systolic in the mornings. On other mornings her blood pressure is closer to 130/70-80 so she takes her medications. Subsequently her blood pressure will drop "really low". She does feel lightheaded sometimes especially when standing up; she denies syncope. When this occurs, she also describes this as "not really lightheaded, more like blurry vision." She has seen her ophthalmologist regarding this. Her symptoms were not thought to be due to her eyes. Sometimes she will have LE edema depending on her diet, especially with high sodium foods. Her swelling does improve with taking her diuretic. She was participating in the Wellness program but had been out of town for the past few weeks. Generally she feels well when going for a walk or using  stairs. No associated anginal symptoms. She denies any palpitations, shortness of breath, headaches, orthopnea, or PND.  Past Medical History:  Diagnosis Date   Aneurysm (HCC)    2mm Right MCA   Arthritis    Cataract    Bilateral   Chronic kidney disease    Dr. Renne Steele (PCP)    Hypertension    OSA on CPAP    havent used CPAP in long time per patient due to unable to get filters to change out   Osteopenia 01/2017   T score -1.4 FRAX 3.4% / 0.4%   Tachycardia    Vitamin D deficiency     Past Surgical  History:  Procedure Laterality Date   BIOPSY BREAST  1970   Right Breast   COLONOSCOPY     DILATATION & CURETTAGE/HYSTEROSCOPY WITH MYOSURE N/A 03/06/2017   Procedure: DILATATION & CURETTAGE/HYSTEROSCOPY WITH MYOSURE;  Surgeon: Christine Del, MD;  Location: [facility];  Service: Gynecology;  Laterality: N/A;  Request 7:30am in [place] [facility] block time  requests one hour   DILATATION & CURETTAGE/HYSTEROSCOPY WITH MYOSURE N/A 10/03/2019   Procedure: DILATATION & CURETTAGE/HYSTEROSCOPY WITH MYOSURE;  Surgeon: Christine Del, MD;  Location: [facility];  Service: Gynecology;  Laterality: N/A;  request 8:30am OR time in [place] [facility] block requests 45 minutes   right lumpectomy  1970   benign     Current Outpatient Medications  Medication Sig Dispense Refill   Ascorbic Acid (VITAMIN C PO) Take 1 tablet by mouth daily.     aspirin EC 81 MG tablet Take 81 mg by mouth daily.      hydrochlorothiazide (HYDRODIURIL) 25 MG tablet Take 0.5 tablets by mouth every morning.     irbesartan (AVAPRO) 300 MG tablet Take 150 mg by mouth daily.     Multiple Vitamin (MULTIVITAMIN WITH MINERALS) TABS tablet Take 1 tablet by mouth daily.     rosuvastatin (CRESTOR) 10 MG tablet TAKE 1 TABLET BY MOUTH DAILY AT 6 PM. 90 tablet 1   atenolol (TENORMIN) 25 MG tablet Take 1 tablet (25 mg total) by mouth daily. 90 tablet 3   Calcium Carbonate-Vit D-Min (CALCIUM 1200 PO) Take 1 tablet by mouth daily. (Patient not taking: Reported on 09/23/2022)     Cholecalciferol (VITAMIN D3) 250 MCG (10000 UT) capsule Take 10,000 Units by mouth once a week. (Patient not taking: Reported on 09/23/2022)     No current facility-administered medications for this visit.    Allergies:   Amlodipine, Chocolate, Chocolate flavor, Coconut fatty acids, Hydralazine, Hydralazine hcl, Other, and Peanut-containing drug products    Social History:  The patient  reports that she quit smoking about 43 years  ago. Her smoking use included cigarettes. She has a 5.00 pack-year smoking history. She has never used smokeless tobacco. She reports that she does not drink alcohol and does not use drugs.   Family History:  The patient's family history includes Heart attack in her father; Hypertension in her mother; Stroke in her father.    ROS:   Please see the history of present illness.    (+) Chest discomfort (+) Weakness (+) Fatigue (+) Daytime somnolence (+) Lightheadedness/Blurry vision (+) Intermittent LE edema All other systems are reviewed and negative.    PHYSICAL EXAM: VS:  BP 130/70   Pulse 60   Ht 5\' 3"  (1.6 m)   Wt 176 lb (79.8 kg)   BMI 31.18 kg/m  , BMI Body mass index is 31.18 kg/m. GENERAL:  Well appearing  HEENT: Pupils equal round and reactive, fundi not visualized, oral mucosa unremarkable NECK:  No jugular venous distention, waveform within normal limits, carotid upstroke brisk and symmetric, no bruits, no thyromegaly LYMPHATICS:  No cervical adenopathy LUNGS:  Clear to auscultation bilaterally HEART:  RRR.  PMI not displaced or sustained,S1 and S2 within normal limits, no S3, no S4, no clicks, no rubs, no murmurs ABD:  Flat, positive bowel sounds normal in frequency in pitch, no bruits, no rebound, no guarding, no midline pulsatile mass, no hepatomegaly, no splenomegaly EXT:  2 plus pulses throughout, no edema, no cyanosis no clubbing SKIN:  No rashes no nodules NEURO:  Cranial nerves II through XII grossly intact, motor grossly intact throughout PSYCH:  Cognitively intact, oriented to person place and time   EKG:   EKG is personally reviewed. 09/23/2022: Not ordered. 05/04/19: Sinus bradycardia.  Rate 57 bpm.    CTA Head  07/23/2020: IMPRESSION: No acute intracranial abnormality.   Stable 2 mm aneurysm of the proximal to mid right M1 MCA.  Coronary CTA  07/08/2019: IMPRESSION: 1. Coronary artery calcium score 968 Agatston units. This places the patient in the  98th percentile for age and gender, suggesting high risk for future cardiac events.   2.  Suspect mild proximal RCA stenosis.   3. Extensive calcified plaque in the ostial/proximal LAD. Blooming artifact makes degree of stenosis hard to comment upon. Possible moderate-range stenosis, doubt critical. Will send for FFR.  Lexiscan Myoview  04/02/18: Nuclear stress EF: 52%. The left ventricular ejection fraction is mildly decreased (45-54%). T wave inversion was noted during stress in the V4, V5, aVF and V3 leads. This is a low risk study. No evidence of ischemia. No previous MI The study is normal.    Recent Labs: No results found for requested labs within last 365 days.    Lipid Panel    Component Value Date/Time   CHOL 127 12/05/2019 1257   TRIG 42 12/05/2019 1257   HDL 67 12/05/2019 1257   CHOLHDL 1.9 12/05/2019 1257   CHOLHDL 2.6 10/28/2009 0455   VLDL 11 10/28/2009 0455   LDLCALC 50 12/05/2019 1257   LDLDIRECT 121.6 10/13/2006 1729     Wt Readings from Last 3 Encounters:  09/23/22 176 lb (79.8 kg)  08/06/22 180 lb (81.6 kg)  06/19/22 181 lb (82.1 kg)      ASSESSMENT AND PLAN:     Chest discomfort: Occurs when patient is fatigued and lasts for a couple of days. No associated exertional symptoms. Likely not cardiac in origin. -Encouraged adequate rest and sleep.  Hypertension: Blood pressure readings vary, with some readings being low. Patient experiences occasional lightheadedness and blurred vision, possibly related to low blood pressure. -Reduce Atenolol from 1.5 tablets to 1 tablet daily. -Continue Hydrochlorothiazide and Irbesartan as currently prescribed. -Check blood pressure twice daily and record readings for one month.  Sleep Apnea: Patient has been unable to use CPAP machine due to lack of supplies. -Advised patient to contact Dr. Landry Dyke office to obtain necessary supplies.  Hyperlipidemia: LDL cholesterol is well controlled (59). -Continue  Rosuvastatin.  GERD: Patient concerned about potential side effects of Pantoprazole. -Reassured patient that Pantoprazole is safe for her to use and does not increase her risk of heart attack or stroke.  Follow-up: -See pharmacist in one month to review blood pressure readings and adjust medication as necessary. -Regular appointment in one year, or sooner if needed.      Disposition:    FU with PharmD in  1 month. FU with Talal Fritchman C. Duke Salvia, MD, [facility] in 1 year.  Medication Adjustments/Labs and Tests Ordered: Current medicines are reviewed at length with the patient today.  Concerns regarding medicines are outlined above.   Orders Placed This Encounter  Procedures   AMB Referral to [facility] Pharm-[facility]   Meds ordered this encounter  Medications   atenolol (TENORMIN) 25 MG tablet    Sig: Take 1 tablet (25 mg total) by mouth daily.    Dispense:  90 tablet    Refill:  3    NEW DOSE, PATIENT WILL CALL WHEN NEEDS FILLED   Patient Instructions  Medication Instructions:  DECREASE YOUR ATENOLOL TO 1 TABLET DAILY   *If you need a refill on your cardiac medications before your next appointment, please call your pharmacy*   Lab Work: NONE If you have labs (blood work) drawn today and your tests are completely normal, you will receive your results only by: MyChart Message (if you have MyChart) OR A paper copy in the mail If you have any lab test that is abnormal or we need to change your treatment, we will call you to review the results.   Testing/Procedures: NONE   Follow-Up: At [facility], you and your health needs are our priority.  As part of our continuing mission to provide you with exceptional heart care, we have created designated Provider Care Teams.  These Care Teams include your primary Cardiologist (physician) and Advanced Practice Providers (APPs -  Physician Assistants and Nurse Practitioners) who all work together to provide you with the care you need, when  you need it.  We recommend signing up for the patient portal called "MyChart".  Sign up information is provided on this After Visit Summary.  MyChart is used to connect with patients for Virtual Visits (Telemedicine).  Patients are able to view lab/test results, encounter notes, upcoming appointments, etc.  Non-urgent messages can be sent to your provider as well.   To learn more about what you can do with MyChart, go to [email or web address].    Your next appointment:   12 month(s)  Provider:   Chilton Si, MD    PHARM D 1-2 MONTHS  Other Instructions MONITOR YOUR BLOOD PRESSURE TWICE A DAY, LOG ON SHEETS PROVIDED. BRING LOG AND YOUR BLOOD PRESSURE MACHINE TO YOUR FOLLOW UP IN 1-2 MONTHS    I,Mathew Stumpf,acting as a scribe for Chilton Si, MD.,have documented all relevant documentation on the behalf of Chilton Si, MD,as directed by  Chilton Si, MD while in the presence of Chilton Si, MD.  I, Avalina Benko C. Duke Salvia, MD have reviewed all documentation for this visit.  The documentation of the exam, diagnosis, procedures, and orders on 09/23/2022 are all accurate and complete.   Signed, Dovie Kapusta C. Duke Salvia, MD, [facility]  09/23/2022 10:28 AM    [facility]

## 2022-09-23 NOTE — Telephone Encounter (Signed)
Created by accident by [facility]

## 2022-09-30 ENCOUNTER — Telehealth: Payer: Self-pay

## 2022-09-30 NOTE — Telephone Encounter (Signed)
Patient called and stated, "My mask is leaking air on the sides because the cushion is worn out." Patient came by office today and was given a new mask and cushion for CPAP.

## 2022-09-30 NOTE — Telephone Encounter (Addendum)
Christine Steele called and spoke to the patient and is currently working with Dr Tresa Endo to get her set up with [facility] for supplies.  Patient was encouraged to call sleep lab to get a replacement mask until she can be set up with a new dme.

## 2022-10-01 NOTE — Telephone Encounter (Signed)
great

## 2022-10-29 ENCOUNTER — Ambulatory Visit (HOSPITAL_BASED_OUTPATIENT_CLINIC_OR_DEPARTMENT_OTHER): Payer: Medicare Other

## 2022-11-20 ENCOUNTER — Telehealth: Payer: Self-pay | Admitting: Podiatry

## 2022-11-20 MED ORDER — METHYLPREDNISOLONE 4 MG PO TBPK: ORAL_TABLET | ORAL | 0 refills | Status: DC

## 2022-11-20 NOTE — Telephone Encounter (Signed)
Rx sent to pharmacy, if not improved from this she should have a f/u appt scheduled

## 2022-11-20 NOTE — Addendum Note (Signed)
Addended byLilian Kapur, Inger Wiest R on: 11/20/2022 08:48 AM   Modules accepted: Orders

## 2022-11-20 NOTE — Telephone Encounter (Signed)
pt having a gout flare up and would like something sent in to they pharmacy

## 2022-11-24 ENCOUNTER — Ambulatory Visit (INDEPENDENT_AMBULATORY_CARE_PROVIDER_SITE_OTHER): Payer: Medicare Other | Admitting: Pharmacist Clinician (PhC)/ Clinical Pharmacy Specialist

## 2022-11-24 ENCOUNTER — Encounter (HOSPITAL_BASED_OUTPATIENT_CLINIC_OR_DEPARTMENT_OTHER): Payer: Self-pay | Admitting: Pharmacist Clinician (PhC)/ Clinical Pharmacy Specialist

## 2022-11-24 ENCOUNTER — Ambulatory Visit (HOSPITAL_BASED_OUTPATIENT_CLINIC_OR_DEPARTMENT_OTHER): Payer: Medicare Other

## 2022-11-24 VITALS — BP 119/77 | HR 56 | Ht 63.0 in | Wt 174.6 lb

## 2022-11-24 DIAGNOSIS — I10 Essential (primary) hypertension: Secondary | ICD-10-CM

## 2022-11-24 MED ORDER — IRBESARTAN 150 MG PO TABS: 150.0000 mg | ORAL_TABLET | Freq: Every day | ORAL | 3 refills | Status: DC

## 2022-11-24 NOTE — Progress Notes (Unsigned)
Office Visit    Patient Name: Christine Steele Date of Encounter: 11/24/2022  Primary Care Provider:  Merri Brunette, MD Primary Cardiologist:  Chilton Si, MD  Chief Complaint    Hypertension  Significant Past Medical History   HLD LDL 59 on rosuvastatin  OSA Currently not on CPAP, needs supplies             Allergies  Allergen Reactions   Amlodipine Swelling and Other (See Comments)    Leg/feet swelling.   Chocolate Other (See Comments)    Headache   Chocolate Flavor     Other reaction(s): headaches   Coconut Fatty Acids Other (See Comments)   Hydralazine Other (See Comments)    ? Fast heart rate   Hydralazine Hcl     Other reaction(s): heart rate increase   Other Other (See Comments)    MSG causes headaches   Peanut-Containing Drug Products Other (See Comments)    All peanuts; headache    History of Present Illness    Christine Steele is a 72 y.o. female patient of Dr Duke Salvia, in the office today for hypertension evaluation.   She was most recently seen by Dr. Duke Salvia in June, at which time her office pressure was 130/70.  She was complaining of occasional low BP readings, leading to what she described as blurry vision.  She saw her ophthalmologist about this, but was told likely not due to her eyes.  She admits to skipping doses of her medications, based on home blood pressure readings.   Today she is in the office for follow up.  She has had a tough summer, taking care of her elderly mother who recently passed at 42.  Hasn't been able to stay in her normal routine, but is hoping to get back to her own schedule in the next week or two.  She did develop gout in her big toe last week and is currently on day 5 (of 6) of methylprednisolone.   Today she notes that she will hold off taking her irbesartan and hctz if her systolic pressure is "low"  and hold atenolol if heart rate is "low".    Blood Pressure Goal:  130/80  Current Medications:  atenolol 25 mg every day,  hctz 12.5 mg every day, irbesartan 150 mg qd  Previously tried:  amlodipine - LEE, hydralazine - tachycardia  Family Hx:   mother had hypertension late in life, just recently died at 32, father died in his 28's, unknown, non cardiac; younger sister with hypertension;    Social Hx:      Tobacco: no  Alcohol: no  Caffeine: only occasionally - caused increase in heart rate  Diet:   tries to eat more home cooked meals; protein is fish and poultry; not as many vegetables recently; snacked more while caring for mother  Exercise: was going to gym - had to stop to care for mother in past few months.   Home BP readings:    current home meter on arm   Accessory Clinical Findings    Lab Results  Component Value Date   CREATININE 1.14 (H) 05/09/2020   BUN 17 05/09/2020   NA 140 05/09/2020   K 3.9 05/09/2020   CL 105 05/09/2020   CO2 26 05/09/2020   Lab Results  Component Value Date   ALT 28 05/09/2020   AST 26 05/09/2020   ALKPHOS 97 05/09/2020   BILITOT 0.5 05/09/2020   Lab Results  Component Value Date  HGBA1C 5.8 (H) 11/28/2015    Home Medications    Current Outpatient Medications  Medication Sig Dispense Refill   Ascorbic Acid (VITAMIN C PO) Take 1 tablet by mouth daily.     aspirin EC 81 MG tablet Take 81 mg by mouth daily.      atenolol (TENORMIN) 25 MG tablet Take 1 tablet (25 mg total) by mouth daily. 90 tablet 3   Calcium Carbonate-Vit D-Min (CALCIUM 1200 PO) Take 1 tablet by mouth daily. (Patient not taking: Reported on 09/23/2022)     Cholecalciferol (VITAMIN D3) 250 MCG (10000 UT) capsule Take 10,000 Units by mouth once a week. (Patient not taking: Reported on 09/23/2022)     hydrochlorothiazide (HYDRODIURIL) 25 MG tablet Take 0.5 tablets by mouth every morning.     methylPREDNISolone (MEDROL DOSEPAK) 4 MG TBPK tablet 6 day dose pack - take as directed 21 tablet 0   Multiple Vitamin (MULTIVITAMIN WITH MINERALS) TABS tablet Take 1 tablet by mouth daily.      rosuvastatin (CRESTOR) 10 MG tablet TAKE 1 TABLET BY MOUTH DAILY AT 6 PM. 90 tablet 1   No current facility-administered medications for this visit.         Assessment & Plan    Essential hypertension Assessment: BP is controlled in office BP 119/77 mmHg;  above the goal (<130/80). Tolerates current medications well without any side effects Skipping doses if she feels vitals are too low Denies SOB, palpitation, chest pain, headaches,or swelling Reiterated the importance of regular exercise and low salt diet   Plan:  Continue taking current medications Only hold irbesartan and hctz if systolic pressure is < 100 Only hold atenolol if HR is < 50 Patient to keep record of BP readings with heart rate and report to [place] at the next visit Patient to follow up with Dr. Duke Salvia in 1 year  Labs ordered today:  none   Phillips Hay PharmD CPP [facility]  [address] [place], [place] [address] [phone]

## 2022-11-24 NOTE — Patient Instructions (Signed)
Follow up appointment: with Dr. Duke Salvia next summer  Take your BP meds as follows: continue with your current medications.  Only hold your BP medications if your BP is < 100 (top number) Only hold your atenolol if your HR is < 50  Check your blood pressure at home daily (if able) and keep record of the readings.  Hypertension "High blood pressure"  Hypertension is often called "The Silent Killer." It rarely causes symptoms until it is extremely  high or has done damage to other organs in the body. For this reason, you should have your  blood pressure checked regularly by your physician. We will check your blood pressure  every time you see a provider at one of our offices.   Your blood pressure reading consists of two numbers. Ideally, blood pressure should be  below 120/80. The first ("top") number is called the systolic pressure. It measures the  pressure in your arteries as your heart beats. The second ("bottom") number is called the diastolic pressure. It measures the pressure in your arteries as the heart relaxes between beats.  The benefits of getting your blood pressure under control are enormous. A 10-point  reduction in systolic blood pressure can reduce your risk of stroke by 27% and heart failure by 28%  Your blood pressure goal is < 130/80  To check your pressure at home you will need to:  1. Sit up in a chair, with feet flat on the floor and back supported. Do not cross your ankles or legs. 2. Rest your left arm so that the cuff is about heart level. If the cuff goes on your upper arm,  then just relax the arm on the table, arm of the chair or your lap. If you have a wrist cuff, we  suggest relaxing your wrist against your chest (think of it as Pledging the Flag with the  wrong arm).  3. Place the cuff snugly around your arm, about 1 inch above the crook of your elbow. The  cords should be inside the groove of your elbow.  4. Sit quietly, with the cuff in place,  for about 5 minutes. After that 5 minutes press the power  button to start a reading. 5. Do not talk or move while the reading is taking place.  6. Record your readings on a sheet of paper. Although most cuffs have a memory, it is often  easier to see a pattern developing when the numbers are all in front of you.  7. You can repeat the reading after 1-3 minutes if it is recommended  Make sure your bladder is empty and you have not had caffeine or tobacco within the last 30 min  Always bring your blood pressure log with you to your appointments. If you have not brought your monitor in to be double checked for accuracy, please bring it to your next appointment.  You can find a list of quality blood pressure cuffs at validatebp.org

## 2022-11-24 NOTE — Assessment & Plan Note (Signed)
Assessment: BP is controlled in office BP 119/77 mmHg;  above the goal (<130/80). Tolerates current medications well without any side effects Skipping doses if she feels vitals are too low Denies SOB, palpitation, chest pain, headaches,or swelling Reiterated the importance of regular exercise and low salt diet   Plan:  Continue taking current medications Only hold irbesartan and hctz if systolic pressure is < 100 Only hold atenolol if HR is < 50 Patient to keep record of BP readings with heart rate and report to [place] at the next visit Patient to follow up with Dr. Duke Salvia in 1 year  Labs ordered today:  none

## 2022-11-24 NOTE — Progress Notes (Deleted)
Office Visit    Patient Name: Christine Steele Date of Encounter: 11/24/2022  Primary Care Provider:  Merri Brunette, MD Primary Cardiologist:  Chilton Si, MD  Chief Complaint    Hypertension  Significant Past Medical History   HLD LDL 59 on rosuvastatin  OSA Currently not on CPAP, needs supplies             Allergies  Allergen Reactions   Amlodipine Swelling and Other (See Comments)    Leg/feet swelling.   Chocolate Other (See Comments)    Headache   Chocolate Flavor     Other reaction(s): headaches   Coconut Fatty Acids Other (See Comments)   Hydralazine Other (See Comments)    ? Fast heart rate   Hydralazine Hcl     Other reaction(s): heart rate increase   Other Other (See Comments)    MSG causes headaches   Peanut-Containing Drug Products Other (See Comments)    All peanuts; headache    History of Present Illness    Christine Steele is a 72 y.o. female patient of Dr Duke Salvia, in the office today for hypertension evaluation.   She was most recently seen by Dr. Duke Salvia in June, at which time her office pressure was 130/70.  She was complaining of occasional low BP readings, leading to what she described as blurry vision.  She saw her ophthalmologist about this, but was told likely not due to her eyes.  She admits to skipping doses of her medications, based on home blood pressure readings.   Blood Pressure Goal:  130/80  Current Medications:  atenolol 25 mg every day, hctz 12.5 mg every day, irbesartan 150 mg qd  Previously tried:  amlodipine - LEE, hydralazine - tachycardia  Family Hx:     Social Hx:      Tobacco:  Alcohol:  Caffeine: Diet:      Exercise:   Home BP readings:      Adherence Assessment  Do you ever forget to take your medication? [] Yes [] No  Do you ever skip doses due to side effects? [] Yes [] No  Do you have trouble affording your medicines? [] Yes [] No  Are you ever unable to pick up your medication due to transportation  difficulties? [] Yes [] No  Do you ever stop taking your medications because you don't believe they are helping? [] Yes [] No  Do you check your weight daily? [] Yes [] No   Adherence strategy: ***  Barriers to obtaining medications: ***     Accessory Clinical Findings    Lab Results  Component Value Date   CREATININE 1.14 (H) 05/09/2020   BUN 17 05/09/2020   NA 140 05/09/2020   K 3.9 05/09/2020   CL 105 05/09/2020   CO2 26 05/09/2020   Lab Results  Component Value Date   ALT 28 05/09/2020   AST 26 05/09/2020   ALKPHOS 97 05/09/2020   BILITOT 0.5 05/09/2020   Lab Results  Component Value Date   HGBA1C 5.8 (H) 11/28/2015    Home Medications    Current Outpatient Medications  Medication Sig Dispense Refill   Ascorbic Acid (VITAMIN C PO) Take 1 tablet by mouth daily.     aspirin EC 81 MG tablet Take 81 mg by mouth daily.      atenolol (TENORMIN) 25 MG tablet Take 1 tablet (25 mg total) by mouth daily. 90 tablet 3   Calcium Carbonate-Vit D-Min (CALCIUM 1200 PO) Take 1 tablet by mouth daily. (Patient not taking: Reported on 09/23/2022)  Cholecalciferol (VITAMIN D3) 250 MCG (10000 UT) capsule Take 10,000 Units by mouth once a week. (Patient not taking: Reported on 09/23/2022)     hydrochlorothiazide (HYDRODIURIL) 25 MG tablet Take 0.5 tablets by mouth every morning.     irbesartan (AVAPRO) 300 MG tablet Take 150 mg by mouth daily.     methylPREDNISolone (MEDROL DOSEPAK) 4 MG TBPK tablet 6 day dose pack - take as directed 21 tablet 0   Multiple Vitamin (MULTIVITAMIN WITH MINERALS) TABS tablet Take 1 tablet by mouth daily.     rosuvastatin (CRESTOR) 10 MG tablet TAKE 1 TABLET BY MOUTH DAILY AT 6 PM. 90 tablet 1   No current facility-administered medications for this visit.     No BP recorded.  {Refresh Note OR Click here to enter BP  :1}***   Assessment & Plan    No problem-specific Assessment & Plan notes found for this encounter.   Phillips Hay PharmD CPP [facility]  [address] [place], [place] [address] [phone]

## 2022-12-30 ENCOUNTER — Other Ambulatory Visit (HOSPITAL_BASED_OUTPATIENT_CLINIC_OR_DEPARTMENT_OTHER): Payer: Self-pay | Admitting: Cardiovascular Disease

## 2022-12-30 DIAGNOSIS — I10 Essential (primary) hypertension: Secondary | ICD-10-CM

## 2023-01-08 ENCOUNTER — Other Ambulatory Visit (HOSPITAL_BASED_OUTPATIENT_CLINIC_OR_DEPARTMENT_OTHER): Payer: Self-pay | Admitting: Cardiovascular Disease

## 2023-01-14 ENCOUNTER — Other Ambulatory Visit: Payer: Self-pay | Admitting: Registered Nurse

## 2023-01-14 ENCOUNTER — Ambulatory Visit
Admission: RE | Admit: 2023-01-14 | Discharge: 2023-01-14 | Disposition: A | Discharge status: Home / Self Care | Payer: Medicare Other | Source: Ambulatory Visit | Attending: Registered Nurse | Admitting: Registered Nurse

## 2023-01-14 DIAGNOSIS — I671 Cerebral aneurysm, nonruptured: Secondary | ICD-10-CM | POA: Diagnosis not present

## 2023-01-14 DIAGNOSIS — R519 Headache, unspecified: Secondary | ICD-10-CM | POA: Diagnosis not present

## 2023-01-14 MED ORDER — IOPAMIDOL (ISOVUE-370) INJECTION 76%
200.0000 mL | Freq: Once | INTRAVENOUS | Status: AC | PRN
  Administered 2023-01-14: 75 mL via INTRAVENOUS

## 2023-01-20 ENCOUNTER — Ambulatory Visit (INDEPENDENT_AMBULATORY_CARE_PROVIDER_SITE_OTHER): Payer: Medicare Other | Admitting: Podiatry

## 2023-01-20 DIAGNOSIS — M1A3720 Chronic gout due to renal impairment, left ankle and foot, without tophus (tophi): Secondary | ICD-10-CM

## 2023-01-20 NOTE — Progress Notes (Signed)
Subjective:  Patient ID: Christine Steele, female    DOB: 1951-03-27,  MRN: [id number]  Chief Complaint  Patient presents with   Arthritis    Acute gout flare vs arthritis of right foot and ankle, pain and swelling. Efforts to limit wb    72 y.o. female presents with the above complaint. History confirmed with patient.  She recently took a steroid taper that I prescribed, was doing better after this until she had a CT scan with contrast and it got worse after this  Objective:  Physical Exam: warm, good capillary refill, no trophic changes or ulcerative lesions, normal DP and PT pulses, and normal sensory exam. Left Foot: Pain and swelling around left midfoot and ankle, nonspecific and diffuse Right Foot: No pain to palpation today, Assessment:   1. 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  Seems to have a new gout flare likely secondary to renal impairment from contrast media.  I recommended rechecking her uric acid and metabolic panel and she was given orders for this.  Pending results will prescribe colchicine or methylprednisolone.  I also recommend a referral to rheumatology for chronic management.  Referral to Dr. Deanne Coffer was sent   No follow-ups on file.

## 2023-01-21 LAB — BASIC METABOLIC PANEL
BUN/Creatinine Ratio: 11 — ABNORMAL LOW (ref 12–28)
BUN: 11 mg/dL (ref 8–27)
CO2: 23 mmol/L (ref 20–29)
Calcium: 9.6 mg/dL (ref 8.7–10.3)
Chloride: 101 mmol/L (ref 96–106)
Creatinine, Ser: 1.01 mg/dL — ABNORMAL HIGH (ref 0.57–1.00)
Glucose: 82 mg/dL (ref 70–99)
Potassium: 3.6 mmol/L (ref 3.5–5.2)
Sodium: 142 mmol/L (ref 134–144)
eGFR: 60 mL/min/{1.73_m2} (ref >59)

## 2023-01-21 LAB — URIC ACID: Uric Acid: 7.3 mg/dL (ref 3.1–7.9)

## 2023-01-21 MED ORDER — COLCHICINE 0.6 MG PO TABS
ORAL_TABLET | ORAL | 2 refills | Status: AC
Start: 2023-01-21 — End: ?

## 2023-01-21 NOTE — Addendum Note (Signed)
Addended byLilian Kapur, Zaynab Chipman R on: 01/21/2023 08:18 AM   Modules accepted: Orders

## 2023-01-26 ENCOUNTER — Telehealth: Payer: Self-pay

## 2023-01-26 NOTE — Telephone Encounter (Signed)
Referral, office note, and demographics faxed to Dr. Deanne Coffer @ [facility]. Phone [phone], fax [phone]    Confirmation received

## 2023-02-10 ENCOUNTER — Telehealth: Payer: Self-pay | Admitting: Cardiovascular Disease

## 2023-02-10 DIAGNOSIS — I25118 Atherosclerotic heart disease of native coronary artery with other forms of angina pectoris: Secondary | ICD-10-CM

## 2023-02-10 DIAGNOSIS — E7800 Pure hypercholesterolemia, unspecified: Secondary | ICD-10-CM

## 2023-02-10 DIAGNOSIS — G4733 Obstructive sleep apnea (adult) (pediatric): Secondary | ICD-10-CM

## 2023-02-10 DIAGNOSIS — I10 Essential (primary) hypertension: Secondary | ICD-10-CM

## 2023-02-10 NOTE — Telephone Encounter (Signed)
Pt called in asking to speak with Brandie, CMA about getting new CPAP supplies. She states her cpap is starting to smell and she isn't sure where she is supposed to get supplies from. Please advise.

## 2023-02-12 DIAGNOSIS — M79671 Pain in right foot: Secondary | ICD-10-CM | POA: Diagnosis not present

## 2023-02-12 DIAGNOSIS — N1831 Chronic kidney disease, stage 3a: Secondary | ICD-10-CM | POA: Diagnosis not present

## 2023-02-12 DIAGNOSIS — M79642 Pain in left hand: Secondary | ICD-10-CM | POA: Diagnosis not present

## 2023-02-12 DIAGNOSIS — M2550 Pain in unspecified joint: Secondary | ICD-10-CM | POA: Diagnosis not present

## 2023-02-12 DIAGNOSIS — M25572 Pain in left ankle and joints of left foot: Secondary | ICD-10-CM | POA: Diagnosis not present

## 2023-02-12 DIAGNOSIS — M79641 Pain in right hand: Secondary | ICD-10-CM | POA: Diagnosis not present

## 2023-02-12 DIAGNOSIS — M79673 Pain in unspecified foot: Secondary | ICD-10-CM | POA: Diagnosis not present

## 2023-02-12 DIAGNOSIS — M109 Gout, unspecified: Secondary | ICD-10-CM | POA: Diagnosis not present

## 2023-02-12 DIAGNOSIS — M79672 Pain in left foot: Secondary | ICD-10-CM | POA: Diagnosis not present

## 2023-02-12 DIAGNOSIS — M25472 Effusion, left ankle: Secondary | ICD-10-CM | POA: Diagnosis not present

## 2023-02-19 NOTE — Telephone Encounter (Signed)
Choice transfer.  Supplies order sent to [facility].  Upon patient request DME selection is [facility] Patient understands he will be contacted by [facility] to set up his cpap. Patient understands to call if [facility] does not contact him with new setup in a timely manner. Patient understands they will be called once confirmation has been received from [facility] that they have received their new machine to schedule 10 week follow up appointment.   [facility] notified of new cpap order  Please add to airview Patient was grateful for the call and thanked me.

## 2023-03-05 DIAGNOSIS — E7800 Pure hypercholesterolemia, unspecified: Secondary | ICD-10-CM | POA: Diagnosis not present

## 2023-03-05 DIAGNOSIS — E559 Vitamin D deficiency, unspecified: Secondary | ICD-10-CM | POA: Diagnosis not present

## 2023-03-11 DIAGNOSIS — D72819 Decreased white blood cell count, unspecified: Secondary | ICD-10-CM | POA: Diagnosis not present

## 2023-03-11 DIAGNOSIS — R768 Other specified abnormal immunological findings in serum: Secondary | ICD-10-CM | POA: Diagnosis not present

## 2023-03-11 DIAGNOSIS — N1831 Chronic kidney disease, stage 3a: Secondary | ICD-10-CM | POA: Diagnosis not present

## 2023-03-11 DIAGNOSIS — Z79899 Other long term (current) drug therapy: Secondary | ICD-10-CM | POA: Diagnosis not present

## 2023-03-11 DIAGNOSIS — M109 Gout, unspecified: Secondary | ICD-10-CM | POA: Diagnosis not present

## 2023-03-12 DIAGNOSIS — I10 Essential (primary) hypertension: Secondary | ICD-10-CM | POA: Diagnosis not present

## 2023-03-12 DIAGNOSIS — E7800 Pure hypercholesterolemia, unspecified: Secondary | ICD-10-CM | POA: Diagnosis not present

## 2023-03-12 DIAGNOSIS — D72819 Decreased white blood cell count, unspecified: Secondary | ICD-10-CM | POA: Diagnosis not present

## 2023-03-12 DIAGNOSIS — M109 Gout, unspecified: Secondary | ICD-10-CM | POA: Diagnosis not present

## 2023-03-12 DIAGNOSIS — I2510 Atherosclerotic heart disease of native coronary artery without angina pectoris: Secondary | ICD-10-CM | POA: Diagnosis not present

## 2023-03-12 DIAGNOSIS — E559 Vitamin D deficiency, unspecified: Secondary | ICD-10-CM | POA: Diagnosis not present

## 2023-04-18 ENCOUNTER — Other Ambulatory Visit (HOSPITAL_BASED_OUTPATIENT_CLINIC_OR_DEPARTMENT_OTHER): Payer: Self-pay | Admitting: Family

## 2023-04-18 DIAGNOSIS — K219 Gastro-esophageal reflux disease without esophagitis: Secondary | ICD-10-CM

## 2023-09-02 ENCOUNTER — Telehealth: Payer: Self-pay | Admitting: Cardiovascular Disease

## 2023-09-02 NOTE — Telephone Encounter (Signed)
 Pt requesting cb regarding cpap machine not working

## 2023-09-07 NOTE — Telephone Encounter (Signed)
 Reached out to patient to advise her to call her dme. Patient states she called her dme and she only needed to push her cords in a little more and now her machine works.

## 2023-09-09 DIAGNOSIS — E7800 Pure hypercholesterolemia, unspecified: Secondary | ICD-10-CM | POA: Diagnosis not present

## 2023-09-09 DIAGNOSIS — E559 Vitamin D deficiency, unspecified: Secondary | ICD-10-CM | POA: Diagnosis not present

## 2023-09-09 DIAGNOSIS — I10 Essential (primary) hypertension: Secondary | ICD-10-CM | POA: Diagnosis not present

## 2023-09-09 DIAGNOSIS — Z0000 Encounter for general adult medical examination without abnormal findings: Secondary | ICD-10-CM | POA: Diagnosis not present

## 2023-09-11 DIAGNOSIS — R748 Abnormal levels of other serum enzymes: Secondary | ICD-10-CM | POA: Diagnosis not present

## 2023-09-11 DIAGNOSIS — K219 Gastro-esophageal reflux disease without esophagitis: Secondary | ICD-10-CM | POA: Diagnosis not present

## 2023-09-11 DIAGNOSIS — N6311 Unspecified lump in the right breast, upper outer quadrant: Secondary | ICD-10-CM | POA: Diagnosis not present

## 2023-09-11 DIAGNOSIS — I2510 Atherosclerotic heart disease of native coronary artery without angina pectoris: Secondary | ICD-10-CM | POA: Diagnosis not present

## 2023-09-11 DIAGNOSIS — E7800 Pure hypercholesterolemia, unspecified: Secondary | ICD-10-CM | POA: Diagnosis not present

## 2023-09-11 DIAGNOSIS — Z0000 Encounter for general adult medical examination without abnormal findings: Secondary | ICD-10-CM | POA: Diagnosis not present

## 2023-09-11 DIAGNOSIS — I10 Essential (primary) hypertension: Secondary | ICD-10-CM | POA: Diagnosis not present

## 2023-09-15 ENCOUNTER — Other Ambulatory Visit: Payer: Self-pay | Admitting: Registered Nurse

## 2023-09-15 DIAGNOSIS — N63 Unspecified lump in unspecified breast: Secondary | ICD-10-CM

## 2023-09-23 ENCOUNTER — Other Ambulatory Visit (HOSPITAL_COMMUNITY)
Admission: RE | Admit: 2023-09-23 | Discharge: 2023-09-23 | Disposition: A | Discharge status: Home / Self Care | Source: Ambulatory Visit | Attending: Obstetrics and Gynecology | Admitting: Obstetrics and Gynecology

## 2023-09-23 ENCOUNTER — Ambulatory Visit (INDEPENDENT_AMBULATORY_CARE_PROVIDER_SITE_OTHER): Admitting: Obstetrics and Gynecology

## 2023-09-23 ENCOUNTER — Encounter: Payer: Self-pay | Admitting: Obstetrics and Gynecology

## 2023-09-23 VITALS — BP 138/78 | HR 57 | Wt 149.0 lb

## 2023-09-23 DIAGNOSIS — N9089 Other specified noninflammatory disorders of vulva and perineum: Secondary | ICD-10-CM

## 2023-09-23 DIAGNOSIS — Z124 Encounter for screening for malignant neoplasm of cervix: Secondary | ICD-10-CM | POA: Insufficient documentation

## 2023-09-23 DIAGNOSIS — N6310 Unspecified lump in the right breast, unspecified quadrant: Secondary | ICD-10-CM

## 2023-09-23 DIAGNOSIS — N950 Postmenopausal bleeding: Secondary | ICD-10-CM

## 2023-09-23 LAB — PREGNANCY, URINE: Preg Test, Ur: NEGATIVE

## 2023-09-23 NOTE — Patient Instructions (Addendum)
 Take ibuprofen 400-600mg  30 mins before your next appointment.

## 2023-09-23 NOTE — Progress Notes (Signed)
 73 y.o. [id number] female here for multiple complaints. Married.  She reports lump in right breast x 2 weeks, painful to touch. No nipple discharge. Dx BL MMG and US  scheduled 09/29/23, ordered by PCP.  She also reports abnormal cramping and vaginal spotting noticed 1 week ago. No dysuria. No bleeding since last week, however she is still having mild cramping. Of note, she had a diagnostic hysteroscopy in 2021 which revealed a benign endometrial polyp.  She noted genital warts in vaginal area ~2wk ago. She did not see before.  Last mammogram: 08/28/20 [facility] 1  GYN HISTORY: Prior right lumpectomy  OB History  Gravida Para Term Preterm AB Living  1 0   1   SAB IAB Ectopic Multiple Live Births  1        # Outcome Date GA Lbr Len/2nd Weight Sex Type Anes PTL Lv  1 SAB            Past Medical History:  Diagnosis Date   Aneurysm (HCC)    2mm Right MCA   Arthritis    Cataract    Bilateral   Chronic kidney disease    Dr. Schuyler Custard (PCP)    Hypertension    OSA on CPAP    havent used CPAP in long time per patient due to unable to get filters to change out   Osteopenia 01/2017   T score -1.4 FRAX 3.4% / 0.4%   Tachycardia    Vitamin D deficiency    Past Surgical History:  Procedure Laterality Date   BIOPSY BREAST  1970   Right Breast   COLONOSCOPY     DILATATION & CURETTAGE/HYSTEROSCOPY WITH MYOSURE N/A 03/06/2017   Procedure: DILATATION & CURETTAGE/HYSTEROSCOPY WITH MYOSURE;  Surgeon: Lavoie, Marie-Lyne, MD;  Location: [facility];  Service: Gynecology;  Laterality: N/A;  Request 7:30am in [place] [facility] block time  requests one hour   DILATATION & CURETTAGE/HYSTEROSCOPY WITH MYOSURE N/A 10/03/2019   Procedure: DILATATION & CURETTAGE/HYSTEROSCOPY WITH MYOSURE;  Surgeon: Lavoie, Marie-Lyne, MD;  Location: [facility] [place];  Service: Gynecology;  Laterality: N/A;  request 8:30am OR time in [place] [facility] block requests 45 minutes   right lumpectomy  1970    benign   Current Outpatient Medications on File Prior to Visit  Medication Sig Dispense Refill   Ascorbic Acid (VITAMIN C PO) Take 1 tablet by mouth daily.     atenolol  (TENORMIN ) 25 MG tablet Take 1 tablet (25 mg total) by mouth daily. 90 tablet 3   Calcium  Carbonate-Vit D-Min (CALCIUM  1200 PO) Take 1 tablet by mouth daily.     Cholecalciferol (VITAMIN D3) 250 MCG (10000 UT) capsule Take 10,000 Units by mouth once a week.     colchicine  0.6 MG tablet Take 1.2mg  (2 tablets) then 0.6mg  (1 tablet) 1 hour after. Then, take 1 tablet every day for 7 days. 10 tablet 2   irbesartan  (AVAPRO ) 150 MG tablet Take 1 tablet (150 mg total) by mouth daily. 90 tablet 3   Multiple Vitamin (MULTIVITAMIN WITH MINERALS) TABS tablet Take 1 tablet by mouth daily.     rosuvastatin  (CRESTOR ) 10 MG tablet TAKE 1 TABLET BY MOUTH DAILY AT 6 PM. 90 tablet 2   No current facility-administered medications on file prior to visit.   Allergies  Allergen Reactions   Amlodipine Swelling and Other (See Comments)    Leg/feet swelling.   Chocolate Other (See Comments)    Headache   Chocolate Flavoring Agent (Non-Screening)  Other reaction(s): headaches   Coconut Fatty Acid Other (See Comments)   Hydralazine  Other (See Comments)    ? Fast heart rate   Hydralazine  Hcl     Other reaction(s): heart rate increase   Other Other (See Comments)    MSG causes headaches   Peanut-Containing Drug Products Other (See Comments)    All peanuts; headache      PE Today's Vitals   09/23/23 1212  BP: 138/78  Pulse: (!) 57  SpO2: 99%  Weight: 149 lb (67.6 kg)   Body mass index is 26.39 kg/m.  Physical Exam Vitals reviewed. Exam conducted with a chaperone present.  Constitutional:      General: She is not in acute distress.    Appearance: Normal appearance.  HENT:     Head: Normocephalic and atraumatic.     Nose: Nose normal.  Eyes:     Extraocular Movements: Extraocular movements intact.     Conjunctiva/sclera:  Conjunctivae normal.  Pulmonary:     Effort: Pulmonary effort is normal.  Chest:  Breasts:    Right: Normal. No mass, nipple discharge, skin change or tenderness.     Left: Normal. No mass, nipple discharge, skin change or tenderness.    Genitourinary:    General: Normal vulva.     Exam position: Lithotomy position.     Vagina: Normal. No vaginal discharge.     Cervix: Normal. No cervical motion tenderness, discharge or lesion.     Uterus: Normal. Not enlarged and not tender.      Adnexa: Right adnexa normal and left adnexa normal.       Comments: Warty like growth under clitoral hood. Musculoskeletal:        General: Normal range of motion.     Cervical back: Normal range of motion.  Lymphadenopathy:     Upper Body:     Right upper body: No axillary adenopathy.     Left upper body: No axillary adenopathy.  Neurological:     General: No focal deficit present.     Mental Status: She is alert.  Psychiatric:        Mood and Affect: Mood normal.        Behavior: Behavior normal.       Assessment and Plan:        Mass of right breast, unspecified quadrant Agree with diagnostic imaging ordered by PCP  PMB (postmenopausal bleeding) -     US  PELVIS TRANSVAGINAL NON-OB (TV ONLY); Future -     Pregnancy, urine -     Endometrial biopsy; Future Return to office for transvaginal ultrasound and possible endometrial biopsy pending endometrial thickness. History of endometrial polyps in 2021, status post diagnostic hysteroscopy polypectomy  Cervical cancer screening -     Cytology - PAP  Vulvar lesion -     Biopsy vulva; Future Plan for vulvar biopsy of clitoral lesion   Romaine Closs, MD

## 2023-09-24 ENCOUNTER — Ambulatory Visit: Payer: Self-pay | Admitting: Obstetrics and Gynecology

## 2023-09-25 LAB — CYTOLOGY - PAP: Diagnosis: NEGATIVE

## 2023-09-29 ENCOUNTER — Ambulatory Visit
Admission: RE | Admit: 2023-09-29 | Discharge: 2023-09-29 | Disposition: A | Discharge status: Home / Self Care | Source: Ambulatory Visit | Attending: Registered Nurse | Admitting: Registered Nurse

## 2023-09-29 DIAGNOSIS — R928 Other abnormal and inconclusive findings on diagnostic imaging of breast: Secondary | ICD-10-CM | POA: Diagnosis not present

## 2023-09-29 DIAGNOSIS — N63 Unspecified lump in unspecified breast: Secondary | ICD-10-CM

## 2023-10-03 ENCOUNTER — Other Ambulatory Visit (HOSPITAL_BASED_OUTPATIENT_CLINIC_OR_DEPARTMENT_OTHER): Payer: Self-pay | Admitting: Cardiovascular Disease

## 2023-10-03 DIAGNOSIS — I10 Essential (primary) hypertension: Secondary | ICD-10-CM

## 2023-10-15 ENCOUNTER — Ambulatory Visit: Admitting: Obstetrics and Gynecology

## 2023-10-15 ENCOUNTER — Ambulatory Visit

## 2023-10-15 VITALS — BP 138/64 | HR 56 | Temp 97.6°F | Wt 148.0 lb

## 2023-10-15 DIAGNOSIS — N83202 Unspecified ovarian cyst, left side: Secondary | ICD-10-CM

## 2023-10-15 DIAGNOSIS — N9089 Other specified noninflammatory disorders of vulva and perineum: Secondary | ICD-10-CM | POA: Diagnosis not present

## 2023-10-15 DIAGNOSIS — N950 Postmenopausal bleeding: Secondary | ICD-10-CM

## 2023-10-15 DIAGNOSIS — D3912 Neoplasm of uncertain behavior of left ovary: Secondary | ICD-10-CM

## 2023-10-15 DIAGNOSIS — R9389 Abnormal findings on diagnostic imaging of other specified body structures: Secondary | ICD-10-CM | POA: Diagnosis not present

## 2023-10-15 NOTE — Progress Notes (Signed)
 73 y.o. G10P0010 female here for endometrial and vulvar biopsy. Married.  She presents with her husband.  At 09/23/2023 appointment: She reports lump in right breast x 2 weeks, painful to touch. No nipple discharge. Dx BL MMG and US  scheduled 09/29/23, ordered by PCP.  She also reports abnormal cramping and vaginal spotting noticed 1 week ago. No dysuria. No bleeding since last week, however she is still having mild cramping. Of note, she had a diagnostic hysteroscopy in 2021 which revealed a benign endometrial polyp.  She noted genital warts in vaginal area ~2wk ago. She did not see before.  Today, she is doing well.  She denies CP or SOB.  Last mammogram: 09/29/2023 BIRADS 1, density C  GYN HISTORY: Prior right lumpectomy  OB History  Gravida Para Term Preterm AB Living  1 0   1   SAB IAB Ectopic Multiple Live Births  1        # Outcome Date GA Lbr Len/2nd Weight Sex Type Anes PTL Lv  1 SAB            Past Medical History:  Diagnosis Date   Aneurysm (HCC)    2mm Right MCA   Arthritis    Cataract    Bilateral   Chronic kidney disease    Dr. Clarice (PCP)    Hypertension    OSA on CPAP    havent used CPAP in long time per patient due to unable to get filters to change out   Osteopenia 01/2017   T score -1.4 FRAX 3.4% / 0.4%   Tachycardia    Vitamin D deficiency    Past Surgical History:  Procedure Laterality Date   BIOPSY BREAST  1970   Right Breast   COLONOSCOPY     DILATATION & CURETTAGE/HYSTEROSCOPY WITH MYOSURE N/A 03/06/2017   Procedure: DILATATION & CURETTAGE/HYSTEROSCOPY WITH MYOSURE;  Surgeon: Lavoie, Marie-Lyne, MD;  Location: [facility];  Service: Gynecology;  Laterality: N/A;  Request 7:30am in [place] [facility] block time  requests one hour   DILATATION & CURETTAGE/HYSTEROSCOPY WITH MYOSURE N/A 10/03/2019   Procedure: DILATATION & CURETTAGE/HYSTEROSCOPY WITH MYOSURE;  Surgeon: Lavoie, Marie-Lyne, MD;  Location: [facility] [place];   Service: Gynecology;  Laterality: N/A;  request 8:30am OR time in [place] [facility] block requests 45 minutes   right lumpectomy  1970   benign   Current Outpatient Medications on File Prior to Visit  Medication Sig Dispense Refill   Ascorbic Acid (VITAMIN C PO) Take 1 tablet by mouth daily.     atenolol  (TENORMIN ) 25 MG tablet Take 1 tablet (25 mg total) by mouth daily. Please call [phone] to schedule an appointment for future refills. Thank you. 1st attempt. 30 tablet 0   Calcium  Carbonate-Vit D-Min (CALCIUM  1200 PO) Take 1 tablet by mouth daily.     Cholecalciferol (VITAMIN D3) 250 MCG (10000 UT) capsule Take 10,000 Units by mouth once a week.     colchicine  0.6 MG tablet Take 1.2mg  (2 tablets) then 0.6mg  (1 tablet) 1 hour after. Then, take 1 tablet every day for 7 days. 10 tablet 2   irbesartan  (AVAPRO ) 150 MG tablet Take 1 tablet (150 mg total) by mouth daily. 90 tablet 3   Multiple Vitamin (MULTIVITAMIN WITH MINERALS) TABS tablet Take 1 tablet by mouth daily.     rosuvastatin  (CRESTOR ) 10 MG tablet TAKE 1 TABLET BY MOUTH DAILY AT 6 PM. 90 tablet 2   No current facility-administered medications on file prior to visit.  Allergies  Allergen Reactions   Amlodipine Swelling and Other (See Comments)    Leg/feet swelling.   Chocolate Other (See Comments)    Headache   Chocolate Flavoring Agent (Non-Screening)     Other reaction(s): headaches   Coconut Fatty Acid Other (See Comments)   Hydralazine  Other (See Comments)    ? Fast heart rate   Hydralazine  Hcl     Other reaction(s): heart rate increase   Other Other (See Comments)    MSG causes headaches   Peanut-Containing Drug Products Other (See Comments)    All peanuts; headache      PE Today's Vitals   10/15/23 0945  BP: 138/64  Pulse: (!) 56  Temp: 97.6 F (36.4 C)  TempSrc: Oral  SpO2: 99%  Weight: 148 lb (67.1 kg)   Body mass index is 26.22 kg/m.  Physical Exam Vitals reviewed. Exam conducted with a chaperone  present.  Constitutional:      General: She is not in acute distress.    Appearance: Normal appearance.  HENT:     Head: Normocephalic and atraumatic.     Nose: Nose normal.  Eyes:     Extraocular Movements: Extraocular movements intact.     Conjunctiva/sclera: Conjunctivae normal.  Cardiovascular:     Rate and Rhythm: Normal rate and regular rhythm.     Heart sounds: No murmur heard.    No friction rub. No gallop.  Pulmonary:     Effort: Pulmonary effort is normal. No respiratory distress.     Breath sounds: Normal breath sounds. No stridor. No wheezing, rhonchi or rales.  Genitourinary:    General: Normal vulva.     Exam position: Lithotomy position.     Vagina: Normal. No vaginal discharge.     Cervix: Normal. No cervical motion tenderness, discharge or lesion.     Uterus: Normal. Not enlarged and not tender.      Adnexa: Right adnexa normal and left adnexa normal.      Comments: Warty like growth under clitoral hood. Musculoskeletal:        General: Normal range of motion.     Cervical back: Normal range of motion.  Lymphadenopathy:     Upper Body:     Right upper body: No axillary adenopathy.     Left upper body: No axillary adenopathy.  Neurological:     General: No focal deficit present.     Mental Status: She is alert.  Psychiatric:        Mood and Affect: Mood normal.        Behavior: Behavior normal.     10/15/23 TVUS: Indications: Postmenopausal bleeding, known fibroids, chronic simple left ovarian cyst. Comparison: 08/04/2019  Findings:   Uterus: 7.9 x 5.6 x 7.3 cm, multifibroid uterus Fibroids: 6 fibroids measured, the largest 4.0 x 3.3 cm.  Fibroids ranged from 1 cm to 4 cm. Endometrial thickness: 10 mm.  Cystic areas noted in vascularity, concerning for endometrial polyp. Left ovary: 6.3 x 5.7 x 5.3 cm.  5.7 cm simple avascular cyst noted. Right ovary: 2.3 x 1.5 x 0.9 cm, normal-appearing. No free fluid.  Impression:  Stable uterine fibroids when  compared to 2021 TVUS. Thickened endometrium concerning for endometrial polyps, also previously noted on 2021 TVUS.  However, patient has had surgical intervention since. Enlarging left simple, avascular cyst of the ovary, when compared to 2021 TVUS.  Vera LULLA Pa, MD   Indication for biopsy: clitoral wart  The indications for vulvar biopsy were reviewed. Risks of  the biopsy including pain, bleeding, infection, inadequate specimen, and need for additional procedures were discussed. The patient stated understanding and agreed to undergo procedure today. Consent was signed, and an adequate time out performed.   The patient's vulva was prepped with Betadine. 1% lidocaine  was injected into the clitoral hood. Patient asked to terminate the procedure at this time. Would like to do under anesthesia.   Assessment and Plan:        PMB (postmenopausal bleeding) Thickened endometrium Assessment & Plan: Reviewed causes of PMB, including genitourinary syndrome of menopause, infection, trauma, polyps, urinary and GI etiologies, and malignancy.   TVUS reviewed with patient which reveals thickened endometrium suggestive of endometrial polyp.  Discussed options of endometrial biopsy versus diagnostic hysteroscopy with sedation. Patient elects for hysteroscopy.  Plan for hysteroscopy, dilation and curettage, polypectomy. Will also plan for excision of vulvar lesion.  Discussed outpatient procedure. Reviewed that  recovery is usually 1-2 days. Risks including infections, bleeding, and damage to surrounding organs reviewed. Recommend NPO prior to midnight and reviewed medication to take on day of surgery. Dicussed use of NSAIDS as needed for pain postoperatively.  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  Now 5.7 cm. Recommend CA125 and ultrasound surveillance. TVUS in 3 months Reviewed that given size of the ovary there is a risk for torsion and rupture. ED precautions provided.  Orders: -     CA 125 -     US  PELVIS TRANSVAGINAL NON-OB (TV ONLY); Future   Vulvar lesion Assessment & Plan: Clitoral wart versus skin tag present Patient desires excision. Attempted excision in office however patient unable to tolerate local anesthesia. Desires excision under anesthesia, see above.       Vera LULLA Pa, MD

## 2023-10-15 NOTE — Patient Instructions (Addendum)
 Preoperative instructions: Nothing to eat or drink after midnight, unless instructed differently regarding clear liquids by the anesthesia team at [facility] health. Do not take any medications on the day of surgery, except those listed below: Atenolol  Irbesartan  Please follow all other instructions as provided by our surgical scheduler at [facility] and the anesthesia team at [facility] health.  Postoperative instructions: Clean your incision daily with mild soapy water.  Sitz bath are helpful for wound healing.   Ensure that you thoroughly dry your wound following cleaning and keep dry throughout the day.  You can apply a thin layer of Aquaphor or Vaseline over your incision. Ice packs can be applied to your incision for up to 20 minutes at a time to help with pain and swelling. Take ibuprofen as prescribed and over-the-counter Tylenol  as needed.

## 2023-10-16 LAB — CA 125: CA 125: 9 U/mL (ref <35)

## 2023-10-19 ENCOUNTER — Ambulatory Visit: Payer: Self-pay | Admitting: Obstetrics and Gynecology

## 2023-10-22 ENCOUNTER — Telehealth: Payer: Self-pay

## 2023-10-22 DIAGNOSIS — D219 Benign neoplasm of connective and other soft tissue, unspecified: Secondary | ICD-10-CM | POA: Diagnosis not present

## 2023-10-22 DIAGNOSIS — R102 Pelvic and perineal pain: Secondary | ICD-10-CM | POA: Diagnosis not present

## 2023-10-22 DIAGNOSIS — R1032 Left lower quadrant pain: Secondary | ICD-10-CM | POA: Diagnosis not present

## 2023-10-22 NOTE — Telephone Encounter (Signed)
 Patient called & stated she had something in her mychart that said she needed to have an endometrial biopsy done by 10-23-23. Patient states she is confused & not sure if she was suppose to have something done by now.  Routing to Dr. [name]

## 2023-10-27 NOTE — Telephone Encounter (Signed)
 Patient notified

## 2023-10-29 ENCOUNTER — Telehealth: Payer: Self-pay | Admitting: *Deleted

## 2023-10-29 NOTE — Telephone Encounter (Signed)
   Pre-operative Risk Assessment    Patient Name: Christine Steele  DOB: 1950-07-20 MRN: [id number]   Date of last office visit: 09/23/22 DR. Omao Date of next office visit: 02/02/24 DR. Vining   Request for Surgical Clearance    Procedure:  DIAGNOSTIC HYSTEROSCOPY, D&C, POLYPECTOMY, EXCISION OF VULVAR LESION  Date of Surgery:  Clearance TBD                                Surgeon:  DR. [name] Socks Group or Practice Name:  [facility] Phone number:  [phone] Fax number:  [phone]   Type of Clearance Requested:   - Medical ; NONE INDICATED TO BE HELD PER FORM   Type of Anesthesia:  Local  WITH STANDBY ANESTHESIA   Additional requests/questions:    Bonney Niels Jest   10/29/2023, 8:56 AM

## 2023-10-29 NOTE — Telephone Encounter (Signed)
 Left message for pt to call our office and schedule IN OFFICE Preop appt.

## 2023-10-29 NOTE — Telephone Encounter (Signed)
 Pt scheduled for OV on 11/20/23.

## 2023-10-29 NOTE — Telephone Encounter (Signed)
   Name: MURREL FREET  DOB: 1951/04/12  MRN: [id number]  Primary Cardiologist: Annabella Scarce, MD  Chart reviewed as part of pre-operative protocol coverage. Because of MAYLEN WALTERMIRE past medical history and time since last visit, she will require a follow-up in-office visit in order to better assess preoperative cardiovascular risk.  Pre-op covering staff: - Please schedule appointment and call patient to inform them. If patient already had an upcoming appointment within acceptable timeframe, please add pre-op clearance to the appointment notes so provider is aware. - Please contact requesting surgeon's office via preferred method (i.e, phone, fax) to inform them of need for appointment prior to surgery.  Darek Eifler D Crystalle Popwell, NP  10/29/2023, 12:41 PM

## 2023-10-30 NOTE — Assessment & Plan Note (Signed)
 Reviewed causes of PMB, including genitourinary syndrome of menopause, infection, trauma, polyps, urinary and GI etiologies, and malignancy.   TVUS reviewed with patient which reveals thickened endometrium suggestive of endometrial polyp.  Discussed options of endometrial biopsy versus diagnostic hysteroscopy with sedation. Patient elects for hysteroscopy.  Plan for hysteroscopy, dilation and curettage, polypectomy. Will also plan for excision of vulvar lesion.  Discussed outpatient procedure. Reviewed that  recovery is usually 1-2 days. Risks including infections, bleeding, and damage to surrounding organs reviewed. Recommend NPO prior to midnight and reviewed medication to take on day of surgery. Dicussed use of NSAIDS as needed for pain postoperatively.  Preop checklist: Antibiotics: none DVT ppx: SCDs Postop visit: 1 week Additional clearance: PCP and cardiology

## 2023-10-30 NOTE — Assessment & Plan Note (Signed)
 Clitoral wart versus skin tag present Patient desires excision. Attempted excision in office however patient unable to tolerate local anesthesia. Desires excision under anesthesia, see above.

## 2023-10-30 NOTE — Assessment & Plan Note (Addendum)
 TVUS revealed enlarging left simple, avascular cyst of the ovary, when compared to 2021 TVUS.  Now 5.7 cm. Recommend CA125 and ultrasound surveillance. TVUS in 3 months Reviewed that given size of the ovary there is a risk for torsion and rupture. ED precautions provided.

## 2023-11-02 ENCOUNTER — Other Ambulatory Visit: Payer: Self-pay

## 2023-11-02 DIAGNOSIS — I10 Essential (primary) hypertension: Secondary | ICD-10-CM

## 2023-11-02 MED ORDER — ATENOLOL 25 MG PO TABS: 25.0000 mg | ORAL_TABLET | Freq: Every day | ORAL | 0 refills | Status: DC

## 2023-11-20 ENCOUNTER — Encounter (HOSPITAL_BASED_OUTPATIENT_CLINIC_OR_DEPARTMENT_OTHER): Payer: Self-pay

## 2023-11-20 ENCOUNTER — Ambulatory Visit (HOSPITAL_BASED_OUTPATIENT_CLINIC_OR_DEPARTMENT_OTHER): Admitting: Family

## 2023-11-20 ENCOUNTER — Encounter (HOSPITAL_BASED_OUTPATIENT_CLINIC_OR_DEPARTMENT_OTHER): Payer: Self-pay | Admitting: Family

## 2023-11-20 VITALS — BP 138/78 | HR 60 | Ht 63.0 in | Wt 152.0 lb

## 2023-11-20 DIAGNOSIS — E785 Hyperlipidemia, unspecified: Secondary | ICD-10-CM

## 2023-11-20 DIAGNOSIS — Z01810 Encounter for preprocedural cardiovascular examination: Secondary | ICD-10-CM

## 2023-11-20 DIAGNOSIS — I25118 Atherosclerotic heart disease of native coronary artery with other forms of angina pectoris: Secondary | ICD-10-CM | POA: Diagnosis not present

## 2023-11-20 DIAGNOSIS — G4733 Obstructive sleep apnea (adult) (pediatric): Secondary | ICD-10-CM

## 2023-11-20 DIAGNOSIS — I10 Essential (primary) hypertension: Secondary | ICD-10-CM | POA: Diagnosis not present

## 2023-11-20 NOTE — Progress Notes (Signed)
 Cardiology Office Note   Date:  11/20/2023  ID:  [name], [name] 1951/03/11, MRN [id number] PCP: Clarice Nottingham, MD   HeartCare Providers Cardiologist:  Annabella Scarce, MD     History of Present Illness Christine Steele is a 73 y.o. female with a hx of moderate coronary disease, brain aneurysm, hypertension, OSA on CPAP.   Seen [id number] for chest discomfort with myoview  03/2018 negative for ischemia. Due to continued atypical symptoms cardiac CTA performed 06/2019 with moderate LAD disease and calcium  score 98th percentile.    Prior adjustments to antihypertensive regimen include discontinuation of HCTZ due to weakness and gout. Transition from Losartan to Irbesartan . Hydralazine  added 08/2019 due to uncontrolled BP but did not tolerate due to tachycardia and Atenolol  instead increased. She completed the PREP program at the [facility] in 2021.   At visit 11/2022 she noted a difficult summer as her elderly mother who she took care of had passed away. Bp was controlled in clinic. Since that time Irbesartan  has been increased to 300mg  daily by PCP team.  Presents today for preop clearance of diagnostic hysterectomy, D&C, polypectomy, excision of vulvar lesion. No exertional chest pain nor dyspnea. Occasional chest pain which attributes with muscle and improves with moving her bra. Has not been checking BP recently. She takes her Irbesartan  in the morning and Atenolol  at night.   Previous antihypertensives Amldoipine - LE edema Hydralazine  - tachycardia  ROS: Please see the history of present illness.    All other systems reviewed and are negative.   Studies Reviewed EKG Interpretation Date/Time:  Friday November 20 2023 14:19:11 EDT Ventricular Rate:  60 PR Interval:  144 QRS Duration:  76 QT Interval:  412 QTC Calculation: 412 R Axis:   9  Text Interpretation: Normal sinus rhythm Normal ECG TWI lead III which is stable from previous. Confirmed by [name] Mora ([id number]) on 11/20/2023  2:24:52 PM    Cardiac Studies & Procedures   ______________________________________________________________________________________________   STRESS TESTS  MYOCARDIAL PERFUSION IMAGING 04/02/2018  Interpretation Summary  Nuclear stress EF: 52%. The left ventricular ejection fraction is mildly decreased (45-54%).  T wave inversion was noted during stress in the V4, V5, aVF and V3 leads.  This is a low risk study. No evidence of ischemia. No previous MI  The study is normal.   ECHOCARDIOGRAM  ECHOCARDIOGRAM COMPLETE 11/14/2009      CT SCANS  CT CORONARY FRACTIONAL FLOW RESERVE DATA PREP 07/11/2019  Narrative CLINICAL DATA:  Chest pain  EXAM: CT FFR  MEDICATIONS: No additional medications.  TECHNIQUE: The coronary CTA was sent for FFR.  FINDINGS: Unable to interpret study due to extent of motion artifact.  Dalton Mclean   Electronically Signed By: Ezra Shuck M.D. On: 07/11/2019 17:26   CT SCANS  CT CORONARY MORPH W/CTA COR W/SCORE 07/08/2019  Addendum 07/09/2019 12:35 PM ADDENDUM REPORT: 07/09/2019 12:32  CLINICAL DATA:  Chest pain  EXAM: Cardiac CTA  MEDICATIONS: Sub lingual nitro. 4mg  x 2  TECHNIQUE: The patient was scanned on a Siemens 192 slice scanner. Gantry rotation speed was 250 msecs. Collimation was 0.6 mm. A 100 kV prospective scan was triggered in the ascending thoracic aorta at 35-75% of the R-R interval. Average HR during the scan was 60 bpm. The 3D data set was interpreted on a dedicated work station using MPR, MIP and VRT modes. A total of 80cc of contrast was used.  FINDINGS: Non-cardiac: See separate report from [facility].  Pulmonary veins drain normally to  the left atrium.  Calcium  Score: 968 Agatston units.  Coronary Arteries: Right dominant with high anterior take-off of the RCA above the right cusp.  LM: Calcified plaque distal left main, mild (<50%) stenosis.  LAD system: Extensive calcified plaque  ostial/proximal LAD. There is significant blooming artifact, very difficult to comment on degree of stenosis. Could be moderate range (51-69%), does not appear to be critical.  Circumflex system: No significant plaque or stenosis.  RCA system: High anterior take-off above the right cusp. Calcified plaque proximal RCA with mild (<50%) stenosis.  IMPRESSION: 1. Coronary artery calcium  score 968 Agatston units. This places the patient in the 98th percentile for age and gender, suggesting high risk for future cardiac events.  2.  Suspect mild proximal RCA stenosis.  3. Extensive calcified plaque in the ostial/proximal LAD. Blooming artifact makes degree of stenosis hard to comment upon. Possible moderate-range stenosis, doubt critical. Will send for FFR.  Dalton Mclean   Electronically Signed By: Ezra Shuck M.D. On: 07/09/2019 12:32  Narrative EXAM: OVER-READ INTERPRETATION  CT CHEST  The following report is an over-read performed by radiologist Dr. Toribio Aye of [facility] on 07/08/2019. This over-read does not include interpretation of cardiac or coronary anatomy or pathology. The coronary calcium  score/coronary CTA interpretation by the cardiologist is attached.  COMPARISON:  None.  FINDINGS: Aortic atherosclerosis. Within the visualized portions of the thorax there are no suspicious appearing pulmonary nodules or masses, there is no acute consolidative airspace disease, no pleural effusions, no pneumothorax and no lymphadenopathy. Visualized portions of the upper abdomen are unremarkable. There are no aggressive appearing lytic or blastic lesions noted in the visualized portions of the skeleton.  IMPRESSION: 1.  Aortic Atherosclerosis (ICD10-I70.0).  Electronically Signed: By: Toribio Aye M.D. On: 07/08/2019 16:36     ______________________________________________________________________________________________      Risk  Assessment/Calculations           Physical Exam VS:  BP 138/78   Pulse 60   Ht 5' 3 (1.6 m)   Wt 152 lb (68.9 kg)   SpO2 98%   BMI 26.93 kg/m        Wt Readings from Last 3 Encounters:  11/20/23 152 lb (68.9 kg)  10/15/23 148 lb (67.1 kg)  09/23/23 149 lb (67.6 kg)    GEN: Well nourished, well developed in no acute distress NECK: No JVD; No carotid bruits CARDIAC: RRR, no murmurs, rubs, gallops RESPIRATORY:  Clear to auscultation without rales, wheezing or rhonchi  ABDOMEN: Soft, non-tender, non-distended EXTREMITIES:  No edema; No deformity   ASSESSMENT AND PLAN  Preop - According to the Revised Cardiac Risk Index (RCRI), her Perioperative Risk of Major Cardiac Event is (%): 6.6. Her Functional Capacity in METs is: 4.73 according to the Duke Activity Status Index (DASI). Per AHA/ACC guidelines, she is deemed acceptable risk for the planned procedure without additional cardiovascular testing. Will route to surgical team so they are aware.  She is not taking antiplatelet nor anticoagulant that would need to be held.   HTN  -Initial BP 142/76 with repeat reading without intervention 138/78. Requests to check at home prior to medication changes. Continue irbesartan  300mg  AM and Atenolol  25mg  PM. MyChart in 1 week to check in, if BP Not at goal plan to add Doxazosin QHS. Hydrochlorothiazide  previously stopped due to gout flares.    CAD - Stable with no anginal symptoms. No indication for ischemic evaluation.  GDMT includes Atenolol  25 mg daily daily rosuvastatin  10mg  daily. Heart  healthy diet and regular cardiovascular exercise encouraged.  Previously self discontinued Aspirin due to perceived as causing gout flares.    OSA - Previously followed by Dr. Burnard. Will refer to Dr. Shlomo for management of sleep apnea. She is working to get back to wearing her CPAP more regularly.    HLD - 08/2023 LCL 63. DL goal <29. Continue Rosuvastatin  10mg  QD.       Dispo: follow up as scheduled  with Dr. Raford in October as scheduled  Signed, Christine GORMAN Finder, NP

## 2023-11-20 NOTE — Patient Instructions (Addendum)
 Medication Instructions:   Continue your current medications  *If you need a refill on your cardiac medications before your next appointment, please call your pharmacy*  Testing/Procedures: Your EKG today looked good!  Follow-Up: At [facility], you and your health needs are our priority.  As part of our continuing mission to provide you with exceptional heart care, our providers are all part of one team.  This team includes your primary Cardiologist (physician) and Advanced Practice Providers or APPs (Physician Assistants and Nurse Practitioners) who all work together to provide you with the care you need, when you need it.  Your next appointment:   As scheduled with Dr. Raford in October AND With Dr. Shlomo for CPAP management in 3-4 months   We recommend signing up for the patient portal called MyChart.  Sign up information is provided on this After Visit Summary.  MyChart is used to connect with patients for Virtual Visits (Telemedicine).  Patients are able to view lab/test results, encounter notes, upcoming appointments, etc.  Non-urgent messages can be sent to your provider as well.   To learn more about what you can do with MyChart, go to [email or web address].   Other Instructions  Tips to Measure your Blood Pressure Correctly  To determine whether you have hypertension, a medical professional will take a blood pressure reading. How you prepare for the test, the position of your arm, and other factors can change a blood pressure reading by 10% or more. That could be enough to hide high blood pressure, start you on a drug you don't really need, or lead your doctor to incorrectly adjust your medications.  National and international guidelines offer specific instructions for measuring blood pressure. If a doctor, nurse, or medical assistant isn't doing it right, don't hesitate to ask him or her to get with the guidelines.  Here's what you can do to ensure a correct  reading:  Don't drink a caffeinated beverage or smoke during the 30 minutes before the test.  Sit quietly for five minutes before the test begins.  During the measurement, sit in a chair with your feet on the floor and your arm supported so your elbow is at about heart level.  The inflatable part of the cuff should completely cover at least 80% of your upper arm, and the cuff should be placed on bare skin, not over a shirt.  Don't talk during the measurement.  Have your blood pressure measured twice, with a brief break in between. If the readings are different by 5 points or more, have it done a third time.  In 2017, new guidelines from the American Heart Association, the [employer] of Cardiology, and nine other health organizations lowered the diagnosis of high blood pressure to 130/80 mm Hg or higher for all adults. The guidelines also redefined the various blood pressure categories to now include normal, elevated, Stage 1 hypertension, Stage 2 hypertension, and hypertensive crisis (see Blood pressure categories).  Blood pressure categories  Blood pressure category SYSTOLIC (upper number)  DIASTOLIC (lower number)  Normal Less than 120 mm Hg and Less than 80 mm Hg  Elevated 120-129 mm Hg and Less than 80 mm Hg  High blood pressure: Stage 1 hypertension 130-139 mm Hg or 80-89 mm Hg  High blood pressure: Stage 2 hypertension 140 mm Hg or higher or 90 mm Hg or higher  Hypertensive crisis (consult your doctor immediately) Higher than 180 mm Hg and/or Higher than 120 mm Hg  Source: American Heart Association  and American Stroke Association. For more on getting your blood pressure under control, buy Controlling Your Blood Pressure, a Special Health Report from [facility].   Blood Pressure Log   Date   Time  Blood Pressure  Position  Example: Nov 1 9 AM 124/78 sitting

## 2023-11-29 ENCOUNTER — Other Ambulatory Visit: Payer: Self-pay

## 2023-11-29 ENCOUNTER — Emergency Department (HOSPITAL_BASED_OUTPATIENT_CLINIC_OR_DEPARTMENT_OTHER): Admission: EM | Admit: 2023-11-29 | Discharge: 2023-11-29 | Disposition: A | Discharge status: Home / Self Care

## 2023-11-29 ENCOUNTER — Emergency Department (HOSPITAL_BASED_OUTPATIENT_CLINIC_OR_DEPARTMENT_OTHER): Admitting: Radiology

## 2023-11-29 ENCOUNTER — Encounter (HOSPITAL_BASED_OUTPATIENT_CLINIC_OR_DEPARTMENT_OTHER): Payer: Self-pay

## 2023-11-29 DIAGNOSIS — I10 Essential (primary) hypertension: Secondary | ICD-10-CM | POA: Insufficient documentation

## 2023-11-29 DIAGNOSIS — Z79899 Other long term (current) drug therapy: Secondary | ICD-10-CM | POA: Insufficient documentation

## 2023-11-29 DIAGNOSIS — M109 Gout, unspecified: Secondary | ICD-10-CM

## 2023-11-29 DIAGNOSIS — Z91010 Allergy to peanuts: Secondary | ICD-10-CM | POA: Insufficient documentation

## 2023-11-29 DIAGNOSIS — M10079 Idiopathic gout, unspecified ankle and foot: Secondary | ICD-10-CM | POA: Insufficient documentation

## 2023-11-29 LAB — BASIC METABOLIC PANEL WITH GFR
Anion gap: 12 (ref 5–15)
BUN: 14 mg/dL (ref 8–23)
CO2: 22 mmol/L (ref 22–32)
Calcium: 9.5 mg/dL (ref 8.9–10.3)
Chloride: 106 mmol/L (ref 98–111)
Creatinine, Ser: 1.15 mg/dL — ABNORMAL HIGH (ref 0.44–1.00)
GFR, Estimated: 50 mL/min — ABNORMAL LOW (ref >60)
Glucose, Bld: 79 mg/dL (ref 70–99)
Potassium: 4.1 mmol/L (ref 3.5–5.1)
Sodium: 141 mmol/L (ref 135–145)

## 2023-11-29 LAB — CBC
HCT: 36.9 % (ref 36.0–46.0)
Hemoglobin: 12.2 g/dL (ref 12.0–15.0)
MCH: 30.4 pg (ref 26.0–34.0)
MCHC: 33.1 g/dL (ref 30.0–36.0)
MCV: 92 fL (ref 80.0–100.0)
Platelets: 176 K/uL (ref 150–400)
RBC: 4.01 MIL/uL (ref 3.87–5.11)
RDW: 14.6 % (ref 11.5–15.5)
WBC: 4.1 K/uL (ref 4.0–10.5)
nRBC: 0 % (ref 0.0–0.2)

## 2023-11-29 LAB — TROPONIN T, HIGH SENSITIVITY: Troponin T High Sensitivity: <15 ng/L (ref <19)

## 2023-11-29 MED ORDER — DOXAZOSIN MESYLATE 1 MG PO TABS: 1.0000 mg | ORAL_TABLET | Freq: Every day | ORAL | 0 refills | Status: DC

## 2023-11-29 MED ORDER — HYDROXYZINE HCL 25 MG PO TABS
25.0000 mg | ORAL_TABLET | Freq: Once | ORAL | Status: AC
  Administered 2023-11-29: 25 mg via ORAL
  Filled 2023-11-29: qty 1

## 2023-11-29 MED ORDER — KETOROLAC TROMETHAMINE 15 MG/ML IJ SOLN
15.0000 mg | Freq: Once | INTRAMUSCULAR | Status: AC
  Administered 2023-11-29: 15 mg via INTRAVENOUS
  Filled 2023-11-29: qty 1

## 2023-11-29 MED ORDER — PREDNISONE 10 MG PO TABS: 20.0000 mg | ORAL_TABLET | Freq: Every day | ORAL | 0 refills | Status: AC

## 2023-11-29 NOTE — ED Provider Notes (Signed)
 [place] EMERGENCY DEPARTMENT AT [facility] Provider Note   CSN: [id number] Arrival date & time: 11/29/23  9081     Patient presents with: Hypertension   Christine Steele is a 73 y.o. female.   73 year old female presents for evaluation of high blood pressure.  She is a poor historian.  She states last night she took an ibuprofen and developed some headache and left facial paresthesias.  She states its improved today.  States she checked her blood pressure this morning and started in the 130s and increased to the 190s.  She states her primary care doctor recommended she start a second antihypertensive but she did not want to go.  Denies any other symptoms or concerns.   Hypertension Pertinent negatives include no chest pain, no abdominal pain and no shortness of breath.       Prior to Admission medications   Medication Sig Start Date End Date Taking? Authorizing Provider  doxazosin  (CARDURA ) 1 MG tablet Take 1 tablet (1 mg total) by mouth at bedtime. 11/29/23  Yes Janeya Deyo L, DO  predniSONE  (DELTASONE ) 10 MG tablet Take 2 tablets (20 mg total) by mouth daily with breakfast for 5 days. 11/29/23 12/04/23 Yes Zain Bingman L, DO  Ascorbic Acid (VITAMIN C PO) Take 1 tablet by mouth daily.    [provider]  atenolol  (TENORMIN ) 25 MG tablet Take 1 tablet (25 mg total) by mouth daily. 11/02/23   Raford Riggs, MD  Calcium  Carbonate-Vit D-Min (CALCIUM  1200 PO) Take 1 tablet by mouth daily.    [provider]  Cholecalciferol (VITAMIN D3) 250 MCG (10000 UT) capsule Take 10,000 Units by mouth once a week.    [provider]  colchicine  0.6 MG tablet Take 1.2mg  (2 tablets) then 0.6mg  (1 tablet) 1 hour after. Then, take 1 tablet every day for 7 days. Patient not taking: Reported on 11/20/2023 01/21/23   Silva Juliene JONELLE, DPM  irbesartan  (AVAPRO ) 300 MG tablet Take 300 mg by mouth daily. 10/02/23   [provider]  Multiple Vitamin  (MULTIVITAMIN WITH MINERALS) TABS tablet Take 1 tablet by mouth daily.    [provider]  rosuvastatin  (CRESTOR ) 10 MG tablet TAKE 1 TABLET BY MOUTH DAILY AT 6 PM. 01/08/23   Raford Riggs, MD    Allergies: Amlodipine, Chocolate, Chocolate flavoring agent (non-screening), Coconut fatty acid, Hydralazine , Hydralazine  hcl, Other, and Peanut-containing drug products    Review of Systems  Constitutional:  Negative for chills and fever.  HENT:  Negative for ear pain and sore throat.   Eyes:  Negative for pain and visual disturbance.  Respiratory:  Negative for cough and shortness of breath.   Cardiovascular:  Negative for chest pain and palpitations.  Gastrointestinal:  Negative for abdominal pain and vomiting.  Genitourinary:  Negative for dysuria and hematuria.  Musculoskeletal:  Negative for arthralgias and back pain.  Skin:  Negative for color change and rash.  Neurological:  Negative for seizures and syncope.  All other systems reviewed and are negative.   Updated Vital Signs BP (!) 178/77   Pulse (!) 53   Temp 98.6 F (37 C) (Oral)   Resp 14   SpO2 100%   Physical Exam Vitals and nursing note reviewed.  Constitutional:      General: She is not in acute distress.    Appearance: Normal appearance. She is well-developed. She is not ill-appearing.  HENT:     Head: Normocephalic and atraumatic.  Eyes:     Conjunctiva/sclera: Conjunctivae  normal.  Cardiovascular:     Rate and Rhythm: Normal rate and regular rhythm.     Pulses: Normal pulses.     Heart sounds: Normal heart sounds. No murmur heard. Pulmonary:     Effort: Pulmonary effort is normal. No respiratory distress.     Breath sounds: Normal breath sounds. No stridor.  Abdominal:     Palpations: Abdomen is soft.     Tenderness: There is no abdominal tenderness.  Musculoskeletal:        General: No swelling.     Cervical back: Neck supple.  Skin:    General: Skin is warm and dry.     Capillary Refill:  Capillary refill takes less than 2 seconds.  Neurological:     General: No focal deficit present.     Mental Status: She is alert.  Psychiatric:        Mood and Affect: Mood normal.     (all labs ordered are listed, but only abnormal results are displayed) Labs Reviewed  BASIC METABOLIC PANEL WITH GFR - Abnormal; Notable for the following components:      Result Value   Creatinine, Ser 1.15 (*)    GFR, Estimated 50 (*)    All other components within normal limits  CBC  TROPONIN T, HIGH SENSITIVITY  TROPONIN T, HIGH SENSITIVITY    EKG: EKG Interpretation Date/Time:  Sunday November 29 2023 09:35:22 EDT Ventricular Rate:  55 PR Interval:  135 QRS Duration:  82 QT Interval:  433 QTC Calculation: 415 R Axis:   43  Text Interpretation: Sinus rhythm Low voltage, precordial leads Probable anteroseptal infarct, old Compared with previous EKG from 11/20/2023 Confirmed by Gennaro Bouchard ([id number]) on 11/29/2023 9:47:39 AM  Radiology: [facility] Chest 2 View Result Date: 11/29/2023 CLINICAL DATA:  Hypertension. EXAM: CHEST - 2 VIEW COMPARISON:  10/27/2009 FINDINGS: The heart size and mediastinal contours are within normal limits. Both lungs are clear. Mild thoracic spine degenerative changes noted. IMPRESSION: No active cardiopulmonary disease. Electronically Signed   By: Norleen DELENA Kil M.D.   On: 11/29/2023 10:23     Procedures   Medications Ordered in the ED  ketorolac  (TORADOL ) 15 MG/ML injection 15 mg (15 mg Intravenous Given 11/29/23 1104)  hydrOXYzine  (ATARAX ) tablet 25 mg (25 mg Oral Given 11/29/23 1104)                                    Medical Decision Making Cardiac monitor interpretation: Sinus bradycardia, no STEMI  Patient here for elevated blood pressure that is fairly asymptomatic at this time.  Lab workup negative.  Blood pressure fluctuates between 150-190 without any recurrence of her symptoms.  I started her on doxazosin  per cardiology recommendations in their outpatient  note.  They saw her recently.  Daughter states she has been feeling very anxious and stressed lately as well as she is taking care of her ill husband at home.  I gave her a dose of Atarax  here and will treat her with steroids for gout.  States she has not been taking anything for gout as the colchicine  made her stomach upset.  Advise close follow-up with primary care and cardiology and otherwise return to the ER for new or worsening symptoms.  Patient and daughter feel comfortable to plan be discharged home.  Problems Addressed: Acute gout of foot, unspecified cause, unspecified laterality: acute illness or injury Hypertension, unspecified type: chronic illness or injury with  exacerbation, progression, or side effects of treatment  Amount and/or Complexity of Data Reviewed Independent Historian: caregiver    Details: Daughter at bedside helps provide history that patient was recently diagnosed with gout and it has not improved External Data Reviewed: notes.    Details: Outpatient cardiology visit reviewed and she is supposed to be on doxazosin  at nighttime but did not want to start taking it Labs: ordered. Decision-making details documented in ED Course.    Details: Ordered and reviewed by me and unremarkable.  Troponin negative, CBC and BMP are within normal limits Radiology: ordered and independent interpretation performed. Decision-making details documented in ED Course.    Details: Ordered and interpreted independently radiology Chest x-ray: Shows no acute abnormality in the chest ECG/medicine tests: ordered and independent interpretation performed. Decision-making details documented in ED Course.    Details: Ordered and interpreted by me in the absence of cardiology and shows sinus rhythm, no STEMI and no acute change when compared to prior EKG  Risk OTC drugs. Prescription drug management. Drug therapy requiring intensive monitoring for toxicity. Diagnosis or treatment significantly  limited by social determinants of health.   Final diagnoses:  Hypertension, unspecified type  Acute gout of foot, unspecified cause, unspecified laterality    ED Discharge Orders          Ordered    doxazosin  (CARDURA ) 1 MG tablet  Daily at bedtime        11/29/23 1049    predniSONE  (DELTASONE ) 10 MG tablet  Daily with breakfast        11/29/23 1049               Oakes Mccready L, DO 11/29/23 1453

## 2023-11-29 NOTE — ED Triage Notes (Signed)
 Patient reports her blood pressure has been elevating for a while. She saw her provider and she states the doctor wanted to put her on medication but the patient states she didn't want to but now she has headache, nausea, and facial numbness that started yesterday.

## 2023-11-29 NOTE — Discharge Instructions (Addendum)
 Take your doxazosin  which is for your blood pressure at nighttime.  Start tonight.  Take your steroids as prescribed for gout.  Start tomorrow.  Please call and follow-up with your cardiologist next week to further discuss your blood pressure.  You can also use Tylenol  as needed for pain.

## 2023-12-08 ENCOUNTER — Other Ambulatory Visit: Payer: Self-pay | Admitting: Cardiovascular Disease

## 2023-12-08 DIAGNOSIS — I10 Essential (primary) hypertension: Secondary | ICD-10-CM

## 2023-12-16 ENCOUNTER — Telehealth: Payer: Self-pay | Admitting: Family

## 2023-12-16 NOTE — Telephone Encounter (Signed)
 Left message to call back.

## 2023-12-16 NOTE — Telephone Encounter (Signed)
 Pt c/o BP issue: STAT if pt c/o blurred vision, one-sided weakness or slurred speech.  STAT if BP is GREATER than 180/120 TODAY.  STAT if BP is LESS than 90/60 and SYMPTOMATIC TODAY  1. What is your BP concern? BP is becoming elevated again  2. Have you taken any BP medication today? yes  3. What are your last 5 BP readings?155/86  4. Are you having any other symptoms (ex. Dizziness, headache, blurred vision, passed out)? States when she first gets up she can feel herself getting dizzy. Some lightheadedness   Patient wants to know if a refill for doxazosin  (CARDURA ) 1 MG tablet can be sent in

## 2023-12-17 MED ORDER — DOXAZOSIN MESYLATE 1 MG PO TABS
1.0000 mg | ORAL_TABLET | Freq: Every day | ORAL | 3 refills | Status: DC
Start: 2023-12-17 — End: 2024-02-02

## 2023-12-17 NOTE — Telephone Encounter (Signed)
 Spoke with pt, she went to the ER with her blood pressure high and they only gave her 14 tablets of doxazosin . Refill sent to the pharmacy electronically.

## 2024-01-04 ENCOUNTER — Other Ambulatory Visit (HOSPITAL_BASED_OUTPATIENT_CLINIC_OR_DEPARTMENT_OTHER): Payer: Self-pay | Admitting: Cardiovascular Disease

## 2024-02-02 ENCOUNTER — Ambulatory Visit (INDEPENDENT_AMBULATORY_CARE_PROVIDER_SITE_OTHER): Admitting: Cardiovascular Disease

## 2024-02-02 ENCOUNTER — Encounter (HOSPITAL_BASED_OUTPATIENT_CLINIC_OR_DEPARTMENT_OTHER): Payer: Self-pay | Admitting: Cardiovascular Disease

## 2024-02-02 VITALS — BP 144/64 | HR 64 | Ht 63.0 in | Wt 156.2 lb

## 2024-02-02 DIAGNOSIS — I2510 Atherosclerotic heart disease of native coronary artery without angina pectoris: Secondary | ICD-10-CM

## 2024-02-02 DIAGNOSIS — E7800 Pure hypercholesterolemia, unspecified: Secondary | ICD-10-CM

## 2024-02-02 DIAGNOSIS — I671 Cerebral aneurysm, nonruptured: Secondary | ICD-10-CM | POA: Diagnosis not present

## 2024-02-02 DIAGNOSIS — R531 Weakness: Secondary | ICD-10-CM | POA: Diagnosis not present

## 2024-02-02 DIAGNOSIS — G4733 Obstructive sleep apnea (adult) (pediatric): Secondary | ICD-10-CM

## 2024-02-02 DIAGNOSIS — I10 Essential (primary) hypertension: Secondary | ICD-10-CM | POA: Diagnosis not present

## 2024-02-02 MED ORDER — SPIRONOLACTONE 25 MG PO TABS: 25.0000 mg | ORAL_TABLET | Freq: Every day | ORAL | 3 refills | Status: DC

## 2024-02-02 NOTE — Patient Instructions (Addendum)
 Medication Instructions:  Your physician has recommended you make the following change in your medication:  1.) stop doxazosin  2.) start spironolactone 25 mg - take one tablet daily  *If you need a refill on your cardiac medications before your next appointment, please call your pharmacy*  Lab Work: Return in one week for Basic metabolic panel  Testing/Procedures:    Please report to Radiology at the [facility] Main Entrance 30 minutes early for your test.  [address] [place], [place] [address]                         OR   Please report to Radiology at [facility] Main Entrance, medical mall, 30 mins prior to your test.  [address]  [place], [place]  How to Prepare for Your Cardiac PET/CT Stress Test:  Nothing to eat or drink, except water, 3 hours prior to arrival time.  NO caffeine/decaffeinated products, or chocolate 12 hours prior to arrival. (Please note decaffeinated beverages (teas/coffees) still contain caffeine).  If you have caffeine within 12 hours prior, the test will need to be rescheduled.  Medication instructions: Do not take nitrates (isosorbide mononitrate, Ranexa) the day before or day of test Do not take tamsulosin the day before or morning of test Hold theophylline containing medications for 12 hours. Hold Dipyridamole 48 hours prior to the test.  Diabetic Preparation: If able to eat breakfast prior to 3 hour fasting, you may take all medications, including your insulin. Do not worry if you miss your breakfast dose of insulin - start at your next meal. If you do not eat prior to 3 hour fast-Hold all diabetes (oral and insulin) medications. Patients who wear a continuous glucose monitor MUST remove the device prior to scanning.  You may take your remaining medications with water.  NO perfume, cologne or lotion on chest or abdomen area. FEMALES - Please avoid wearing dresses to this appointment.  Total time  is 1 to 2 hours; you may want to bring reading material for the waiting time.  IF YOU THINK YOU MAY BE PREGNANT, OR ARE NURSING PLEASE INFORM THE TECHNOLOGIST.  In preparation for your appointment, medication and supplies will be purchased.  Appointment availability is limited, so if you need to cancel or reschedule, please call the Radiology Department Scheduler at [phone] 24 hours in advance to avoid a cancellation fee of $100.00  What to Expect When you Arrive:  Once you arrive and check in for your appointment, you will be taken to a preparation room within the Radiology Department.  A technologist or Nurse will obtain your medical history, verify that you are correctly prepped for the exam, and explain the procedure.  Afterwards, an IV will be started in your arm and electrodes will be placed on your skin for EKG monitoring during the stress portion of the exam. Then you will be escorted to the PET/CT scanner.  There, staff will get you positioned on the scanner and obtain a blood pressure and EKG.  During the exam, you will continue to be connected to the EKG and blood pressure machines.  A small, safe amount of a radioactive tracer will be injected in your IV to obtain a series of pictures of your heart along with an injection of a stress agent.    After your Exam:  It is recommended that you eat a meal and drink a caffeinated beverage to counter act any  effects of the stress agent.  Drink plenty of fluids for the remainder of the day and urinate frequently for the first couple of hours after the exam.  Your doctor will inform you of your test results within 7-10 business days.  For more information and frequently asked questions, please visit our website: [email or web address]  For questions about your test or how to prepare for your test, please call: Cardiac Imaging Nurse Navigators Office: [phone]   Follow-Up: At [facility], you and your health  needs are our priority.  As part of our continuing mission to provide you with exceptional heart care, our providers are all part of one team.  This team includes your primary Cardiologist (physician) and Advanced Practice Providers or APPs (Physician Assistants and Nurse Practitioners) who all work together to provide you with the care you need, when you need it.  Your next appointment:   2 months  Provider:   Annabella Scarce, MD or Reche Finder, NP

## 2024-02-02 NOTE — Progress Notes (Signed)
 Cardiology Office Note:  .   Date:  02/02/2024  ID:  Christine Steele, DOB [birth year], MRN [id number] PCP: Clarice Nottingham, MD  [facility] Providers Cardiologist:  Annabella Scarce, MD    History of Present Illness: .    Christine Steele is a 73 y.o. female with moderate CAD, hypertension, gout, non-ruptured brain aneurysm, and OSA on CPAP here for follow up.  She was initially seen 03/2018 for chest tightness.  Christine Steele reported episodes of chest discomfort associated with L arm weakness.  In general she has been feeling weak and tired.  The chest discomfort comes and goes.  She first noticed it after she switched from bisoprolol to atenolol .  The pain is up to 8/10 in severity and is not associated with shortness of breath, nausea or diaphoresis.  She was referred for a Lexiscan  Myoview  03/2018 that was negative for ischemia.  She continued to have atypical symptoms and had a coronary CT-A 06/2019 that showed moderate LAD disease.  Calcium  score was 98th percentile.     Christine Steele has been experiencing spells of feeling weak and then radiating into her chest and left side.  Her PCP stopped HCTZ to see if that helped.  Her BP became elevated.  Losartan was switched to irbesartan .  She was seen in the ED 09/20/2019 with BP in the 160s. She has been participating in the PREP program to the [facility].  Her blood pressures are noted to be high there as well.  Hydralazine  was added to her regimen 08/2019.  She followed up with Dr. Clarice and hydralazine  was stopped.  Atenolol  was increased to 50mg  and then her BP was running in the 80s-110s/50-60s and she was feeling better but tired.  She did a great job of weight loss and blood pressures had stabilized.  At her visit 07/2002 she was doing okay but did note some chest discomfort when fatigued.  It occurred more so when she was tired and not with exertion.  Blood pressure remained well-controlled and atenolol  was reduced due to fatigue.  She had swelling when eating  salty foods.  She was also encouraged to start back using her CPAP machine.  Christine Steele saw Reche Finder, NP 11/2023 for preoperative clearance.  She was felt to be low risk.  Blood pressure was slightly above goal and she was asked to track her pressure with consideration for adding doxazosin  if needed.  Prior antihypertensives:  Hydrochlorothiazide - gout Hydralazine  Losartan   Discussed the use of AI scribe software for clinical note transcription with the patient, who gave verbal consent to proceed.  History of Present Illness Christine Steele experiences intermittent episodes of weakness, described as feeling 'weak' and 'bad,' associated with lack of rest and elevated blood pressure. The last episode occurred around January 27, 2024.  Her blood pressure has been elevated, with recent readings in the 140s. In August, she visited the emergency room with a blood pressure reading of 200/90 mmHg. She was prescribed doxazosin  1 mg, which she started taking after a hospital visit. Despite this, her blood pressure remains elevated at 140 mmHg as of this morning. She is also on irbesartan  and atenolol  for blood pressure management. Hydrochlorothiazide  was previously stopped due to gout.  She has a history of gout, with the last attack occurring two months ago. She can feel the onset of attacks and tries to manage them by resting and increasing water intake. She previously took allopurinol for 90 days but experienced severe attacks during  that time. She reports nausea with gout medication.  She has not been exercising due to a series of personal and health challenges, including the passing of her mother and her husband's cancer diagnosis. Gout attacks have also limited her ability to exercise due to foot pain.  She experiences hair loss and has concerns about autoimmune issues. Her thyroid  was last checked in 2024. She reports fatigue, often feeling 'sleepy' rather than physically tired, and attributes this to  not getting adequate rest. She has a history of coronary artery disease, with a CT scan in 2021 showing moderate plaque in her heart arteries.  Her cholesterol levels are well-controlled with rosuvastatin , with an LDL of 53 and total cholesterol of 149 as of May 2025.   ROS:  As per HPI  Studies Reviewed: SABRA       Coronary CT-A 06/2019: IMPRESSION: 1. Coronary artery calcium  score 968 Agatston units. This places the patient in the 98th percentile for age and gender, suggesting high risk for future cardiac events.   2.  Suspect mild proximal RCA stenosis.   3. Extensive calcified plaque in the ostial/proximal LAD. Blooming artifact makes degree of stenosis hard to comment upon. Possible moderate-range stenosis, doubt critical. Will send for FFR.  Risk Assessment/Calculations:     HYPERTENSION CONTROL Vitals:   02/02/24 0920 02/02/24 0952  BP: (!) 146/74 (!) 144/64    The patient's blood pressure is elevated above target today.  In order to address the patient's elevated BP: A new medication was prescribed today.          Physical Exam:   VS:  BP (!) 144/64   Pulse 64   Ht 5' 3 (1.6 m)   Wt 156 lb 3.2 oz (70.9 kg)   SpO2 96%   BMI 27.67 kg/m  , BMI Body mass index is 27.67 kg/m. GENERAL:  Well appearing HEENT: Pupils equal round and reactive, fundi not visualized, oral mucosa unremarkable NECK:  No jugular venous distention, waveform within normal limits, carotid upstroke brisk and symmetric, no bruits, no thyromegaly LUNGS:  Clear to auscultation bilaterally HEART:  RRR.  PMI not displaced or sustained,S1 and S2 within normal limits, no S3, no S4, no clicks, no rubs, no murmurs ABD:  Flat, positive bowel sounds normal in frequency in pitch, no bruits, no rebound, no guarding, no midline pulsatile mass, no hepatomegaly, no splenomegaly EXT:  2 plus pulses throughout, no edema, no cyanosis no clubbing SKIN:  No rashes no nodules NEURO:  Cranial nerves II through XII  grossly intact, motor grossly intact throughout PSYCH:  Cognitively intact, oriented to person place and time   ASSESSMENT AND PLAN: .    Assessment & Plan # Atherosclerotic coronary artery disease without angina Coronary artery disease with moderate plaque. Symptoms of fatigue and weakness may indicate worsening. Differential diagnosis includes coronary disease contribution. - Order cardiac PET stress test to evaluate blood flow. - Consider heart catheterization if stress test is abnormal. - Schedule follow-up to discuss stress test results.  # Primary hypertension Hypertension with recent readings in the 140s. Current regimen includes irbesartan  and atenolol . Doxazosin  ineffective. Discussed switching to spironolactone for better control, aiming for readings under 130. Spironolactone can raise potassium levels, so labs will be monitored. - Discontinue doxazosin . - Initiate spironolactone 25 mg. - Order labs in one week to monitor potassium levels. - Encourage exercise and dietary salt reduction.  # Hypercholesterolemia Well-controlled with rosuvastatin . Recent labs show LDL at 53 and total cholesterol at 149.  #  Gout Gout with last attack two months ago. Previous allopurinol ineffective.  # General Health Maintenance Encouraged regular exercise and dietary modifications for cardiovascular health.  # Follow-up Follow-up plans discussed to monitor hypertension and coronary artery disease. - Follow up in two months.      Informed Consent   Shared Decision Making/Informed Consent The risks [chest pain, shortness of breath, cardiac arrhythmias, dizziness, blood pressure fluctuations, myocardial infarction, stroke/transient ischemic attack, nausea, vomiting, allergic reaction, radiation exposure, metallic taste sensation and life-threatening complications (estimated to be 1 in 10,000)], benefits (risk stratification, diagnosing coronary artery disease, treatment guidance) and  alternatives of a cardiac PET stress test were discussed in detail with Ms. Beckles and she agrees to proceed.     Dispo: f/u in   Signed, Annabella Scarce, MD

## 2024-02-11 ENCOUNTER — Other Ambulatory Visit

## 2024-02-12 DIAGNOSIS — I10 Essential (primary) hypertension: Secondary | ICD-10-CM | POA: Diagnosis not present

## 2024-02-12 LAB — BASIC METABOLIC PANEL WITH GFR
BUN/Creatinine Ratio: 14 (ref 12–28)
BUN: 20 mg/dL (ref 8–27)
CO2: 18 mmol/L — ABNORMAL LOW (ref 20–29)
Calcium: 9.3 mg/dL (ref 8.7–10.3)
Chloride: 104 mmol/L (ref 96–106)
Creatinine, Ser: 1.47 mg/dL — ABNORMAL HIGH (ref 0.57–1.00)
Glucose: 78 mg/dL (ref 70–99)
Potassium: 4.6 mmol/L (ref 3.5–5.2)
Sodium: 137 mmol/L (ref 134–144)
eGFR: 38 mL/min/1.73 — ABNORMAL LOW (ref >59)

## 2024-02-15 ENCOUNTER — Ambulatory Visit (HOSPITAL_BASED_OUTPATIENT_CLINIC_OR_DEPARTMENT_OTHER): Payer: Self-pay | Admitting: Family

## 2024-02-16 ENCOUNTER — Telehealth (HOSPITAL_BASED_OUTPATIENT_CLINIC_OR_DEPARTMENT_OTHER): Payer: Self-pay | Admitting: *Deleted

## 2024-02-16 NOTE — Telephone Encounter (Signed)
 Left message to call back.

## 2024-02-16 NOTE — Telephone Encounter (Signed)
 Reche GORMAN Finder, NP 02/15/2024  1:44 PM EDT     Kidney function decrease from previous.  Recommend increasing oral hydration.  Reduce spironolactone to 12.5 mg daily.  Repeat BMP on 02/24/24 when she is at [facility] office to see Dr. Shlomo.    Reviewed lab results with patient  Per patient since she started taking the Aldactone 25 mg daily she has had the following symptoms  Unable to focus/get thoughts Sharp pains in head, do not last  Funny feeling in chest - asked several times to try to describe, unable to do so Fatigue  Symptoms start about an hour after taking Aldactone but improves as day goes on   Blood pressures running 130's-140's only 2 readings in 120's. Is checking blood pressure about an hour after medications   Will forward to Caitlin W NP for review

## 2024-02-16 NOTE — Telephone Encounter (Signed)
 Patient returned RN's call regarding results.

## 2024-02-16 NOTE — Telephone Encounter (Signed)
 Atypical side effects of Spironolactone. If she is willing to continue half tablet that would be best. If she prefers to hold for 3 days and let us  know how she is feeling Friday that is also reasonable.   Shlomie Romig S Doralyn Kirkes, NP

## 2024-02-19 NOTE — Telephone Encounter (Signed)
 Spoke with patient who stated that on Tuesday when we spoke she went home and her blood pressure was low. SBP below 100 Thinks this was contributing to how she was feeling  Next morning blood pressure 99/57 & 94/54 so she didn't even try taking the 1/2 Spironolactone  Yesterday blood pressure was 123/66 and last night 128/71, no Spironolactone  This am prior to Irbesartan  116/70  Advised to remain off the Spironolactone over the weekend and monitor blood pressure  If starts to go up take 1/2 tablet and call beginning of week with update on how blood pressures are doing  Will forward to Reche ORN NP for review

## 2024-02-19 NOTE — Telephone Encounter (Signed)
Agree with plan as provided.   Alver Sorrow, NP

## 2024-02-23 ENCOUNTER — Telehealth: Payer: Self-pay

## 2024-02-23 NOTE — Telephone Encounter (Signed)
 Christine Steele

## 2024-02-24 ENCOUNTER — Ambulatory Visit: Attending: Cardiology | Admitting: Cardiology

## 2024-02-24 ENCOUNTER — Encounter: Payer: Self-pay | Admitting: Cardiology

## 2024-02-24 VITALS — BP 134/72 | HR 66 | Ht 63.0 in | Wt 154.2 lb

## 2024-02-24 DIAGNOSIS — G4733 Obstructive sleep apnea (adult) (pediatric): Secondary | ICD-10-CM | POA: Insufficient documentation

## 2024-02-24 DIAGNOSIS — I10 Essential (primary) hypertension: Secondary | ICD-10-CM | POA: Diagnosis not present

## 2024-02-24 NOTE — Progress Notes (Signed)
 SLEEP MEDICINE OFFICE VISIT NOTE:  PCP:  Clarice Nottingham, MD  Cardiologist:  Annabella Scarce, MD    Referring MD: Clarice Nottingham, MD   Chief Complaint  Patient presents with   Sleep Apnea   Hypertension    History of Present Illness:    Christine Steele is a 73 y.o. female with a hx of chronic kidney disease, obstructive sleep apnea on CPAP, hypertension and vitamin D deficiency.  She was followed by Dr. Burnard for her sleep apnea and upon his retirement she is now referred to me to establish care.  She had an initial PSG done in January 2020 showing moderate obstructive sleep apnea with an AHI of 22.4/h but during REM sleep was severe with an AHI of 49.8/h.  O2 saturation nadir was 78%.  She underwent CPAP titration and was titrated 10 cm H2O.  She ultimately was changed to auto CPAP from 10 to 20 cm H2O.  When she was seen back in 2024 she had actually stopped her CPAP because she was intolerant to the nasal pillows which called nose burning.  She was also caring for her mother and was too busy to ever reinstitute treatment.  She did complain of snoring and frequent awakenings as well as nocturia 2-4 times per night.  She at that time wanted to restart therapy.  She was restarted on auto CPAP from 11 to 20 cm H2O.  Her mask was also changed to a ResMed AirFit N30 mask to reduce potential nasal irritation from the prior nasal pillows.  Her pressure was changed at her last office visit to 13 to 20 cm H2O due to increased AHI of 13.1.  Apparently she had been applying cream to her face after she washed her face at night and was then having a poor mask seal with increased leakage.  She was instructed not to do this anymore.  She is doing well with her PAP device and thinks that she has gotten used to it.  She tolerates the full face mask and feels the pressure is adequate.  She feels rested in the am and has no significant daytime sleepiness if she goes to bed on time.  She denies any significant  nasal dryness or nasal congestion.  She has had some mouth dryness at times and also this week started running out of water in the chamber.  Her husband says that sometimes she will snore if she is very tired.  An Epworth Sleepiness Scale score was calculated the office today and this endorsed at 4 arguing against residual daytime sleepiness. Patient denies any episodes of bruxism, restless legs, No gagging hallucinations or cataplectic events.    Past Medical History:  Diagnosis Date   Aneurysm    2mm Right MCA   Arthritis    Cataract    Bilateral   Chronic kidney disease    Dr. Clarice (PCP)    Hypertension    OSA on CPAP    havent used CPAP in long time per patient due to unable to get filters to change out   Osteopenia 01/2017   T score -1.4 FRAX 3.4% / 0.4%   Tachycardia    Vitamin D deficiency     Past Surgical History:  Procedure Laterality Date   BIOPSY BREAST  1970   Right Breast   COLONOSCOPY     DILATATION & CURETTAGE/HYSTEROSCOPY WITH MYOSURE N/A 03/06/2017   Procedure: DILATATION & CURETTAGE/HYSTEROSCOPY WITH MYOSURE;  Surgeon: Lavoie, Marie-Lyne, MD;  Location: [facility]  [place];  Service: Gynecology;  Laterality: N/A;  Request 7:30am in [place] [facility] block time  requests one hour   DILATATION & CURETTAGE/HYSTEROSCOPY WITH MYOSURE N/A 10/03/2019   Procedure: DILATATION & CURETTAGE/HYSTEROSCOPY WITH MYOSURE;  Surgeon: Lavoie, Marie-Lyne, MD;  Location: [facility] ;  Service: Gynecology;  Laterality: N/A;  request 8:30am OR time in [place] [facility] block requests 45 minutes   right lumpectomy  1970   benign    Current Medications: Current Meds  Medication Sig   atenolol  (TENORMIN ) 25 MG tablet TAKE 1 TABLET (25 MG TOTAL) BY MOUTH DAILY.   irbesartan  (AVAPRO ) 300 MG tablet Take 300 mg by mouth daily.   rosuvastatin  (CRESTOR ) 10 MG tablet Take 1 tablet (10 mg total) by mouth daily at 6 PM.     Allergies:   Amlodipine, Chocolate, Chocolate  flavoring agent (non-screening), Coconut fatty acid, Hydralazine , Hydralazine  hcl, Other, and Peanut-containing drug products   Social History   Socioeconomic History   Marital status: Married    Spouse name: Seena   Number of children: 0   Years of education: 12+   Highest education level: Not on file  Occupational History   Not on file  Tobacco Use   Smoking status: Former    Current packs/day: 0.00    Average packs/day: 1 pack/day for 5.0 years (5.0 ttl pk-yrs)    Types: Cigarettes    Start date: 04/21/1974    Quit date: 04/22/1979    Years since quitting: 44.8   Smokeless tobacco: Never  Vaping Use   Vaping status: Never Used  Substance and Sexual Activity   Alcohol use: No    Alcohol/week: 0.0 standard drinks of alcohol   Drug use: No   Sexual activity: Yes    Partners: Male    Birth control/protection: Post-menopausal    Comment: 1ST Intercourse- 18, partners-  6  Other Topics Concern   Not on file  Social History Narrative   Lives with husband   Caffeine use: none   Right handed   Social Drivers of Corporate Investment Banker Strain: Not on file  Food Insecurity: Not on file  Transportation Needs: Not on file  Physical Activity: Not on file  Stress: Not on file  Social Connections: Not on file     Family History: The patient's family history includes Heart attack in her father; Hypertension in her mother; Stroke in her father. There is no history of Aneurysm.  ROS:   Please see the history of present illness.    ROS  All other systems reviewed and negative.   EKGs/Labs/Other Studies Reviewed:    The following studies were reviewed today: PAP compliance download  Physical Exam:    VS:  BP 134/72   Pulse 66   Ht 5' 3 (1.6 m)   Wt 154 lb 3.2 oz (69.9 kg)   SpO2 99%   BMI 27.32 kg/m     Wt Readings from Last 3 Encounters:  02/24/24 154 lb 3.2 oz (69.9 kg)  02/02/24 156 lb 3.2 oz (70.9 kg)  11/20/23 152 lb (68.9 kg)    GEN: Well nourished,  well developed in no acute distress HEENT: Normal NECK: No JVD; No carotid bruits LYMPHATICS: No lymphadenopathy CARDIAC:RRR, no murmurs, rubs, gallops RESPIRATORY:  Clear to auscultation without rales, wheezing or rhonchi  ABDOMEN: Soft, non-tender, non-distended MUSCULOSKELETAL:  No edema; No deformity  SKIN: Warm and dry NEUROLOGIC:  Alert and oriented x 3 PSYCHIATRIC:  Normal affect   ASSESSMENT:  1. OSA (obstructive sleep apnea)   2. Essential hypertension    PLAN:    In order of problems listed above:  OSA - The patient is tolerating PAP therapy well without any problems. The PAP download performed by his DME was personally reviewed and interpreted by me today and showed an AHI of 2.1 /hr on auto CPAP from 13-20 cm H2O with 90% compliance in using more than 4 hours nightly.  The patient has been using and benefiting from PAP use and will continue to benefit from therapy.  -encouraged her to place a humidifier in her bedroom to prevent running out of H2o in her chamber -encouraged her to adjust the humidity to help with mouth dryness  Hypertension - BP controlled on exam today - Continue atenolol  25 mg daily, irbesartan  300 mg daily and spironolactone 12.5mg  daily with as needed refills - I have personally reviewed and interpreted outside labs performed by patient's PCP which showed serum creatinine 1.47 and potassium 4.6 on 02/12/2024  Time Spent: 20 minutes total time of encounter, including 15 minutes spent in face-to-face patient care on the date of this encounter. This time includes coordination of care and counseling regarding above mentioned problem list. Remainder of non-face-to-face time involved reviewing chart documents/testing relevant to the patient encounter and documentation in the medical record. I have independently reviewed documentation from referring provider  Medication Adjustments/Labs and Tests Ordered: Current medicines are reviewed at length with the  patient today.  Concerns regarding medicines are outlined above.  No orders of the defined types were placed in this encounter.  No orders of the defined types were placed in this encounter.   Signed, Wilbert Bihari, MD  02/24/2024 11:15 AM     [facility]

## 2024-02-24 NOTE — Patient Instructions (Signed)

## 2024-02-26 ENCOUNTER — Encounter (HOSPITAL_COMMUNITY): Payer: Self-pay

## 2024-02-29 ENCOUNTER — Telehealth (HOSPITAL_COMMUNITY): Payer: Self-pay | Admitting: Emergency Medicine

## 2024-02-29 NOTE — Telephone Encounter (Signed)
 Reaching out to patient to offer assistance regarding upcoming cardiac imaging study; pt verbalizes understanding of appt date/time, parking situation and where to check in, pre-test NPO status and medications ordered, and verified current allergies; name and call back number provided for further questions should they arise Rockwell Alexandria RN Navigator Cardiac Imaging [facility] [phone] office [phone] cell

## 2024-03-01 ENCOUNTER — Ambulatory Visit (HOSPITAL_COMMUNITY)
Admission: RE | Admit: 2024-03-01 | Discharge: 2024-03-01 | Disposition: A | Discharge status: Home / Self Care | Source: Ambulatory Visit | Attending: Cardiovascular Disease | Admitting: Cardiovascular Disease

## 2024-03-01 DIAGNOSIS — I2510 Atherosclerotic heart disease of native coronary artery without angina pectoris: Secondary | ICD-10-CM | POA: Insufficient documentation

## 2024-03-01 DIAGNOSIS — R531 Weakness: Secondary | ICD-10-CM | POA: Insufficient documentation

## 2024-03-01 LAB — NM PET CT CARDIAC PERFUSION MULTI W/ABSOLUTE BLOODFLOW
LV dias vol: 83 mL (ref 46–106)
LV sys vol: 34 mL (ref 3.8–5.2)
MBFR: 2.77
Nuc Rest EF: 59 %
Nuc Stress EF: 69 %
Rest MBF: 0.82 ml/g/min
Rest Nuclear Isotope Dose: 18 mCi
ST Depression (mm): 0 mm
Stress MBF: 2.27 ml/g/min
Stress Nuclear Isotope Dose: 18 mCi

## 2024-03-01 MED ORDER — RUBIDIUM RB82 GENERATOR (RUBYFILL)
18.0000 | PACK | Freq: Once | INTRAVENOUS | Status: AC
  Administered 2024-03-01: 18 via INTRAVENOUS

## 2024-03-01 MED ORDER — REGADENOSON 0.4 MG/5ML IV SOLN
0.4000 mg | Freq: Once | INTRAVENOUS | Status: AC
  Administered 2024-03-01: 0.4 mg via INTRAVENOUS

## 2024-03-01 MED ORDER — RUBIDIUM RB82 GENERATOR (RUBYFILL): 18.1000 | PACK | Freq: Once | INTRAVENOUS | Status: DC

## 2024-03-01 MED ORDER — REGADENOSON 0.4 MG/5ML IV SOLN
INTRAVENOUS | Status: AC
  Filled 2024-03-01: qty 5

## 2024-03-01 NOTE — Progress Notes (Signed)
 Pt tolerated lexiscan . Some nausea and weakness in BLE, resolved by end of scan. PO caffeine given. Able to ambulate independently.

## 2024-03-07 DIAGNOSIS — I2510 Atherosclerotic heart disease of native coronary artery without angina pectoris: Secondary | ICD-10-CM | POA: Diagnosis not present

## 2024-03-14 ENCOUNTER — Telehealth (HOSPITAL_BASED_OUTPATIENT_CLINIC_OR_DEPARTMENT_OTHER): Payer: Self-pay

## 2024-03-14 DIAGNOSIS — I10 Essential (primary) hypertension: Secondary | ICD-10-CM | POA: Diagnosis not present

## 2024-03-14 DIAGNOSIS — E7800 Pure hypercholesterolemia, unspecified: Secondary | ICD-10-CM | POA: Diagnosis not present

## 2024-03-14 DIAGNOSIS — I2510 Atherosclerotic heart disease of native coronary artery without angina pectoris: Secondary | ICD-10-CM | POA: Diagnosis not present

## 2024-03-14 DIAGNOSIS — N183 Chronic kidney disease, stage 3 unspecified: Secondary | ICD-10-CM | POA: Diagnosis not present

## 2024-03-14 DIAGNOSIS — H938X3 Other specified disorders of ear, bilateral: Secondary | ICD-10-CM | POA: Diagnosis not present

## 2024-03-14 NOTE — Telephone Encounter (Signed)
   Pre-operative Risk Assessment    Patient Name: Christine Steele  DOB: 1950-10-10 MRN: [id number]   Date of last office visit: 02/02/24 with Dr. Raford Date of next office visit: 04/04/24 with [name]   Request for Surgical Clearance    Procedure:  Hysteroscopy, D&C, Polypectomy, Excision of Vulvar Lesion   Date of Surgery:  Clearance TBD                                 Surgeon:  Dr. [name] Socks Group or Practice Name:  [facility] Phone number:  [phone] Fax number:  [phone]   Type of Clearance Requested:   - Medical    Type of Anesthesia:  Local    Additional requests/questions:  clearance can wait til upcoming appointment 04/04/24   Signed, Augustin JONETTA Daring   03/14/2024, 12:02 PM

## 2024-03-14 NOTE — Telephone Encounter (Signed)
   Name: Christine Steele  DOB: 01-03-1951  MRN: [id number]  Primary Cardiologist: Annabella Scarce, MD  Chart reviewed as part of pre-operative protocol coverage. The patient has an upcoming visit scheduled with Reche Finder on 04/04/2024 at which time clearance can be addressed in case there are any issues that would impact surgical recommendations.  Hysteroscopy, D&C, polypectomy, excision of vulvar lesion is not scheduled until TBD as below. I added preop FYI to appointment note so that provider is aware to address at time of outpatient visit.  Per office protocol the cardiology provider should forward their finalized clearance decision and recommendations regarding antiplatelet therapy to the requesting party below.    I will route this message as FYI to requesting party and remove this message from the preop box as separate preop APP input not needed at this time.   Please call with any questions.  Lum LITTIE Louis, NP  03/14/2024, 12:12 PM

## 2024-03-24 NOTE — Telephone Encounter (Signed)
 Called to discuss PET scan results   Per patient blood pressure has started going back up so she is going to start the Spironolactone  and call back Monday with updated readings.  Keep scheduled f/u 12/15

## 2024-03-25 NOTE — Telephone Encounter (Signed)
 PET scan reassuring. Agree with plan to start Spironolactone  as previously recommended by Dr. Raford.   Altair Stanko S Kylo Gavin, NP

## 2024-03-28 NOTE — Telephone Encounter (Signed)
 Advised patient, verbalized understanding  Asked that she update us  on her blood pressure

## 2024-03-28 NOTE — Telephone Encounter (Signed)
 Left message to call back.

## 2024-04-04 ENCOUNTER — Ambulatory Visit (INDEPENDENT_AMBULATORY_CARE_PROVIDER_SITE_OTHER): Admitting: Family

## 2024-04-04 ENCOUNTER — Encounter (HOSPITAL_BASED_OUTPATIENT_CLINIC_OR_DEPARTMENT_OTHER): Payer: Self-pay | Admitting: Family

## 2024-04-04 VITALS — BP 128/78 | HR 60 | Ht 63.0 in | Wt 157.4 lb

## 2024-04-04 DIAGNOSIS — G4733 Obstructive sleep apnea (adult) (pediatric): Secondary | ICD-10-CM | POA: Diagnosis not present

## 2024-04-04 DIAGNOSIS — I25118 Atherosclerotic heart disease of native coronary artery with other forms of angina pectoris: Secondary | ICD-10-CM

## 2024-04-04 DIAGNOSIS — Z01810 Encounter for preprocedural cardiovascular examination: Secondary | ICD-10-CM | POA: Diagnosis not present

## 2024-04-04 DIAGNOSIS — I10 Essential (primary) hypertension: Secondary | ICD-10-CM

## 2024-04-04 DIAGNOSIS — E785 Hyperlipidemia, unspecified: Secondary | ICD-10-CM | POA: Diagnosis not present

## 2024-04-04 MED ORDER — SPIRONOLACTONE 25 MG PO TABS: 12.5000 mg | ORAL_TABLET | Freq: Every day | ORAL | 3 refills | Status: AC

## 2024-04-04 NOTE — Patient Instructions (Addendum)
 Medication Instructions:  Continue your current medications.  Recommend talkingw ith your doctor who manages your gout prior to starting Aspirin.   *If you need a refill on your cardiac medications before your next appointment, please call your pharmacy*  Lab Work: Your kidney function and potassium looked normal on your labs 03/14/24.   If you have labs (blood work) drawn today and your tests are completely normal, you will receive your results only by: MyChart Message (if you have MyChart) OR A paper copy in the mail If you have any lab test that is abnormal or we need to change your treatment, we will call you to review the results.  Testing/Procedures: Your EKG today was normal which is a reassuring result!  Follow-Up: At [facility], you and your health needs are our priority.  As part of our continuing mission to provide you with exceptional heart care, our providers are all part of one team.  This team includes your primary Cardiologist (physician) and Advanced Practice Providers or APPs (Physician Assistants and Nurse Practitioners) who all work together to provide you with the care you need, when you need it.  Your next appointment:   4 month(s)  Provider:   Annabella Scarce, MD, Rosaline Bane, NP, or Reche Finder, NP    We recommend signing up for the patient portal called MyChart.  Sign up information is provided on this After Visit Summary.  MyChart is used to connect with patients for Virtual Visits (Telemedicine).  Patients are able to view lab/test results, encounter notes, upcoming appointments, etc.  Non-urgent messages can be sent to your provider as well.   To learn more about what you can do with MyChart, go to [email or web address].   Other Instructions  We have sent  a note to Dr. [name] that you are cleared for your surgery.   If you choose magnesium: magnesium glycinate or magnesium oxide 200mg  daily Would avoid magnesium citrate as it is a  natural laxative

## 2024-04-04 NOTE — Progress Notes (Signed)
 Cardiology Office Note   Date:  04/04/2024  ID:  Christine, Steele [birth year], MRN [id number] PCP: Clarice Nottingham, MD  [facility] Providers Cardiologist:  Annabella Scarce, MD     History of Present Illness Christine Steele is a 73 y.o. female with a hx of moderate coronary disease, brain aneurysm, hypertension, OSA on CPAP.   Seen in 2019 for chest discomfort with myoview  03/2018 negative for ischemia. Due to continued atypical symptoms cardiac CTA performed 06/2019 with moderate LAD disease and calcium  score 98th percentile.    Prior adjustments to antihypertensive regimen include discontinuation of HCTZ due to weakness and gout. Transition from Losartan to Irbesartan . Hydralazine  added 08/2019 due to uncontrolled BP but did not tolerate due to tachycardia and Atenolol  instead increased. She completed the PREP program at the [facility] in 2021.   At visit 11/2022 she noted a difficult summer as her elderly mother who she took care of had passed away. Bp was controlled in clinic. Since that time Irbesartan  has been increased to 300mg  daily by PCP team.  At visit 02/02/2024 with Dr. Scarce with elevated BP and doxazosin  felt to be ineffective.  Doxazosin  discontinued and spironolactone  25 mg daily initiated.  Cardiac PET due to coronary calcification and fatigue, weakness performed 03/01/2024 with normal LVEF, no ischemia.  Rosuvastatin  continued and aspirin added.  Presents today for preoperative clearance of hysteroscopy, D&C, polypectomy, excision of vulvar lesion with Dr. [name]. BP at PCP visit 03/14/24 of 132/74. Reports BP at home when checked intermittently have been good. Last night had SBP of 116. She inquires about resuming Aspirin as she has had some gout and swelling in her foot. She is presently taking Allopurinol as it makes her nauseous. Encouraged her to discuss with the provider who manages her gout prior to starting. Reports no chest pain. She has had some dizzy spells where she  can't focus and no spinning that occur sporadically throughout the day at random. No near syncope, syncope. Encouraged her to check BP during one of these episodes. Notes recent dietary indiscretion.   Labs at PCP 03/14/2024 creatinine 0.91, GFR 67, K4.2.  Previous antihypertensives Amldoipine - LE edema Hydralazine  - tachycardia Losartan - changed to irbesartan   ROS: Please see the history of present illness.    All other systems reviewed and are negative.   Studies Reviewed EKG Interpretation Date/Time:  Monday April 04 2024 10:20:27 EST Ventricular Rate:  54 PR Interval:  144 QRS Duration:  82 QT Interval:  434 QTC Calculation: 411 R Axis:   23  Text Interpretation: Sinus bradycardia Confirmed by [name] Mora ([id number]) on 04/04/2024 10:25:41 AM    Cardiac Studies & Procedures   ______________________________________________________________________________________________   STRESS TESTS  NM PET CT CARDIAC PERFUSION MULTI W/ABSOLUTE BLOODFLOW 03/01/2024  Narrative   LV perfusion is normal. There is no evidence of ischemia. There is no evidence of infarction.   Rest left ventricular function is normal. Rest EF: 59%. Stress left ventricular function is normal. Stress EF: 69%. End diastolic cavity size is normal. End systolic cavity size is normal.   Myocardial blood flow was computed to be 0.31ml/g/min at rest and 2.27ml/g/min at stress. Global myocardial blood flow reserve was 2.77 and was normal.   Coronary calcium  was present on the attenuation correction CT images. Severe coronary calcifications were present. Coronary calcifications were present in the left anterior descending artery distribution(s).   The study is low risk.  CLINICAL DATA:  This over-read does not include interpretation of  cardiac or coronary anatomy or pathology. No interpretation the PET data set. The cardiac PET-CT interpretation by the cardiologist is attached.  COMPARISON:  None  Available.  FINDINGS: Limited view of the lung parenchyma demonstrates no suspicious nodularity. Airways are normal.  Limited view of the mediastinum demonstrates no adenopathy. Esophagus normal.  Limited view of the upper abdomen unremarkable.  Limited view of the skeleton and chest wall is unremarkable.  IMPRESSION: No significant extracardiac findings.   Electronically Signed By: Jackquline Boxer M.D. On: 03/01/2024 12:16   ECHOCARDIOGRAM  ECHOCARDIOGRAM COMPLETE 11/14/2009      CT SCANS  CT CORONARY FRACTIONAL FLOW RESERVE DATA PREP 07/11/2019  Narrative CLINICAL DATA:  Chest pain  EXAM: CT FFR  MEDICATIONS: No additional medications.  TECHNIQUE: The coronary CTA was sent for FFR.  FINDINGS: Unable to interpret study due to extent of motion artifact.  Christine Steele   Electronically Signed By: Ezra Shuck M.D. On: 07/11/2019 17:26   CT SCANS  CT CORONARY MORPH W/CTA COR W/SCORE 07/08/2019  Addendum 07/09/2019 12:35 PM ADDENDUM REPORT: 07/09/2019 12:32  CLINICAL DATA:  Chest pain  EXAM: Cardiac CTA  MEDICATIONS: Sub lingual nitro. 4mg  x 2  TECHNIQUE: The patient was scanned on a Siemens 192 slice scanner. Gantry rotation speed was 250 msecs. Collimation was 0.6 mm. A 100 kV prospective scan was triggered in the ascending thoracic aorta at 35-75% of the R-R interval. Average HR during the scan was 60 bpm. The 3D data set was interpreted on a dedicated work station using MPR, MIP and VRT modes. A total of 80cc of contrast was used.  FINDINGS: Non-cardiac: See separate report from [facility].  Pulmonary veins drain normally to the left atrium.  Calcium  Score: 968 Agatston units.  Coronary Arteries: Right dominant with high anterior take-off of the RCA above the right cusp.  LM: Calcified plaque distal left main, mild (<50%) stenosis.  LAD system: Extensive calcified plaque ostial/proximal LAD. There is significant  blooming artifact, very difficult to comment on degree of stenosis. Could be moderate range (51-69%), does not appear to be critical.  Circumflex system: No significant plaque or stenosis.  RCA system: High anterior take-off above the right cusp. Calcified plaque proximal RCA with mild (<50%) stenosis.  IMPRESSION: 1. Coronary artery calcium  score 968 Agatston units. This places the patient in the 98th percentile for age and gender, suggesting high risk for future cardiac events.  2.  Suspect mild proximal RCA stenosis.  3. Extensive calcified plaque in the ostial/proximal LAD. Blooming artifact makes degree of stenosis hard to comment upon. Possible moderate-range stenosis, doubt critical. Will send for FFR.  Christine Steele   Electronically Signed By: Ezra Shuck M.D. On: 07/09/2019 12:32  Narrative EXAM: OVER-READ INTERPRETATION  CT CHEST  The following report is an over-read performed by radiologist Dr. Toribio Aye of [facility] on 07/08/2019. This over-read does not include interpretation of cardiac or coronary anatomy or pathology. The coronary calcium  score/coronary CTA interpretation by the cardiologist is attached.  COMPARISON:  None.  FINDINGS: Aortic atherosclerosis. Within the visualized portions of the thorax there are no suspicious appearing pulmonary nodules or masses, there is no acute consolidative airspace disease, no pleural effusions, no pneumothorax and no lymphadenopathy. Visualized portions of the upper abdomen are unremarkable. There are no aggressive appearing lytic or blastic lesions noted in the visualized portions of the skeleton.  IMPRESSION: 1.  Aortic Atherosclerosis (ICD10-I70.0).  Electronically Signed: By: Toribio Aye M.D. On: 07/08/2019 16:36  ______________________________________________________________________________________________         Risk Assessment/Calculations           Physical  Exam VS:  BP 128/78   Pulse 60   Ht 5' 3 (1.6 m)   Wt 157 lb 6.4 oz (71.4 kg)   SpO2 98%   BMI 27.88 kg/m        Wt Readings from Last 3 Encounters:  04/04/24 157 lb 6.4 oz (71.4 kg)  02/24/24 154 lb 3.2 oz (69.9 kg)  02/02/24 156 lb 3.2 oz (70.9 kg)    GEN: Well nourished, well developed in no acute distress NECK: No JVD; No carotid bruits CARDIAC: RRR, no murmurs, rubs, gallops RESPIRATORY:  Clear to auscultation without rales, wheezing or rhonchi  ABDOMEN: Soft, non-tender, non-distended EXTREMITIES:  No edema; No deformity   ASSESSMENT AND PLAN  Preop - 03/01/24 cardiac PET with no ischemia, normal LVEF. Per AHA/ACC guidelines, she is deemed acceptable risk for the planned procedure without additional cardiovascular testing. Will route to surgical team so they are aware.    Lightheadedness - sporadic episodes of lightheadedness with description of being fuzzy. Encouraged her to  check BP during one of these episodes. Encouraged regular meals, hydration. No near syncope nor syncope. No carotid bruit nor murmur on exam.   HTN  - Reports BP well controlled by home readings. Reassurance provided regarding safety in monitoring of K in Spironolactone . 03/14/24 creatinine 0.91, GFR 67, K4.2. GDMT Atenolol  25mg  daily, irbesartan  300mg  daily, Spironolactone  12.5mg  daily.  Defer increased dose of Atenolol  due to bradycardia.    CAD -low risk PET 03/01/2024.  Stable with no anginal symptoms. No indication for ischemic evaluation.  GDMT includes atenolol  25 mg daily daily, rosuvastatin  10mg  daily. Heart healthy diet and regular cardiovascular exercise encouraged.  Previously self discontinued Aspirin due to perceived as causing gout flares. She will discuss with provider who manages her gout whether to resume.    OSA - CPAP compliance encouraged. Followed by Dr. Shlomo.   HLD - 08/2023 LCL 63. DL goal <29. Continue Rosuvastatin  10mg  QD.       Dispo: follow up in 4  months  Signed, Reche GORMAN Finder, NP

## 2024-05-04 ENCOUNTER — Encounter: Payer: Self-pay | Admitting: Obstetrics and Gynecology

## 2024-05-04 ENCOUNTER — Ambulatory Visit

## 2024-05-04 ENCOUNTER — Ambulatory Visit (HOSPITAL_BASED_OUTPATIENT_CLINIC_OR_DEPARTMENT_OTHER): Admitting: Cardiovascular Disease

## 2024-05-04 ENCOUNTER — Ambulatory Visit: Admitting: Obstetrics and Gynecology

## 2024-05-04 ENCOUNTER — Encounter: Admitting: Obstetrics and Gynecology

## 2024-05-04 VITALS — BP 140/78 | HR 60 | Temp 97.7°F | Wt 160.0 lb

## 2024-05-04 DIAGNOSIS — N898 Other specified noninflammatory disorders of vagina: Secondary | ICD-10-CM | POA: Diagnosis not present

## 2024-05-04 DIAGNOSIS — R9389 Abnormal findings on diagnostic imaging of other specified body structures: Secondary | ICD-10-CM

## 2024-05-04 DIAGNOSIS — D399 Neoplasm of uncertain behavior of female genital organ, unspecified: Secondary | ICD-10-CM

## 2024-05-04 DIAGNOSIS — N83202 Unspecified ovarian cyst, left side: Secondary | ICD-10-CM

## 2024-05-04 DIAGNOSIS — N9089 Other specified noninflammatory disorders of vulva and perineum: Secondary | ICD-10-CM

## 2024-05-04 DIAGNOSIS — N950 Postmenopausal bleeding: Secondary | ICD-10-CM

## 2024-05-04 LAB — URINALYSIS, COMPLETE W/RFL CULTURE
Bacteria, UA: NONE SEEN /HPF
Bilirubin Urine: NEGATIVE
Glucose, UA: NEGATIVE
Hgb urine dipstick: NEGATIVE
Hyaline Cast: NONE SEEN /LPF
Ketones, ur: NEGATIVE
Leukocyte Esterase: NEGATIVE
Nitrites, Initial: NEGATIVE
Protein, ur: NEGATIVE
RBC / HPF: NONE SEEN /HPF (ref 0–2)
Specific Gravity, Urine: 1.01 (ref 1.001–1.035)
WBC, UA: NONE SEEN /HPF (ref 0–5)
pH: 6.5 (ref 5.0–8.0)

## 2024-05-04 LAB — WET PREP FOR TRICH, YEAST, CLUE

## 2024-05-04 LAB — NO CULTURE INDICATED

## 2024-05-04 NOTE — Patient Instructions (Signed)
 Preoperative instructions: Nothing to eat or drink after midnight, unless instructed differently regarding clear liquids by the anesthesia team at [facility]. Do not take any medications on the day of surgery, except those listed below: Irbesartan   Please follow all other instructions as provided by our surgical scheduler at [facility] and the anesthesia team at [facility].  Postoperative instructions: Clean your incision daily with mild soapy water.  Sitz bath are helpful for wound healing.   Ensure that you thoroughly dry your wound following cleaning and keep dry throughout the day.  You can apply a thin layer of Aquaphor or Vaseline over your incision. Ice packs can be applied to your incision for up to 20 minutes at a time to help with pain and swelling.  Take ibuprofen as prescribed and over-the-counter Tylenol  as needed. (DO NOT TAKE IBUPROFEN IF YOU HAVE CONTRAINDICATIONS THAT MAY INCLUDE USE OF BLOOD THINNERS, HISTORY STOMACH SURGERY OR GASTRITIS, CHRONIC KIDNEY DISEASE, ALLERGY, PRIOR INSTRUCTION TO AVOID IBUPROFEN-LIKE MEDICATIONS.)

## 2024-05-04 NOTE — Progress Notes (Signed)
 "  74 y.o. G20P0010 female with postmenopausal bleeding, thickened endometrium, left ovarian cyst, vulvar lesion here for follow-up ultrasound and preoperative exam. Married.  She presents with her husband.  At 09/23/2023 appointment: She reports lump in right breast x 2 weeks, painful to touch. No nipple discharge. Dx BL MMG and US  scheduled 09/29/23, ordered by PCP.  She also reports abnormal cramping and vaginal spotting noticed 1 week ago. No dysuria. No bleeding since last week, however she is still having mild cramping. Of note, she had a diagnostic hysteroscopy in 2021 which revealed a benign endometrial polyp.  She noted genital warts in vaginal area ~2wk ago. She did not see before.  Today, she is doing well.  She is scheduled for diagnostic hysteroscopy, dilation and curettage, possible polypectomy, and excision of vulvar lesion. She denies PMB, weakness, CP or SOB.  Last mammogram: 09/29/2023 BIRADS 1, density C  GYN HISTORY: Prior right lumpectomy  OB History  Gravida Para Term Preterm AB Living  1 0   1   SAB IAB Ectopic Multiple Live Births  1        # Outcome Date GA Lbr Len/2nd Weight Sex Type Anes PTL Lv  1 SAB            Past Medical History:  Diagnosis Date   Aneurysm    2mm Right MCA   Arthritis    Cataract    Bilateral   Chronic kidney disease    Dr. Clarice (PCP)    Hypertension    OSA on CPAP    havent used CPAP in long time per patient due to unable to get filters to change out   Osteopenia 01/2017   T score -1.4 FRAX 3.4% / 0.4%   Tachycardia    Vitamin D deficiency    Past Surgical History:  Procedure Laterality Date   BIOPSY BREAST  1970   Right Breast   COLONOSCOPY     DILATATION & CURETTAGE/HYSTEROSCOPY WITH MYOSURE N/A 03/06/2017   Procedure: DILATATION & CURETTAGE/HYSTEROSCOPY WITH MYOSURE;  Surgeon: Lavoie, Marie-Lyne, MD;  Location: [facility];  Service: Gynecology;  Laterality: N/A;  Request 7:30am in [place] [facility] block  time  requests one hour   DILATATION & CURETTAGE/HYSTEROSCOPY WITH MYOSURE N/A 10/03/2019   Procedure: DILATATION & CURETTAGE/HYSTEROSCOPY WITH MYOSURE;  Surgeon: Lavoie, Marie-Lyne, MD;  Location: [facility] ;  Service: Gynecology;  Laterality: N/A;  request 8:30am OR time in [place] [facility] block requests 45 minutes   right lumpectomy  1970   benign   Current Outpatient Medications on File Prior to Visit  Medication Sig Dispense Refill   allopurinol (ZYLOPRIM) 100 MG tablet Take 100 mg by mouth as needed.     Ascorbic Acid (VITAMIN C PO) Take 1 tablet by mouth daily.     atenolol  (TENORMIN ) 25 MG tablet TAKE 1 TABLET (25 MG TOTAL) BY MOUTH DAILY. 90 tablet 3   Calcium  Carbonate-Vit D-Min (CALCIUM  1200 PO) Take 1 tablet by mouth daily.     Cholecalciferol (VITAMIN D3) 250 MCG (10000 UT) capsule Take 10,000 Units by mouth once a week.     colchicine  0.6 MG tablet Take 1.2mg  (2 tablets) then 0.6mg  (1 tablet) 1 hour after. Then, take 1 tablet every day for 7 days. 10 tablet 2   irbesartan  (AVAPRO ) 300 MG tablet Take 300 mg by mouth daily.     Multiple Vitamin (MULTIVITAMIN WITH MINERALS) TABS tablet Take 1 tablet by mouth daily.     rosuvastatin  (  CRESTOR ) 10 MG tablet Take 1 tablet (10 mg total) by mouth daily at 6 PM. 90 tablet 3   spironolactone  (ALDACTONE ) 25 MG tablet Take 0.5 tablets (12.5 mg total) by mouth daily. 45 tablet 3   No current facility-administered medications on file prior to visit.   Allergies  Allergen Reactions   Amlodipine Swelling and Other (See Comments)    Leg/feet swelling.   Chocolate Other (See Comments)    Headache   Chocolate Flavoring Agent (Non-Screening)     Other reaction(s): headaches   Coconut Fatty Acid Other (See Comments)   Hydralazine  Other (See Comments)    ? Fast heart rate   Hydralazine  Hcl     Other reaction(s): heart rate increase   Other Other (See Comments)    MSG causes headaches   Peanut-Containing Drug Products Other  (See Comments)    All peanuts; headache      PE Today's Vitals   05/04/24 1037 05/04/24 1051  BP: (!) 142/76 (!) 140/78  Pulse: 60   Temp: 97.7 F (36.5 C)   TempSrc: Oral   SpO2: 99%   Weight: 160 lb (72.6 kg)    Body mass index is 28.34 kg/m.  Physical Exam Vitals reviewed. Exam conducted with a chaperone present.  Constitutional:      General: She is not in acute distress.    Appearance: Normal appearance.  HENT:     Head: Normocephalic and atraumatic.     Nose: Nose normal.  Eyes:     Extraocular Movements: Extraocular movements intact.     Conjunctiva/sclera: Conjunctivae normal.  Cardiovascular:     Rate and Rhythm: Normal rate and regular rhythm.     Heart sounds: No murmur heard.    No friction rub. No gallop.  Pulmonary:     Effort: Pulmonary effort is normal. No respiratory distress.     Breath sounds: Normal breath sounds. No stridor. No wheezing, rhonchi or rales.  Genitourinary:    General: Normal vulva.     Exam position: Lithotomy position.     Vagina: Normal. No vaginal discharge.     Cervix: Normal. No cervical motion tenderness, discharge or lesion.     Uterus: Normal. Not enlarged and not tender.      Adnexa: Right adnexa normal and left adnexa normal.      Comments: Warty like growth under clitoral hood. Musculoskeletal:        General: Normal range of motion.     Cervical back: Normal range of motion.  Lymphadenopathy:     Upper Body:     Right upper body: No axillary adenopathy.     Left upper body: No axillary adenopathy.  Neurological:     General: No focal deficit present.     Mental Status: She is alert.  Psychiatric:        Mood and Affect: Mood normal.        Behavior: Behavior normal.   Diagnostic 05/05/2023 TVUS: Findings:    Uterus: 10.6 x 6.0 x 8.0 cm, anteverted uterus.  Multifibroid uterus with 5 fibroids measured.  The largest fibroid measures 3.2 cm. Endometrial thickness: 11 mm, cystic areas present. Left ovary:  7.6 x 6.3 x 5.5 cm, 6.8 cm avascular, complex cyst with 2 cm solid area present. Right ovary: 2.0 x 1.1 x 1.4 cm, normal-appearing. No free fluid.   Impression:  Thickened, cystic endometrium persists. Left ovarian cyst has increased in size since prior ultrasound. Stable uterine fibroids.   Vera LULLA Pa, MD  Assessment and Plan:        Left ovarian cyst Assessment & Plan: TVUS revealed enlarging left complex, avascular cyst of the ovary, when compared to 2021 TVUS and over last 6 months.  Previously 5.7 cm, now 6.7. 10/15/2023 CA125 9, normal Reviewed that given size of the ovary there is a risk for torsion and rupture.  Discussed options for continued surveillance versus left or bilateral salpingo-oophorectomy.  Reviewed laparoscopic BSO or LSO with general endotracheal anesthesia will increase operative time by 1h50min. Reviewed benefits including diagnosis and decreased risk of fallopian tubes and ovary cancers, as well as risks including bleeding, infection, and damage to surrounding organs and vessels.  Reviewed outpatient procedure that will require transportation to hospital and 2 weeks of recovery.   She is undecided about whether or not to add on laparoscopic procedure, however she will call if she would like to add this on in addition to her hysteroscopy and vulvar excision.  Recommend repeat CA-125 and 6 month f/u TVUS if she desires surveillance.  Orders: -     CA 125 -     US  PELVIS TRANSVAGINAL NON-OB (TV ONLY); Future  PMB (postmenopausal bleeding) Thickened endometrium Assessment & Plan: Patient with PMB and TVUS with thickened endometrium suggestive of endometrial polyp. Plan for hysteroscopy, dilation and curettage, polypectomy, excision of vulvar lesion.  Discussed outpatient procedure. Reviewed that  recovery is usually 1-2 days. Risks including infections, bleeding, and damage to surrounding organs reviewed. Recommend NPO prior to midnight and reviewed  medication to take on day of surgery. Dicussed use of NSAIDS as needed for pain postoperatively.  Preop checklist: Antibiotics: none DVT ppx: SCDs Postop visit: 1 week Additional clearance: [x]  PCP clearance 11/03/23 [x]  cards clearance 8/1, 6% risk ,acceptable; 04/04/24 acceptable risk  Vulvar lesion Assessment & Plan: Clitoral wart versus skin tag present Patient desires excision.   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

## 2024-05-05 ENCOUNTER — Ambulatory Visit: Payer: Self-pay | Admitting: Obstetrics and Gynecology

## 2024-05-13 NOTE — Assessment & Plan Note (Addendum)
 TVUS revealed enlarging left complex, avascular cyst of the ovary, when compared to 2021 TVUS and over last 6 months.  Previously 5.7 cm, now 6.7. 10/15/2023 CA125 9, normal Reviewed that given size of the ovary there is a risk for torsion and rupture.  Discussed options for continued surveillance versus left or bilateral salpingo-oophorectomy.  Reviewed laparoscopic BSO or LSO with general endotracheal anesthesia will increase operative time by 1h5min. Reviewed benefits including diagnosis and decreased risk of fallopian tubes and ovary cancers, as well as risks including bleeding, infection, and damage to surrounding organs and vessels.  Reviewed outpatient procedure that will require transportation to hospital and 2 weeks of recovery.   She is undecided about whether or not to add on laparoscopic procedure, however she will call if she would like to add this on in addition to her hysteroscopy and vulvar excision. Recommend repeat CA-125 and 6 month f/u TVUS if she desires surveillance.

## 2024-05-13 NOTE — Assessment & Plan Note (Signed)
 Clitoral wart versus skin tag present Patient desires excision.

## 2024-05-13 NOTE — Assessment & Plan Note (Signed)
 Patient with PMB and TVUS with thickened endometrium suggestive of endometrial polyp. Plan for hysteroscopy, dilation and curettage, polypectomy, excision of vulvar lesion.  Discussed outpatient procedure. Reviewed that  recovery is usually 1-2 days. Risks including infections, bleeding, and damage to surrounding organs reviewed. Recommend NPO prior to midnight and reviewed medication to take on day of surgery. Dicussed use of NSAIDS as needed for pain postoperatively.  Preop checklist: Antibiotics: none DVT ppx: SCDs Postop visit: 1 week Additional clearance: [x]  PCP clearance 11/03/23 [x]  cards clearance 8/1, 6% risk ,acceptable; 04/04/24 acceptable risk

## 2024-05-25 ENCOUNTER — Encounter: Payer: Self-pay | Admitting: *Deleted

## 2024-06-02 ENCOUNTER — Ambulatory Visit (HOSPITAL_COMMUNITY): Admission: RE | Admit: 2024-06-02 | Admitting: Obstetrics and Gynecology

## 2024-06-02 ENCOUNTER — Encounter (HOSPITAL_COMMUNITY): Admission: RE | Payer: Self-pay | Source: Home / Self Care

## 2024-06-09 ENCOUNTER — Encounter: Admitting: Obstetrics and Gynecology

## 2024-08-05 ENCOUNTER — Ambulatory Visit (HOSPITAL_BASED_OUTPATIENT_CLINIC_OR_DEPARTMENT_OTHER): Admitting: Family
